# Patient Record
Sex: Female | Born: 1965
Health system: Southern US, Community
[De-identification: ages and names within clinical notes are randomized; demographics above are authoritative.]

## PROBLEM LIST (undated history)

## (undated) DIAGNOSIS — F419 Anxiety disorder, unspecified: Secondary | ICD-10-CM

## (undated) DIAGNOSIS — R519 Headache, unspecified: Secondary | ICD-10-CM

## (undated) DIAGNOSIS — R51 Headache: Secondary | ICD-10-CM

## (undated) DIAGNOSIS — I639 Cerebral infarction, unspecified: Secondary | ICD-10-CM

## (undated) DIAGNOSIS — I10 Essential (primary) hypertension: Secondary | ICD-10-CM

## (undated) DIAGNOSIS — F32A Depression, unspecified: Secondary | ICD-10-CM

## (undated) DIAGNOSIS — Z8619 Personal history of other infectious and parasitic diseases: Secondary | ICD-10-CM

## (undated) DIAGNOSIS — F329 Major depressive disorder, single episode, unspecified: Secondary | ICD-10-CM

## (undated) HISTORY — DX: Headache, unspecified: R51.9

## (undated) HISTORY — PX: FOOT SURGERY: SHX648

## (undated) HISTORY — DX: Anxiety disorder, unspecified: F41.9

## (undated) HISTORY — DX: Major depressive disorder, single episode, unspecified: F32.9

## (undated) HISTORY — DX: Headache: R51

## (undated) HISTORY — DX: Essential (primary) hypertension: I10

## (undated) HISTORY — DX: Personal history of other infectious and parasitic diseases: Z86.19

## (undated) HISTORY — DX: Depression, unspecified: F32.A

---

## 2009-05-02 ENCOUNTER — Ambulatory Visit: Payer: Self-pay | Admitting: Unknown Physician Specialty

## 2009-05-06 ENCOUNTER — Ambulatory Visit: Payer: Self-pay | Admitting: Unknown Physician Specialty

## 2017-03-18 ENCOUNTER — Ambulatory Visit: Payer: Self-pay

## 2017-03-18 NOTE — Telephone Encounter (Signed)
  Reason for Disposition . [1] Symptoms of anxiety or panic AND [2] has not been evaluated for this by physician  Answer Assessment - Initial Assessment Questions 1. CONCERN: "What happened that made you call today?"     PANIC today 2. ANXIETY SYMPTOM SCREENING: "Can you describe how you have been feeling?"  (e.g., tense, restless, panicky, anxious, keyed up, trouble sleeping, trouble concentrating)     Anxiety, shaking 3. ONSET: "How long have you been feeling this way?"    Saturday 4. RECURRENT: "Have you felt this way before?"  If yes: "What happened that time?" "What helped these feelings go away in the past?"      Yes - January 2018 5. RISK OF HARM - SUICIDAL IDEATION:  "Do you ever have thoughts of hurting or killing yourself?"  (e.g., yes, no, no but preoccupation with thoughts about death)   - INTENT:  "Do you have thoughts of hurting or killing yourself right NOW?" (e.g., yes, no, N/A)   - PLAN: "Do you have a specific plan for how you would do this?" (e.g., gun, knife, overdose, no plan, N/A)     No 6. RISK OF HARM - HOMICIDAL IDEATION:  "Do you ever have thoughts of hurting or killing someone else?"  (e.g., yes, no, no but preoccupation with thoughts about death)   - INTENT:  "Do you have thoughts of hurting or killing someone right NOW?" (e.g., yes, no, N/A)   - PLAN: "Do you have a specific plan for how you would do this?" (e.g., gun, knife, no plan, N/A)      No 7. FUNCTIONAL IMPAIRMENT: "How have things been going for you overall in your life? Have you had any more difficulties than usual doing your normal daily activities?"  (e.g., better, same, worse; self-care, school, work, interactions)     Yes 8. SUPPORT: "Who is with you now?" "Who do you live with?" "Do you have family or friends nearby who you can talk to?"      Lives with children 9. THERAPIST: "Do you have a counselor or therapist? Name?"     No one now 10. STRESSORS: "Has there been any new stress or recent  changes in your life?"       Work 11. CAFFEINE ABUSE: "Do you drink caffeinated beverages, and how much each day?" (e.g., coffee, tea, colas)       No 12. SUBSTANCE ABUSE: "Do you use any illegal drugs or alcohol?"       Alcohol occassionally 13. OTHER SYMPTOMS: "Do you have any other physical symptoms right now?" (e.g., chest pain, palpitations, difficulty breathing, fever)       Tremors, crying, not sleeping well 14. PREGNANCY: "Is there any chance you are pregnant?" "When was your last menstrual period?"       No  Protocols used: ANXIETY AND PANIC ATTACK-A-AH

## 2017-03-20 ENCOUNTER — Encounter (INDEPENDENT_AMBULATORY_CARE_PROVIDER_SITE_OTHER): Payer: Self-pay

## 2017-03-20 ENCOUNTER — Telehealth: Payer: Self-pay

## 2017-03-20 ENCOUNTER — Other Ambulatory Visit: Payer: Self-pay | Admitting: Family Medicine

## 2017-03-20 ENCOUNTER — Encounter: Payer: Self-pay | Admitting: Family Medicine

## 2017-03-20 ENCOUNTER — Ambulatory Visit (INDEPENDENT_AMBULATORY_CARE_PROVIDER_SITE_OTHER): Payer: 59 | Admitting: Family Medicine

## 2017-03-20 VITALS — BP 146/92 | HR 110 | Temp 98.1°F | Wt 97.0 lb

## 2017-03-20 DIAGNOSIS — Z7689 Persons encountering health services in other specified circumstances: Secondary | ICD-10-CM | POA: Diagnosis not present

## 2017-03-20 DIAGNOSIS — D582 Other hemoglobinopathies: Secondary | ICD-10-CM

## 2017-03-20 DIAGNOSIS — F329 Major depressive disorder, single episode, unspecified: Secondary | ICD-10-CM | POA: Diagnosis not present

## 2017-03-20 DIAGNOSIS — E876 Hypokalemia: Secondary | ICD-10-CM

## 2017-03-20 DIAGNOSIS — R7989 Other specified abnormal findings of blood chemistry: Secondary | ICD-10-CM

## 2017-03-20 DIAGNOSIS — R232 Flushing: Secondary | ICD-10-CM | POA: Diagnosis not present

## 2017-03-20 DIAGNOSIS — F419 Anxiety disorder, unspecified: Secondary | ICD-10-CM

## 2017-03-20 DIAGNOSIS — R002 Palpitations: Secondary | ICD-10-CM

## 2017-03-20 DIAGNOSIS — R945 Abnormal results of liver function studies: Secondary | ICD-10-CM

## 2017-03-20 DIAGNOSIS — E559 Vitamin D deficiency, unspecified: Secondary | ICD-10-CM

## 2017-03-20 LAB — CBC
HEMATOCRIT: 48.2 % — AB (ref 36.0–46.0)
HEMOGLOBIN: 16.7 g/dL — AB (ref 12.0–15.0)
MCHC: 34.8 g/dL (ref 30.0–36.0)
MCV: 109.6 fl — ABNORMAL HIGH (ref 78.0–100.0)
PLATELETS: 181 10*3/uL (ref 150.0–400.0)
RBC: 4.4 Mil/uL (ref 3.87–5.11)
RDW: 12.6 % (ref 11.5–15.5)
WBC: 8 10*3/uL (ref 4.0–10.5)

## 2017-03-20 LAB — COMPREHENSIVE METABOLIC PANEL
ALT: 88 U/L — ABNORMAL HIGH (ref 0–35)
AST: 222 U/L — ABNORMAL HIGH (ref 0–37)
Albumin: 4.4 g/dL (ref 3.5–5.2)
Alkaline Phosphatase: 150 U/L — ABNORMAL HIGH (ref 39–117)
BILIRUBIN TOTAL: 0.9 mg/dL (ref 0.2–1.2)
BUN: 17 mg/dL (ref 6–23)
CO2: 32 mEq/L (ref 19–32)
Calcium: 9.9 mg/dL (ref 8.4–10.5)
Chloride: 91 mEq/L — ABNORMAL LOW (ref 96–112)
Creatinine, Ser: 0.68 mg/dL (ref 0.40–1.20)
GFR: 96.95 mL/min (ref >60.00)
Glucose, Bld: 128 mg/dL — ABNORMAL HIGH (ref 70–99)
POTASSIUM: 2.7 meq/L — AB (ref 3.5–5.1)
Sodium: 137 mEq/L (ref 135–145)
TOTAL PROTEIN: 7.5 g/dL (ref 6.0–8.3)

## 2017-03-20 LAB — VITAMIN D 25 HYDROXY (VIT D DEFICIENCY, FRACTURES): VITD: 14.32 ng/mL — ABNORMAL LOW (ref 30.00–100.00)

## 2017-03-20 LAB — FOLLICLE STIMULATING HORMONE: FSH: 104.2 m[IU]/mL

## 2017-03-20 LAB — TSH: TSH: 1.99 u[IU]/mL (ref 0.35–4.50)

## 2017-03-20 LAB — VITAMIN B12: VITAMIN B 12: 225 pg/mL (ref 211–911)

## 2017-03-20 MED ORDER — POTASSIUM CHLORIDE CRYS ER 20 MEQ PO TBCR: 20.0000 meq | EXTENDED_RELEASE_TABLET | Freq: Two times a day (BID) | ORAL | 0 refills | Status: DC

## 2017-03-20 MED ORDER — ESCITALOPRAM OXALATE 10 MG PO TABS: 10.0000 mg | ORAL_TABLET | Freq: Every day | ORAL | 2 refills | Status: AC

## 2017-03-20 MED ORDER — CLONAZEPAM 0.5 MG PO TABS: 0.5000 mg | ORAL_TABLET | Freq: Two times a day (BID) | ORAL | 1 refills | Status: AC | PRN

## 2017-03-20 MED ORDER — CHOLECALCIFEROL 125 MCG (5000 UT) PO CAPS: 5000.0000 [IU] | ORAL_CAPSULE | Freq: Every day | ORAL | 5 refills | Status: AC

## 2017-03-20 NOTE — Patient Instructions (Addendum)
It was a pleasure to meet you today! I look forward to partnering with you for your health care needs  Please follow up for complete physical exam in 4-8 weeks, sooner if symptoms worsen   [facility] Rocky LinkKen Frazier-336- [phone] Karmen BongoAaron Stewart- [phone] Heather Kitchen- [phone] Mike CrazeKarla Townsend- [phone] Shanon RosserBarbara Farran- [phone] Berniece AndreasJulie Whitt- [phone] Vernie AmmonsClaire Hubrich [phone] Marjie SkiffLauren Atkinson [phone] Salomon Fickerri Bauert [place]) [phone] [place]- brochure  .  Generalized Anxiety Disorder, Adult Generalized anxiety disorder (GAD) is a mental health disorder. People with this condition constantly worry about everyday events. Unlike normal anxiety, worry related to GAD is not triggered by a specific event. These worries also do not fade or get better with time. GAD interferes with life functions, including relationships, work, and school. GAD can vary from mild to severe. People with severe GAD can have intense waves of anxiety with physical symptoms (panic attacks). What are the causes? The exact cause of GAD is not known. What increases the risk? This condition is more likely to develop in:  Women.  People who have a family history of anxiety disorders.  People who are very shy.  People who experience very stressful life events, such as the death of a loved one.  People who have a very stressful family environment.  What are the signs or symptoms? People with GAD often worry excessively about many things in their lives, such as their health and family. They may also be overly concerned about:  Doing well at work.  Being on time.  Natural disasters.  Friendships.  Physical symptoms of GAD include:  Fatigue.  Muscle tension or having muscle twitches.  Trembling or feeling shaky.  Being easily startled.  Feeling like your heart is pounding or racing.  Feeling out of breath or like you cannot take a deep breath.  Having  trouble falling asleep or staying asleep.  Sweating.  Nausea, diarrhea, or irritable bowel syndrome (IBS).  Headaches.  Trouble concentrating or remembering facts.  Restlessness.  Irritability.  How is this diagnosed? Your health care provider can diagnose GAD based on your symptoms and medical history. You will also have a physical exam. The health care provider will ask specific questions about your symptoms, including how severe they are, when they started, and if they come and go. Your health care provider may ask you about your use of alcohol or drugs, including prescription medicines. Your health care provider may refer you to a mental health specialist for further evaluation. Your health care provider will do a thorough examination and may perform additional tests to rule out other possible causes of your symptoms. To be diagnosed with GAD, a person must have anxiety that:  Is out of his or her control.  Affects several different aspects of his or her life, such as work and relationships.  Causes distress that makes him or her unable to take part in normal activities.  Includes at least three physical symptoms of GAD, such as restlessness, fatigue, trouble concentrating, irritability, muscle tension, or sleep problems.  Before your health care provider can confirm a diagnosis of GAD, these symptoms must be present more days than they are not, and they must last for six months or longer. How is this treated? The following therapies are usually used to treat GAD:  Medicine. Antidepressant medicine is usually prescribed for long-term daily control. Antianxiety medicines may be added in severe cases, especially when panic attacks occur.  Talk therapy (psychotherapy). Certain types of talk therapy can be helpful in  treating GAD by providing support, education, and guidance. Options include: ? Cognitive behavioral therapy (CBT). People learn coping skills and techniques to ease  their anxiety. They learn to identify unrealistic or negative thoughts and behaviors and to replace them with positive ones. ? Acceptance and commitment therapy (ACT). This treatment teaches people how to be mindful as a way to cope with unwanted thoughts and feelings. ? Biofeedback. This process trains you to manage your body's response (physiological response) through breathing techniques and relaxation methods. You will work with a therapist while machines are used to monitor your physical symptoms.  Stress management techniques. These include yoga, meditation, and exercise.  A mental health specialist can help determine which treatment is best for you. Some people see improvement with one type of therapy. However, other people require a combination of therapies. Follow these instructions at home:  Take over-the-counter and prescription medicines only as told by your health care provider.  Try to maintain a normal routine.  Try to anticipate stressful situations and allow extra time to manage them.  Practice any stress management or self-calming techniques as taught by your health care provider.  Do not punish yourself for setbacks or for not making progress.  Try to recognize your accomplishments, even if they are small.  Keep all follow-up visits as told by your health care provider. This is important. Contact a health care provider if:  Your symptoms do not get better.  Your symptoms get worse.  You have signs of depression, such as: ? A persistently sad, cranky, or irritable mood. ? Loss of enjoyment in activities that used to bring you joy. ? Change in weight or eating. ? Changes in sleeping habits. ? Avoiding friends or family members. ? Loss of energy for normal tasks. ? Feelings of guilt or worthlessness. Get help right away if:  You have serious thoughts about hurting yourself or others. If you ever feel like you may hurt yourself or others, or have thoughts about  taking your own life, get help right away. You can go to your nearest emergency department or call:  Your local emergency services (911 in the [place]  A suicide crisis helpline, such as the National Suicide Prevention Lifeline at (469)724-94911-(424)849-0612. This is open 24 hours a day.  Summary  Generalized anxiety disorder (GAD) is a mental health disorder that involves worry that is not triggered by a specific event.  People with GAD often worry excessively about many things in their lives, such as their health and family.  GAD may cause physical symptoms such as restlessness, trouble concentrating, sleep problems, frequent sweating, nausea, diarrhea, headaches, and trembling or muscle twitching.  A mental health specialist can help determine which treatment is best for you. Some people see improvement with one type of therapy. However, other people require a combination of therapies. This information is not intended to replace advice given to you by your health care provider. Make sure you discuss any questions you have with your health care provider. Document Released: 08/11/2012 Document Revised: 03/06/2016 Document Reviewed: 03/06/2016 Elsevier Interactive Patient Education  [employer]2018 Elsevier Inc.
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
## 2017-03-20 NOTE — Telephone Encounter (Signed)
[facility] called with Critical Low Potassium of 2.7

## 2017-03-22 ENCOUNTER — Encounter: Payer: Self-pay | Admitting: Family Medicine

## 2017-03-25 ENCOUNTER — Telehealth: Payer: Self-pay | Admitting: Family Medicine

## 2017-03-25 NOTE — Telephone Encounter (Signed)
Tried calling pt   Paperwork is ready for pick up paperwork has been faxed

## 2017-03-26 ENCOUNTER — Ambulatory Visit: Payer: Self-pay | Admitting: Unknown Physician Specialty

## 2017-03-27 NOTE — Telephone Encounter (Signed)
Copy for pt °Copy for file °Copy for scan °

## 2017-04-01 ENCOUNTER — Telehealth: Payer: Self-pay | Admitting: *Deleted

## 2017-04-01 NOTE — Telephone Encounter (Signed)
Please call patient and tell her that it is imperative that she come in for repeat blood work due to critical values the other week. The orders are already in, please make her a lab only visit for today or tomorrow morning. She will need an appointment to further discuss FMLA extension.

## 2017-04-01 NOTE — Telephone Encounter (Signed)
Copied from CRM [id number]. Topic: Quick Communication - See Telephone Encounter >> Apr 01, 2017 11:24 AM Louie BunPalacios Medina, Rosey Batheresa D wrote: CRM for notification. See Telephone encounter for: 04/01/17. Patient called and would like for provider to extend her FMLA. She would like to talk to someone about this. Please call patient back, thanks.

## 2017-04-01 NOTE — Telephone Encounter (Signed)
Spoken and notified patient of Debbie's comments. Patient verbalized understanding.  Lab appt on 04/02/2017. Office visit on 04/05/2017

## 2017-04-02 ENCOUNTER — Other Ambulatory Visit (INDEPENDENT_AMBULATORY_CARE_PROVIDER_SITE_OTHER): Payer: 59

## 2017-04-02 ENCOUNTER — Other Ambulatory Visit: Payer: 59

## 2017-04-02 DIAGNOSIS — E876 Hypokalemia: Secondary | ICD-10-CM

## 2017-04-02 DIAGNOSIS — R7989 Other specified abnormal findings of blood chemistry: Secondary | ICD-10-CM

## 2017-04-02 DIAGNOSIS — D582 Other hemoglobinopathies: Secondary | ICD-10-CM | POA: Diagnosis not present

## 2017-04-02 DIAGNOSIS — R945 Abnormal results of liver function studies: Secondary | ICD-10-CM

## 2017-04-02 LAB — BASIC METABOLIC PANEL
BUN: 6 mg/dL (ref 6–23)
CALCIUM: 9.5 mg/dL (ref 8.4–10.5)
CO2: 25 mEq/L (ref 19–32)
Chloride: 102 mEq/L (ref 96–112)
Creatinine, Ser: 0.6 mg/dL (ref 0.40–1.20)
GFR: 112 mL/min (ref >60.00)
GLUCOSE: 121 mg/dL — AB (ref 70–99)
Potassium: 4.8 mEq/L (ref 3.5–5.1)
SODIUM: 135 meq/L (ref 135–145)

## 2017-04-02 LAB — IBC PANEL
Iron: 125 ug/dL (ref 42–145)
SATURATION RATIOS: 43.8 % (ref 20.0–50.0)
TRANSFERRIN: 204 mg/dL — AB (ref 212.0–360.0)

## 2017-04-03 NOTE — Addendum Note (Signed)
Addended by: Alvina ChouWALSH, TERRI J on: 04/03/2017 10:20 AM   Modules accepted: Orders

## 2017-04-05 ENCOUNTER — Telehealth: Payer: Self-pay | Admitting: Family Medicine

## 2017-04-05 ENCOUNTER — Ambulatory Visit (INDEPENDENT_AMBULATORY_CARE_PROVIDER_SITE_OTHER): Payer: 59 | Admitting: Family Medicine

## 2017-04-05 ENCOUNTER — Encounter: Payer: Self-pay | Admitting: Family Medicine

## 2017-04-05 VITALS — BP 190/110 | HR 94 | Temp 98.4°F | Wt 100.2 lb

## 2017-04-05 DIAGNOSIS — F419 Anxiety disorder, unspecified: Secondary | ICD-10-CM | POA: Diagnosis not present

## 2017-04-05 DIAGNOSIS — F329 Major depressive disorder, single episode, unspecified: Secondary | ICD-10-CM | POA: Diagnosis not present

## 2017-04-05 DIAGNOSIS — F32A Depression, unspecified: Secondary | ICD-10-CM

## 2017-04-05 DIAGNOSIS — R945 Abnormal results of liver function studies: Secondary | ICD-10-CM

## 2017-04-05 DIAGNOSIS — I10 Essential (primary) hypertension: Secondary | ICD-10-CM

## 2017-04-05 DIAGNOSIS — R7989 Other specified abnormal findings of blood chemistry: Secondary | ICD-10-CM

## 2017-04-05 LAB — TEST AUTHORIZATION

## 2017-04-05 LAB — HEPATIC FUNCTION PANEL
AG RATIO: 1.4 (calc) (ref 1.0–2.5)
ALKALINE PHOSPHATASE (APISO): 125 U/L (ref 33–130)
ALT: 63 U/L — AB (ref 6–29)
AST: 58 U/L — AB (ref 10–35)
Albumin: 3.9 g/dL (ref 3.6–5.1)
Bilirubin, Direct: 0 mg/dL (ref 0.0–0.2)
Globulin: 2.8 g/dL (calc) (ref 1.9–3.7)
Indirect Bilirubin: 0.2 mg/dL (calc) (ref 0.2–1.2)
TOTAL PROTEIN: 6.7 g/dL (ref 6.1–8.1)
Total Bilirubin: 0.2 mg/dL (ref 0.2–1.2)

## 2017-04-05 LAB — REFLEX TIQ

## 2017-04-05 LAB — ACUTE HEP PANEL AND HEP B SURFACE AB
HEP A IGM: NONREACTIVE
HEPATITIS C ANTIBODY REFILL: NONREACTIVE
Hep B C IgM: NONREACTIVE
Hepatitis B Surface Ag: NONREACTIVE
SIGNAL TO CUT-OFF: 0.01 (ref <1.00)

## 2017-04-05 MED ORDER — LISINOPRIL 10 MG PO TABS: 10.0000 mg | ORAL_TABLET | Freq: Every day | ORAL | 3 refills | Status: DC

## 2017-04-05 NOTE — Telephone Encounter (Signed)
[facility] and returned to [facility]ri Bender.

## 2017-04-05 NOTE — Telephone Encounter (Signed)
Noted.    Copied from CRM [id number]. Topic: General - Other >> Apr 05, 2017  2:22 PM Arlyss Gandyichardson, Taren N, NT wrote: Reason for CRM: Patient states she tried to call [facility] and they close at 1pm on Friday so she will call next week to set up an appt.

## 2017-04-05 NOTE — Telephone Encounter (Signed)
Paperwork printed in Terri Bender's in basket   Terri Bender was in today and we are going to extend her FMLA, will you please print out from [employer] and start a telephone encounter?  Thanks,  Ameren CorporationDebbie

## 2017-04-05 NOTE — Patient Instructions (Addendum)
Please call [facility] and tell them you have a referral and need an appointment

## 2017-04-05 NOTE — Progress Notes (Signed)
Subjective:    Patient ID: Terri Bender, female    DOB: [birth year], 51 y.o.   MRN: [id number]  HPI This is a 51 yo female who presents today for follow up of anxiety. She was seen 03/20/17 as a new patient. She had not had any medical care in several years.  She did not get prescriptions for lexapro and vit d3- not sure if prescriptions went to pharmacy? They were sent when she was seen 03/20/17.  She did not schedule appointment with therapist as was advised at last visit.  Today she requests that her FMLA be extended.  She states that she is unable to concentrate and function to be able to do her job.  She reports that she is feeling a little less anxious and is sleeping a little better but feels that she needs additional time to pull herself together.  For a while she was unable to share care of her teenage children with her ex-husband, but has just this week been able to spend time with them.  She has been using clonazepam a couple of times a week with good relief of symptoms.  LFTs found to be very elevated last month. Had been drinking multiple glasses of wine daily about a month and a half ago, she reports she is not currently drinking.   HTN-she has history of hypertension and was previously on medication.  She has been off of it for several years.  She denies headaches, chest pain, pedal edema.  Does feel her heart race some with anxiety.  Past Medical History:  Diagnosis Date  . Anxiety   . Depression   . Frequent headaches   . History of chicken pox   . Hypertension    Past Surgical History:  Procedure Laterality Date  . CESAREAN SECTION    . FOOT SURGERY Left    bone spur   Family History  Problem Relation Age of Onset  . Heart disease Father   . Diabetes Paternal Grandmother   . Heart disease Paternal Grandfather       Review of Systems Per HPI    Objective:   Physical Exam  Constitutional: She is oriented to person, place, and time. She appears well-developed  and well-nourished.  HENT:  Head: Normocephalic and atraumatic.  Eyes: Conjunctivae are normal.  Cardiovascular: Normal rate.  Pulmonary/Chest: Effort normal.  Neurological: She is alert and oriented to person, place, and time.  Skin:  Color improved today.  Psychiatric: Her behavior is normal. Judgment and thought content normal. Her mood appears anxious.  Appears slightly less anxious today, is less tearful.  Vitals reviewed.     BP (!) 198/118 (BP Location: Left Arm, Patient Position: Sitting, Cuff Size: Normal)   Pulse 94   Temp 98.4 F (36.9 C) (Oral)   Wt 100 lb 4 oz (45.5 kg)   SpO2 99%  Wt Readings from Last 3 Encounters:  04/05/17 100 lb 4 oz (45.5 kg)  03/20/17 97 lb (44 kg)   BP Readings from Last 3 Encounters:  04/05/17 (!) 198/118  03/20/17 (!) 146/92  Recheck BP- 190/110    Assessment & Plan:  1. Anxiety and depression -We will call pharmacy to make sure escitalopram went through and encouraged her to pick that up and to start immediately -Told her that I would not continue to write her out for FMLA without her seeing psychiatry, I think she needs additional treatment/counseling. - Ambulatory referral to Psychiatry  2. Essential hypertension - lisinopril (  PRINIVIL,ZESTRIL) 10 MG tablet; Take 1 tablet (10 mg total) by mouth daily.  Dispense: 90 tablet; Refill: 3 -Follow-up in 2 weeks for complete physical exam as scheduled, will recheck blood pressure and BMET at that time  3. Elevated LFTs -Improved on recheck, strongly encouraged her to avoid all alcohol  Olean Reeeborah Kian Ottaviano, FNP-BC  [facility]  04/09/2017 9:46 AM

## 2017-04-09 ENCOUNTER — Encounter: Payer: Self-pay | Admitting: Family Medicine

## 2017-04-10 NOTE — Telephone Encounter (Signed)
Paperwork faxed °

## 2017-04-10 NOTE — Telephone Encounter (Signed)
Tried call pt no voice mail set up

## 2017-04-17 ENCOUNTER — Encounter: Payer: Self-pay | Admitting: Family Medicine

## 2017-04-17 ENCOUNTER — Other Ambulatory Visit (HOSPITAL_COMMUNITY)
Admission: RE | Admit: 2017-04-17 | Discharge: 2017-04-17 | Disposition: A | Discharge status: Home / Self Care | Payer: 59 | Source: Ambulatory Visit | Attending: Family Medicine | Admitting: Family Medicine

## 2017-04-17 ENCOUNTER — Ambulatory Visit (INDEPENDENT_AMBULATORY_CARE_PROVIDER_SITE_OTHER): Payer: 59 | Admitting: Family Medicine

## 2017-04-17 VITALS — BP 118/90 | HR 127 | Temp 97.8°F | Ht 62.0 in | Wt 95.2 lb

## 2017-04-17 DIAGNOSIS — F419 Anxiety disorder, unspecified: Secondary | ICD-10-CM

## 2017-04-17 DIAGNOSIS — Z124 Encounter for screening for malignant neoplasm of cervix: Secondary | ICD-10-CM

## 2017-04-17 DIAGNOSIS — R87610 Atypical squamous cells of undetermined significance on cytologic smear of cervix (ASC-US): Secondary | ICD-10-CM | POA: Insufficient documentation

## 2017-04-17 DIAGNOSIS — I10 Essential (primary) hypertension: Secondary | ICD-10-CM | POA: Diagnosis not present

## 2017-04-17 DIAGNOSIS — F329 Major depressive disorder, single episode, unspecified: Secondary | ICD-10-CM

## 2017-04-17 DIAGNOSIS — Z0000 Encounter for general adult medical examination without abnormal findings: Secondary | ICD-10-CM

## 2017-04-17 NOTE — Progress Notes (Signed)
Subjective:    Patient ID: Terri Bender, female    DOB: 10/22/1965, 51 y.o.   MRN: [id number]  HPI This is a 51 yo female who presents for CPE.    Anxiety and depression- has appointment with therapist 05/01/16- has been delayed due to snow. Has been on leave and this goes until 04/19/17. She requests leave be extended until 05/06/16. Panic attacks have improved, she did have one this morning and her daughter slipped on the stairs.  Her daughter was fine but the patient felt her heart racing and she felt panicky. She was started on escitalopram 03/20/17 but there was some confusion at pharmacy and she didn't pick it up until 04/05/17. She states she is tolerating well. Rarely using clonazepam.   HTN- was started on lisinopril last visit.  She denies any side effects.    Last CPE- a couple of years ago Mammo- never Pap- 2014 Colonoscopy- never Tdap- unknown Flu- declines Eye- 1.5 years ago Dental- not regular Exercise- not regular   Past Medical History:  Diagnosis Date  . Anxiety   . Depression   . Frequent headaches   . History of chicken pox   . Hypertension    Past Surgical History:  Procedure Laterality Date  . CESAREAN SECTION    . FOOT SURGERY Left    bone spur   Family History  Problem Relation Age of Onset  . Heart disease Father   . Diabetes Paternal Grandmother   . Heart disease Paternal Grandfather    Social History   Tobacco Use  . Smoking status: Current Every Day Smoker    Packs/day: 0.50  . Smokeless tobacco: Never Used  Substance Use Topics  . Alcohol use: Yes    Comment: occasional  . Drug use: No     Review of Systems  Constitutional: Negative for fever and unexpected weight change.  HENT: Negative.   Eyes: Negative.   Respiratory: Negative for cough, shortness of breath and wheezing.   Cardiovascular: Positive for palpitations (with panic attacks). Negative for chest pain and leg swelling.  Genitourinary: Negative.   Musculoskeletal:  Positive for back pain (Intermittent).  Allergic/Immunologic: Negative.   Neurological: Negative for dizziness, light-headedness and headaches.  Psychiatric/Behavioral: Positive for dysphoric mood and sleep disturbance. The patient is nervous/anxious.        Objective:   Physical Exam Physical Exam  Constitutional: She is oriented to person, place, and time. She appears well-developed and well-nourished. No distress.  HENT:  Head: Normocephalic and atraumatic.  Right Ear: External ear normal.  Left Ear: External ear normal.  Nose: Nose normal.  Mouth/Throat: Oropharynx is clear and moist. No oropharyngeal exudate.  Eyes: Conjunctivae are normal. Pupils are equal, round, and reactive to light.  Neck: Normal range of motion. Neck supple. No JVD present. No thyromegaly present.  Cardiovascular: Normal rate, regular rhythm, normal heart sounds and intact distal pulses.   Pulmonary/Chest: Effort normal and breath sounds normal. Right breast exhibits no inverted nipple, no mass, no nipple discharge, no skin change and no tenderness. Left breast exhibits no inverted nipple, no mass, no nipple discharge, no skin change and no tenderness. Breasts are symmetrical.  Abdominal: Soft. Bowel sounds are normal. She exhibits no distension and no mass. There is no tenderness. There is no rebound and no guarding.  Genitourinary: Vagina normal. Pelvic exam was performed with patient supine. There is no rash, tenderness, lesion or injury on the right labia. There is no rash, tenderness, lesion or injury  on the left labia. Cervix exhibits no motion tenderness and no discharge. No vaginal discharge found.  Musculoskeletal: Normal range of motion. She exhibits no edema or tenderness.  Lymphadenopathy:    She has no cervical adenopathy.  Neurological: She is alert and oriented to person, place, and time. She has normal reflexes.  Skin: Skin is warm and dry. She is not diaphoretic.  Psychiatric: She has a normal  mood and affect. Her behavior is normal. Judgment and thought content normal. Appears mildly anxious.  Vitals reviewed.     BP 118/90 (BP Location: Right Arm, Patient Position: Sitting, Cuff Size: Normal)   Pulse (!) 127   Temp 97.8 F (36.6 C) (Oral)   Ht 5\' 2"  (1.575 m)   Wt 95 lb 4 oz (43.2 kg)   SpO2 98%   BMI 17.42 kg/m      Wt Readings from Last 3 Encounters:  04/17/17 95 lb 4 oz (43.2 kg)  04/05/17 100 lb 4 oz (45.5 kg)  03/20/17 97 lb (44 kg)   BP Readings from Last 3 Encounters:  04/17/17 118/90  04/05/17 (!) 190/110  03/20/17 (!) 146/92     Assessment & Plan:  1. Annual physical exam - Discussed and encouraged healthy lifestyle choices- adequate sleep, regular exercise, stress management and healthy food choices.   2. Screening for cervical cancer - Cytology - PAP  3. Essential hypertension -Blood pressure significantly improved today, continue lisinopril  4. Anxiety and depression -She is noted some improvement but has appointment with therapist in a couple of weeks, I feel that it is reasonable to extend her FMLA until she can be seen for therapy  -Follow-up in 1 month for repeat BMP and LFTs   Olean Reeeborah Karianna Gusman, FNP-BC  [facility], [facility]  04/17/2017 1:59 PM

## 2017-04-17 NOTE — Patient Instructions (Signed)
Please call and schedule an appointment for screening mammogram. A referral is not needed-  [phone]- [phone]  Please follow up in 1 month to recheck labs and see how you are doing  Keeping You Healthy  Get These Tests  Blood Pressure- Have your blood pressure checked by your healthcare provider at least once a year.  Normal blood pressure is 120/80.  Weight- Have your body mass index (BMI) calculated to screen for obesity.  BMI is a measure of body fat based on height and weight.  You can calculate your own BMI at https://www.west-esparza.com/www.[email or web address]/bmi/  Cholesterol- Have your cholesterol checked every year.  Diabetes- Have your blood sugar checked every year if you have high blood pressure, high cholesterol, a family history of diabetes or if you are overweight.  Pap Test - Have a pap test every 1 to 5 years if you have been sexually active.  If you are older than 65 and recent pap tests have been normal you may not need additional pap tests.  In addition, if you have had a hysterectomy  for benign disease additional pap tests are not necessary.  Mammogram-Yearly mammograms are essential for early detection of breast cancer  Screening for Colon Cancer- Colonoscopy starting at age 51. Screening may begin sooner depending on your family history and other health conditions.  Follow up colonoscopy as directed by your Gastroenterologist.  Screening for Osteoporosis- Screening begins at age 51 with bone density scanning, sooner if you are at higher risk for developing Osteoporosis.  Get these medicines  Calcium with Vitamin D- Your body requires 1200-1500 mg of Calcium a day and [phone] IU of Vitamin D a day.  You can only absorb 500 mg of Calcium at a time therefore Calcium must be taken in 2 or 3 separate doses throughout the day.  Hormones- Hormone therapy has been associated with increased risk for certain cancers and heart disease.  Talk to your healthcare provider about if you need relief from  menopausal symptoms.  Aspirin- Ask your healthcare provider about taking Aspirin to prevent Heart Disease and Stroke.  Get these Immuniztions  Flu shot- Every fall  Pneumonia shot- Once after the age of 51; if you are younger ask your healthcare provider if you need a pneumonia shot.  Tetanus- Every ten years.  Zostavax- Once after the age of 51 to prevent shingles.  Take these steps  Don't smoke- Your healthcare provider can help you quit. For tips on how to quit, ask your healthcare provider or go to www.smokefree.gov or call 1-800 QUIT-NOW.  Be physically active- Exercise 5 days a week for a minimum of 30 minutes.  If you are not already physically active, start slow and gradually work up to 30 minutes of moderate physical activity.  Try walking, dancing, bike riding, swimming, etc.  Eat a healthy diet- Eat a variety of healthy foods such as fruits, vegetables, whole grains, low fat milk, low fat cheeses, yogurt, lean meats, chicken, fish, eggs, dried beans, tofu, etc.  For more information go to www.thenutritionsource.org  Dental visit- Brush and floss teeth twice daily; visit your dentist twice a year.  Eye exam- Visit your Optometrist or Ophthalmologist yearly.  Drink alcohol in moderation- Limit alcohol intake to one drink or less a day.  Never drink and drive.  Depression- Your emotional health is as important as your physical health.  If you're feeling down or losing interest in things you normally enjoy, please talk to your healthcare provider.  Seat Belts- can  save your life; always wear one  Smoke/Carbon Monoxide detectors- These detectors need to be installed on the appropriate level of your home.  Replace batteries at least once a year.  Violence- If anyone is threatening or hurting you, please tell your healthcare provider.  Living Will/ Health care power of attorney- Discuss with your healthcare provider and family.

## 2017-04-22 LAB — CYTOLOGY - PAP
DIAGNOSIS: UNDETERMINED — AB
HPV 16/18/45 GENOTYPING: POSITIVE — AB
HPV: DETECTED — AB

## 2017-04-24 ENCOUNTER — Other Ambulatory Visit: Payer: Self-pay | Admitting: Family Medicine

## 2017-04-24 DIAGNOSIS — R87610 Atypical squamous cells of undetermined significance on cytologic smear of cervix (ASC-US): Secondary | ICD-10-CM

## 2017-04-24 DIAGNOSIS — R87810 Cervical high risk human papillomavirus (HPV) DNA test positive: Principal | ICD-10-CM

## 2017-05-01 ENCOUNTER — Ambulatory Visit: Payer: Self-pay | Admitting: Psychiatry

## 2017-05-08 ENCOUNTER — Encounter: Payer: Self-pay | Admitting: Obstetrics and Gynecology

## 2017-05-08 ENCOUNTER — Ambulatory Visit (INDEPENDENT_AMBULATORY_CARE_PROVIDER_SITE_OTHER): Payer: 59 | Admitting: Obstetrics and Gynecology

## 2017-05-08 VITALS — BP 100/60 | HR 107 | Ht 62.0 in | Wt 92.0 lb

## 2017-05-08 DIAGNOSIS — R87810 Cervical high risk human papillomavirus (HPV) DNA test positive: Secondary | ICD-10-CM

## 2017-05-08 DIAGNOSIS — F17200 Nicotine dependence, unspecified, uncomplicated: Secondary | ICD-10-CM

## 2017-05-08 DIAGNOSIS — R87610 Atypical squamous cells of undetermined significance on cytologic smear of cervix (ASC-US): Secondary | ICD-10-CM

## 2017-05-08 NOTE — Progress Notes (Signed)
   GYNECOLOGY CLINIC COLPOSCOPY PROCEDURE NOTE  52 y.o. [id number] here for colposcopy for ASCUS with POSITIVE high risk HPV pap smear on 04/17/17. Discussed underlying role for HPV infection in the development of cervical dysplasia, its natural history and progression/regression, need for surveillance.  Is the patient  pregnant: No LMP: No LMP recorded. Patient is postmenopausal. Smoking status: Current everyday smoker, reports she has cut back.  Contraception: None Number current sexual partners:  1 Number of partners in lifetime:  Less than 10 Future fertility desired:  No  Patient given informed consent, signed copy in the chart, time out was performed.  The patient was position in dorsal lithotomy position. Speculum was placed the cervix was visualized.   After application of acetic acid colposcopic inspection of the cervix was undertaken.   Colposcopy adequate, full visualization of transformation zone: Yes punctation noted at 12 and 6 o'clock; corresponding biopsies obtained.   ECC specimen obtained:  Yes  All specimens were labeled and sent to pathology.   Patient was given post procedure instructions.  Will follow up pathology and manage accordingly.  Routine preventative health maintenance measures emphasized. Encouraged patient to stop smoking.  Physical Exam  Constitutional: She appears well-developed.  Genitourinary:    Cardiovascular: Normal rate.  Abdominal: Soft.  Skin: Skin is warm and dry.    Adelene Idlerhristanna Cacie Gaskins MD [facility], [facility]

## 2017-05-08 NOTE — Patient Instructions (Signed)
Colposcopy, Care After  This sheet gives you information about how to care for yourself after your procedure. Your doctor may also give you more specific instructions. If you have problems or questions, contact your doctor.  What can I expect after the procedure?  If you did not have a tissue sample removed (did not have a biopsy), you may only have some spotting for a few days. You can go back to your normal activities.  If you had a tissue sample removed, it is common to have:  · Soreness and pain. This may last for a few days.  · Light-headedness.  · Mild bleeding from your vagina or dark-colored, grainy discharge from your vagina. This may last for a few days. You may need to wear a sanitary pad.  · Spotting for at least 48 hours after the procedure.    Follow these instructions at home:  · Take over-the-counter and prescription medicines only as told by your doctor. Ask your doctor what medicines you can start taking again. This is very important if you take blood-thinning medicine.  · Do not drive or use heavy machinery while taking prescription pain medicine.  · For 3 days, or as long as your doctor tells you, avoid:  ? Douching.  ? Using tampons.  ? Having sex.  · If you use birth control (contraception), keep using it.  · Limit activity for the first day after the procedure. Ask your doctor what activities are safe for you.  · It is up to you to get the results of your procedure. Ask your doctor when your results will be ready.  · Keep all follow-up visits as told by your doctor. This is important.  Contact a doctor if:  · You get a skin rash.  Get help right away if:  · You are bleeding a lot from your vagina. It is a lot of bleeding if you are using more than one pad an hour for 2 hours in a row.  · You have clumps of blood (blood clots) coming from your vagina.  · You have a fever.  · You have chills  · You have pain in your lower belly (pelvic area).  · You have signs of infection, such as vaginal  discharge that is:  ? Different than usual.  ? Yellow.  ? Bad-smelling.  · You have very pain or cramps in your lower belly that do not get better with medicine.  · You feel light-headed.  · You feel dizzy.  · You pass out (faint).  Summary  · If you did not have a tissue sample removed (did not have a biopsy), you may only have some spotting for a few days. You can go back to your normal activities.  · If you had a tissue sample removed, it is common to have mild pain and spotting for 48 hours.  · For 3 days, or as long as your doctor tells you, avoid douching, using tampons and having sex.  · Get help right away if you have bleeding, very bad pain, or signs of infection.  This information is not intended to replace advice given to you by your health care provider. Make sure you discuss any questions you have with your health care provider.  Document Released: 10/03/2007 Document Revised: 01/04/2016 Document Reviewed: 01/04/2016  Elsevier Interactive Patient Education © 2018 Elsevier Inc.

## 2017-05-10 LAB — PATHOLOGY

## 2017-05-11 ENCOUNTER — Emergency Department: Payer: 59

## 2017-05-11 ENCOUNTER — Other Ambulatory Visit: Payer: Self-pay

## 2017-05-11 ENCOUNTER — Inpatient Hospital Stay
Admission: EM | Admit: 2017-05-11 | Discharge: 2017-05-16 | DRG: 064 | Disposition: A | Discharge status: Skilled Nursing Facility | Payer: 59 | Attending: Specialist | Admitting: Specialist

## 2017-05-11 ENCOUNTER — Encounter: Payer: Self-pay | Admitting: Emergency Medicine

## 2017-05-11 DIAGNOSIS — E876 Hypokalemia: Secondary | ICD-10-CM | POA: Diagnosis present

## 2017-05-11 DIAGNOSIS — F17210 Nicotine dependence, cigarettes, uncomplicated: Secondary | ICD-10-CM | POA: Diagnosis present

## 2017-05-11 DIAGNOSIS — E739 Lactose intolerance, unspecified: Secondary | ICD-10-CM | POA: Diagnosis present

## 2017-05-11 DIAGNOSIS — Z79899 Other long term (current) drug therapy: Secondary | ICD-10-CM | POA: Diagnosis not present

## 2017-05-11 DIAGNOSIS — I63411 Cerebral infarction due to embolism of right middle cerebral artery: Secondary | ICD-10-CM | POA: Diagnosis not present

## 2017-05-11 DIAGNOSIS — F1010 Alcohol abuse, uncomplicated: Secondary | ICD-10-CM | POA: Diagnosis present

## 2017-05-11 DIAGNOSIS — D72829 Elevated white blood cell count, unspecified: Secondary | ICD-10-CM

## 2017-05-11 DIAGNOSIS — F419 Anxiety disorder, unspecified: Secondary | ICD-10-CM | POA: Diagnosis present

## 2017-05-11 DIAGNOSIS — Z881 Allergy status to other antibiotic agents status: Secondary | ICD-10-CM

## 2017-05-11 DIAGNOSIS — R748 Abnormal levels of other serum enzymes: Secondary | ICD-10-CM | POA: Diagnosis present

## 2017-05-11 DIAGNOSIS — G936 Cerebral edema: Secondary | ICD-10-CM | POA: Diagnosis present

## 2017-05-11 DIAGNOSIS — I610 Nontraumatic intracerebral hemorrhage in hemisphere, subcortical: Secondary | ICD-10-CM | POA: Diagnosis present

## 2017-05-11 DIAGNOSIS — F17200 Nicotine dependence, unspecified, uncomplicated: Secondary | ICD-10-CM | POA: Diagnosis present

## 2017-05-11 DIAGNOSIS — N390 Urinary tract infection, site not specified: Secondary | ICD-10-CM | POA: Diagnosis present

## 2017-05-11 DIAGNOSIS — I63511 Cerebral infarction due to unspecified occlusion or stenosis of right middle cerebral artery: Secondary | ICD-10-CM | POA: Diagnosis not present

## 2017-05-11 DIAGNOSIS — R7989 Other specified abnormal findings of blood chemistry: Secondary | ICD-10-CM

## 2017-05-11 DIAGNOSIS — R29714 NIHSS score 14: Secondary | ICD-10-CM | POA: Diagnosis present

## 2017-05-11 DIAGNOSIS — F329 Major depressive disorder, single episode, unspecified: Secondary | ICD-10-CM | POA: Diagnosis present

## 2017-05-11 DIAGNOSIS — G8194 Hemiplegia, unspecified affecting left nondominant side: Secondary | ICD-10-CM | POA: Diagnosis present

## 2017-05-11 DIAGNOSIS — I639 Cerebral infarction, unspecified: Secondary | ICD-10-CM | POA: Diagnosis present

## 2017-05-11 DIAGNOSIS — I10 Essential (primary) hypertension: Secondary | ICD-10-CM | POA: Diagnosis present

## 2017-05-11 DIAGNOSIS — X58XXXA Exposure to other specified factors, initial encounter: Secondary | ICD-10-CM | POA: Diagnosis present

## 2017-05-11 DIAGNOSIS — R29810 Facial weakness: Secondary | ICD-10-CM | POA: Diagnosis present

## 2017-05-11 DIAGNOSIS — Z882 Allergy status to sulfonamides status: Secondary | ICD-10-CM

## 2017-05-11 DIAGNOSIS — W19XXXA Unspecified fall, initial encounter: Secondary | ICD-10-CM | POA: Diagnosis present

## 2017-05-11 DIAGNOSIS — R414 Neurologic neglect syndrome: Secondary | ICD-10-CM | POA: Diagnosis present

## 2017-05-11 DIAGNOSIS — R471 Dysarthria and anarthria: Secondary | ICD-10-CM | POA: Diagnosis present

## 2017-05-11 DIAGNOSIS — R778 Other specified abnormalities of plasma proteins: Secondary | ICD-10-CM

## 2017-05-11 LAB — DIFFERENTIAL
Basophils Absolute: 0 10*3/uL (ref 0–0.1)
Basophils Relative: 0 %
EOS PCT: 0 %
Eosinophils Absolute: 0 10*3/uL (ref 0–0.7)
LYMPHS ABS: 1.5 10*3/uL (ref 1.0–3.6)
LYMPHS PCT: 11 %
MONO ABS: 1.5 10*3/uL — AB (ref 0.2–0.9)
Monocytes Relative: 11 %
NEUTROS ABS: 10.8 10*3/uL — AB (ref 1.4–6.5)
Neutrophils Relative %: 78 %

## 2017-05-11 LAB — CBC
HEMATOCRIT: 47.6 % — AB (ref 35.0–47.0)
HEMOGLOBIN: 16.5 g/dL — AB (ref 12.0–16.0)
MCH: 40 pg — ABNORMAL HIGH (ref 26.0–34.0)
MCHC: 34.7 g/dL (ref 32.0–36.0)
MCV: 115.5 fL — ABNORMAL HIGH (ref 80.0–100.0)
PLATELETS: 322 10*3/uL (ref 150–440)
RBC: 4.12 MIL/uL (ref 3.80–5.20)
RDW: 15.9 % — ABNORMAL HIGH (ref 11.5–14.5)
WBC: 13.8 10*3/uL — AB (ref 3.6–11.0)

## 2017-05-11 LAB — COMPREHENSIVE METABOLIC PANEL
ALBUMIN: 4.4 g/dL (ref 3.5–5.0)
ALK PHOS: 74 U/L (ref 38–126)
ALT: 30 U/L (ref 14–54)
ANION GAP: 14 (ref 5–15)
AST: 80 U/L — ABNORMAL HIGH (ref 15–41)
BILIRUBIN TOTAL: 1.2 mg/dL (ref 0.3–1.2)
BUN: 17 mg/dL (ref 6–20)
CALCIUM: 10.3 mg/dL (ref 8.9–10.3)
CO2: 25 mmol/L (ref 22–32)
CREATININE: 0.63 mg/dL (ref 0.44–1.00)
Chloride: 97 mmol/L — ABNORMAL LOW (ref 101–111)
GFR calc non Af Amer: >60 mL/min (ref >60)
GLUCOSE: 117 mg/dL — AB (ref 65–99)
Potassium: 4.2 mmol/L (ref 3.5–5.1)
SODIUM: 136 mmol/L (ref 135–145)
TOTAL PROTEIN: 7.7 g/dL (ref 6.5–8.1)

## 2017-05-11 LAB — PROTIME-INR
INR: 0.92
Prothrombin Time: 12.3 seconds (ref 11.4–15.2)

## 2017-05-11 LAB — ETHANOL: Alcohol, Ethyl (B): <10 mg/dL (ref <10)

## 2017-05-11 LAB — APTT: aPTT: 28 seconds (ref 24–36)

## 2017-05-11 LAB — TROPONIN I: Troponin I: 0.12 ng/mL (ref <0.03)

## 2017-05-11 LAB — CK: Total CK: 290 U/L — ABNORMAL HIGH (ref 38–234)

## 2017-05-11 MED ORDER — METOPROLOL TARTRATE 5 MG/5ML IV SOLN
5.0000 mg | INTRAVENOUS | Status: DC | PRN
  Administered 2017-05-11: 5 mg via INTRAVENOUS
  Filled 2017-05-11: qty 5

## 2017-05-11 MED ORDER — LORAZEPAM 1 MG PO TABS: 1.0000 mg | ORAL_TABLET | Freq: Four times a day (QID) | ORAL | Status: AC | PRN

## 2017-05-11 MED ORDER — SODIUM CHLORIDE 0.9 % IV SOLN: Freq: Once | INTRAVENOUS | Status: DC

## 2017-05-11 MED ORDER — IPRATROPIUM-ALBUTEROL 0.5-2.5 (3) MG/3ML IN SOLN: 3.0000 mL | RESPIRATORY_TRACT | Status: DC | PRN

## 2017-05-11 MED ORDER — THIAMINE HCL 100 MG/ML IJ SOLN
100.0000 mg | Freq: Every day | INTRAMUSCULAR | Status: DC
  Filled 2017-05-11 (×2): qty 2

## 2017-05-11 MED ORDER — METOPROLOL TARTRATE 5 MG/5ML IV SOLN: 5.0000 mg | INTRAVENOUS | Status: DC | PRN

## 2017-05-11 MED ORDER — VITAMIN B-1 100 MG PO TABS
100.0000 mg | ORAL_TABLET | Freq: Every day | ORAL | Status: DC
  Administered 2017-05-14 – 2017-05-16 (×3): 100 mg via ORAL
  Filled 2017-05-11 (×3): qty 1

## 2017-05-11 MED ORDER — LORAZEPAM 2 MG/ML IJ SOLN: 1.0000 mg | Freq: Four times a day (QID) | INTRAMUSCULAR | Status: AC | PRN

## 2017-05-11 MED ORDER — SODIUM CHLORIDE 0.9 % IV BOLUS (SEPSIS)
1000.0000 mL | Freq: Once | INTRAVENOUS | Status: AC
  Administered 2017-05-11: 1000 mL via INTRAVENOUS

## 2017-05-11 MED ORDER — ASPIRIN 81 MG PO CHEW
CHEWABLE_TABLET | ORAL | Status: AC
  Filled 2017-05-11: qty 4

## 2017-05-11 MED ORDER — SODIUM CHLORIDE 0.9 % IV SOLN
25.0000 mg | Freq: Once | INTRAVENOUS | Status: AC
  Administered 2017-05-12: 25 mg via INTRAVENOUS
  Filled 2017-05-11: qty 0.5

## 2017-05-11 MED ORDER — ACETAMINOPHEN 325 MG PO TABS
650.0000 mg | ORAL_TABLET | ORAL | Status: DC | PRN
  Administered 2017-05-13 – 2017-05-16 (×7): 650 mg via ORAL
  Filled 2017-05-11 (×7): qty 2

## 2017-05-11 MED ORDER — ACETAMINOPHEN 160 MG/5ML PO SOLN
650.0000 mg | ORAL | Status: DC | PRN
  Filled 2017-05-11: qty 20.3

## 2017-05-11 MED ORDER — ASPIRIN EC 325 MG PO TBEC
325.0000 mg | DELAYED_RELEASE_TABLET | Freq: Every day | ORAL | Status: DC
  Administered 2017-05-14 – 2017-05-16 (×3): 325 mg via ORAL
  Filled 2017-05-11 (×3): qty 1

## 2017-05-11 MED ORDER — ACETAMINOPHEN 650 MG RE SUPP
650.0000 mg | RECTAL | Status: DC | PRN
  Administered 2017-05-12: 650 mg via RECTAL
  Filled 2017-05-11: qty 1

## 2017-05-11 MED ORDER — ENOXAPARIN SODIUM 30 MG/0.3ML ~~LOC~~ SOLN
30.0000 mg | SUBCUTANEOUS | Status: DC
  Administered 2017-05-11 – 2017-05-15 (×5): 30 mg via SUBCUTANEOUS
  Filled 2017-05-11 (×5): qty 0.3

## 2017-05-11 MED ORDER — ADULT MULTIVITAMIN W/MINERALS CH
1.0000 | ORAL_TABLET | Freq: Every day | ORAL | Status: DC
  Administered 2017-05-14 – 2017-05-16 (×3): 1 via ORAL
  Filled 2017-05-11 (×3): qty 1

## 2017-05-11 MED ORDER — FOLIC ACID 1 MG PO TABS
1.0000 mg | ORAL_TABLET | Freq: Every day | ORAL | Status: DC
  Administered 2017-05-14 – 2017-05-16 (×3): 1 mg via ORAL
  Filled 2017-05-11 (×3): qty 1

## 2017-05-11 MED ORDER — STROKE: EARLY STAGES OF RECOVERY BOOK
Freq: Once | Status: AC
  Administered 2017-05-11: 23:00:00

## 2017-05-11 MED ORDER — ASPIRIN 300 MG RE SUPP
300.0000 mg | Freq: Every day | RECTAL | Status: DC
  Administered 2017-05-12 – 2017-05-13 (×2): 300 mg via RECTAL
  Filled 2017-05-11 (×3): qty 1

## 2017-05-11 MED ORDER — SODIUM CHLORIDE 0.9 % IV SOLN
INTRAVENOUS | Status: DC
  Administered 2017-05-11 – 2017-05-15 (×4): via INTRAVENOUS

## 2017-05-11 NOTE — ED Notes (Signed)
Attempted report x1, nurse is not available

## 2017-05-11 NOTE — ED Notes (Signed)
Patient transported to CT 

## 2017-05-11 NOTE — ED Provider Notes (Signed)
[facility] Emergency Department Provider Note  ____________________________________________  Time seen: Approximately 7:12 PM  I have reviewed the triage vital signs and the nursing notes.   HISTORY  Chief Complaint Fall  Level 5 Caveat: Portions of the History and Physical are unable to be obtained due to patient being a poor historian   HPI DERENDA GIDDINGS is a 52 y.o. female reports falling onto the floor a few days ago. She can't remember exactly when. Denies any pain. States she fell because both legs felt weak. She is not sure of the onset or if it was gradual or sudden.  Review of electronic medical record shows that she was in colposcopy clinic 3 days ago without symptoms at that time.     Past Medical History:  Diagnosis Date  . Anxiety   . Depression   . Frequent headaches   . History of chicken pox   . Hypertension      Patient Active Problem List   Diagnosis Date Noted  . Smoker 05/08/2017     Past Surgical History:  Procedure Laterality Date  . CESAREAN SECTION    . FOOT SURGERY Left    bone spur     Prior to Admission medications   Medication Sig Start Date End Date Taking? Authorizing Provider  Cholecalciferol 5000 units capsule Take 1 capsule (5,000 Units total) by mouth daily. 03/20/17  Yes Emi Belfast, FNP  clonazePAM (KLONOPIN) 0.5 MG tablet Take 1 tablet (0.5 mg total) by mouth 2 (two) times daily as needed for anxiety. 03/20/17  Yes Emi Belfast, FNP  escitalopram (LEXAPRO) 10 MG tablet Take 1 tablet (10 mg total) by mouth daily. 03/20/17  Yes Emi Belfast, FNP  lisinopril (PRINIVIL,ZESTRIL) 10 MG tablet Take 1 tablet (10 mg total) by mouth daily. 04/05/17  Yes Emi Belfast, FNP  ibuprofen (ADVIL,MOTRIN) 200 MG tablet Take 800 mg by mouth 2 (two) times daily as needed.    [provider]  potassium chloride SA (K-DUR,KLOR-CON) 20 MEQ tablet Take 1 tablet (20 mEq total) by mouth 2 (two)  times daily. Patient not taking: Reported on 05/08/2017 03/20/17   Emi Belfast, FNP     Allergies Erythromycin and Septra [sulfamethoxazole-trimethoprim]   Family History  Problem Relation Age of Onset  . Heart disease Father   . Diabetes Paternal Grandmother   . Heart disease Paternal Grandfather     Social History Social History   Tobacco Use  . Smoking status: Current Every Day Smoker    Packs/day: 0.50  . Smokeless tobacco: Never Used  Substance Use Topics  . Alcohol use: Yes    Comment: occasional  . Drug use: No    Review of Systems  Constitutional:   No fever or chills.  ENT:   No sore throat. No rhinorrhea. Cardiovascular:   No chest pain or syncope. Respiratory:   No dyspnea or cough. Gastrointestinal:   Negative for abdominal pain, vomiting and diarrhea.  Musculoskeletal:   Negative for focal pain or swelling All other systems reviewed and are negative except as documented above in ROS and HPI.  ____________________________________________   PHYSICAL EXAM:  VITAL SIGNS: ED Triage Vitals  Enc Vitals Group     BP --      Pulse Rate 05/11/17 1817 84     Resp 05/11/17 1817 18     Temp 05/11/17 1817 97.8 F (36.6 C)     Temp Source 05/11/17 1817 Oral  SpO2 05/11/17 1810 100 %     Weight --      Height --      Head Circumference --      Peak Flow --      Pain Score 05/11/17 1817 5     Pain Loc --      Pain Edu? --      Excl. in GC? --     Vital signs reviewed, nursing assessments reviewed.   Constitutional:   Alert and oriented. Not in distress Eyes:   No scleral icterus.  EOMI. No nystagmus. No conjunctival pallor. PERRL. ENT   Head:   Normocephalic and atraumatic.   Nose:   No congestion/rhinnorhea.    Mouth/Throat:   MMM, no pharyngeal erythema. No peritonsillar mass.    Neck:   No meningismus. Full ROM. Hematological/Lymphatic/Immunilogical:   No cervical lymphadenopathy. Cardiovascular:   RRR. Symmetric bilateral  radial and DP pulses.  No murmurs.  Respiratory:   Normal respiratory effort without tachypnea/retractions. Breath sounds are clear and equal bilaterally. No wheezes/rales/rhonchi. Gastrointestinal:   Soft and nontender. Non distended. There is no CVA tenderness.  No rebound, rigidity, or guarding. Genitourinary:   deferred Musculoskeletal:   Normal range of motion in all extremities. No joint effusions.  No lower extremity tenderness.  No edema. Neurologic:   Normal speech and language Left facial droop, cranial nerves II through XII otherwise intact.  Paralysis of left arm and left leg with some increased tone. Diminished sensation of left arm and left leg NIH STROKE SCALE = 11  Skin:    Skin is warm, dry and intact. No rash noted.  No petechiae, purpura, or bullae.  ____________________________________________    LABS (pertinent positives/negatives) (all labs ordered are listed, but only abnormal results are displayed) Labs Reviewed  CBC - Abnormal; Notable for the following components:      Result Value   WBC 13.8 (*)    Hemoglobin 16.5 (*)    HCT 47.6 (*)    MCV 115.5 (*)    MCH 40.0 (*)    RDW 15.9 (*)    All other components within normal limits  DIFFERENTIAL - Abnormal; Notable for the following components:   Neutro Abs 10.8 (*)    Monocytes Absolute 1.5 (*)    All other components within normal limits  COMPREHENSIVE METABOLIC PANEL - Abnormal; Notable for the following components:   Chloride 97 (*)    Glucose, Bld 117 (*)    AST 80 (*)    All other components within normal limits  TROPONIN I - Abnormal; Notable for the following components:   Troponin I 0.12 (*)    All other components within normal limits  CK - Abnormal; Notable for the following components:   Total CK 290 (*)    All other components within normal limits  ETHANOL  PROTIME-INR  APTT  URINE DRUG SCREEN, QUALITATIVE ([facility] ONLY)  URINALYSIS, COMPLETE (UACMP) WITH MICROSCOPIC    ____________________________________________   EKG Interpreted by me Sinus rhythm rate of 84, normal axis and intervals. Normal QRS ST segments and T waves.   ____________________________________________    RADIOLOGY  Ct Head Wo Contrast  Result Date: 05/11/2017 CLINICAL DATA:  Left body flaccidity. Found on the floor at home after not being seen for 2 days. EXAM: CT HEAD WITHOUT CONTRAST TECHNIQUE: Contiguous axial images were obtained from the base of the skull through the vertex without intravenous contrast. COMPARISON:  None. FINDINGS: Brain: Large area of white matter and gray  matter low density in a right middle cerebral artery distribution, including the basal ganglia, with associated mass effect on the right lateral ventricle without midline shift. No intracranial hemorrhage. There is also diffuse pontine low density. Vascular: Hyperdense right middle cerebral artery. Skull: Normal. Negative for fracture or focal lesion. Sinuses/Orbits: Unremarkable. Other: None. IMPRESSION: 1. Occluded right middle cerebral artery with a large subacute infarct in the right middle cerebral artery distribution. 2. Associated mass effect on the right lateral ventricle without midline shift. 3. Diffuse low density in the pons. This is most likely normal. Diffuse ischemic change is less likely. These results were called by telephone at the time of interpretation on 05/11/2017 at 7:11 pm to Dr. Sharman Cheek , who verbally acknowledged these results. Electronically Signed   By: Beckie Salts M.D.   On: 05/11/2017 19:13    ____________________________________________   PROCEDURES Procedures  ____________________________________________  DIFFERENTIAL DIAGNOSIS Intracranial hemorrhage, ischemic stroke  CLINICAL IMPRESSION / ASSESSMENT AND PLAN / ED COURSE  Pertinent labs & imaging results that were available during my care of the patient were reviewed by me and considered in my medical decision  making (see chart for details).  Patient presents with left-sided paralysis, likely right MCA stroke. Unclear onset but seems to have been several days and patient is not a candidate for TPA or endovascular intervention. CT does show a subacute MCA territory infarct. Patient has a slightly elevated troponin and CK which I think are due to being immobile on the floor for the past few days with this. Given IV fluids for hydration, aspirin, plan to admit for further stroke management.       ____________________________________________   FINAL CLINICAL IMPRESSION(S) / ED DIAGNOSES    Final diagnoses:  Acute ischemic right MCA stroke (HCC)  Elevated troponin       Portions of this note were generated with dragon dictation software. Dictation errors may occur despite best attempts at proofreading.    Sharman Cheek, MD 05/11/17 [phone]

## 2017-05-11 NOTE — Progress Notes (Signed)
PHARMACIST - PHYSICIAN COMMUNICATION  CONCERNING:  Enoxaparin (Lovenox) for DVT Prophylaxis    RECOMMENDATION: Patient was prescribed enoxaprin 40mg  q24 hours for VTE prophylaxis.   Estimated Creatinine Clearance: 54.8 mL/min (by C-G formula based on SCr of 0.63 mg/dL).   Weight: 41.7 kg   Patient is candidate for enoxaparin 30mg  every 24 hours based on  Weight less then 45kg for female   DESCRIPTION: Pharmacy has adjusted enoxaparin dose.  Patient is now receiving enoxaparin 30mg  every 24 hours.  Gardner CandleSheema M Klarisa Barman, PharmD, BCPS Clinical Pharmacist 05/11/2017 9:40 PM

## 2017-05-11 NOTE — ED Notes (Signed)
RN notified patient was on the way up and patient was unable to void.  Judeth CornfieldStephanie and [place] Island[place] EDT taking patient upstairs.

## 2017-05-11 NOTE — H&P (Addendum)
PCP:   Emi Belfast, FNP   Chief Complaint:    HPI: this is a 51y/o female on Monday had decreased mobility b/l LE weakness.   No one has been able to contact patient since Wednesday.   Today daughter went to the apt and found patient on the floor in her bedroom upstairs. She was awake and responsive but was unable to get up off floor.  Her memory is sketchy, she states she does not remember anything since Monday but per family members she had a cervical biopsy done Wednesday. Which she deescribed in detail to family member earlier. She does not recall that currently  Patient states she drink somewhat every other day. No withdrawal. Family members provides note stating patient drinks daily.  History provided by patient and family members present at bedside.  Review of Systems:  The patient denies anorexia, fever, weight loss,, vision loss, decreased hearing, hoarseness, chest pain, syncope, dyspnea on exertion, peripheral edema, balance deficits, hemoptysis, abdominal pain, melena, hematochezia, severe indigestion/heartburn, hematuria, incontinence, genital sores, muscle weakness, suspicious skin lesions, transient blindness, difficulty walking, depression, unusual weight change, abnormal bleeding, enlarged lymph nodes, angioedema, and breast masses.   Past Medical History: Past Medical History:  Diagnosis Date  . Anxiety   . Depression   . Frequent headaches   . History of chicken pox   . Hypertension    Past Surgical History:  Procedure Laterality Date  . CESAREAN SECTION    . FOOT SURGERY Left    bone spur  Tubal ligation, Csexn  Medications: Prior to Admission medications   Medication Sig Start Date End Date Taking? Authorizing Provider  Cholecalciferol 5000 units capsule Take 1 capsule (5,000 Units total) by mouth daily. 03/20/17  Yes Emi Belfast, FNP  clonazePAM (KLONOPIN) 0.5 MG tablet Take 1 tablet (0.5 mg total) by mouth 2 (two) times daily as needed  for anxiety. 03/20/17  Yes Emi Belfast, FNP  escitalopram (LEXAPRO) 10 MG tablet Take 1 tablet (10 mg total) by mouth daily. 03/20/17  Yes Emi Belfast, FNP  lisinopril (PRINIVIL,ZESTRIL) 10 MG tablet Take 1 tablet (10 mg total) by mouth daily. 04/05/17  Yes Emi Belfast, FNP  ibuprofen (ADVIL,MOTRIN) 200 MG tablet Take 800 mg by mouth 2 (two) times daily as needed.    [provider]  potassium chloride SA (K-DUR,KLOR-CON) 20 MEQ tablet Take 1 tablet (20 mEq total) by mouth 2 (two) times daily. Patient not taking: Reported on 05/08/2017 03/20/17   Emi Belfast, FNP    Allergies:   Allergies  Allergen Reactions  . Erythromycin Nausea And Vomiting  . Septra [Sulfamethoxazole-Trimethoprim] Hives    Social History:  reports that she has been smoking.  She has been smoking about 0.50 packs per day. she has never used smokeless tobacco. She reports that she drinks alcohol. She reports that she does not use drugs.  Family History: Family History  Problem Relation Age of Onset  . Heart disease Father   . Diabetes Paternal Grandmother   . Heart disease Paternal Grandfather     Physical Exam: Vitals:   05/11/17 1810 05/11/17 1817 05/11/17 1829  Pulse:  84   Resp:  18   Temp:  97.8 F (36.6 C) 97.8 F (36.6 C)  TempSrc:  Oral   SpO2: 100% 100%     General:  Alert and oriented times three, well developed and nourished, no acute distress Eyes: PERRLA, pink conjunctiva, no scleral icterus ENT: dry oral mucosa, neck  supple, no thyromegaly Lungs: clear to ascultation, no wheeze, no crackles, no use of accessory muscles Cardiovascular: regular rate and rhythm, no regurgitation, no gallops, no murmurs. No carotid bruits, no JVD Abdomen: soft, positive BS, non-tender, non-distended, no organomegaly, not an acute abdomen GU: not examined Neuro: tongue deviates to right. Smiles only on the left. Strength LUE 0/5 with contraction of hands, LLE  1/5 Musculoskeletal: strength 5/5 all extremities, no clubbing, cyanosis or edema Skin: no rash, no subcutaneous crepitation, no decubitus Psych: appropriate patient   Labs on Admission:  Recent Labs    05/11/17 1816  NA 136  K 4.2  CL 97*  CO2 25  GLUCOSE 117*  BUN 17  CREATININE 0.63  CALCIUM 10.3   Recent Labs    05/11/17 1816  AST 80*  ALT 30  ALKPHOS 74  BILITOT 1.2  PROT 7.7  ALBUMIN 4.4   No results for input(s): LIPASE, AMYLASE in the last 72 hours. Recent Labs    05/11/17 1816  WBC 13.8*  NEUTROABS 10.8*  HGB 16.5*  HCT 47.6*  MCV 115.5*  PLT 322   Recent Labs    05/11/17 1816  CKTOTAL 290*  TROPONINI 0.12*   Invalid input(s): POCBNP No results for input(s): DDIMER in the last 72 hours. No results for input(s): HGBA1C in the last 72 hours. No results for input(s): CHOL, HDL, LDLCALC, TRIG, CHOLHDL, LDLDIRECT in the last 72 hours. No results for input(s): TSH, T4TOTAL, T3FREE, THYROIDAB in the last 72 hours.  Invalid input(s): FREET3 No results for input(s): VITAMINB12, FOLATE, FERRITIN, TIBC, IRON, RETICCTPCT in the last 72 hours.  Micro Results: No results found for this or any previous visit (from the past 240 hour(s)).   EKG: NSR  Radiological Exams on Admission: Ct Head Wo Contrast  Result Date: 05/11/2017 CLINICAL DATA:  Left body flaccidity. Found on the floor at home after not being seen for 2 days. EXAM: CT HEAD WITHOUT CONTRAST TECHNIQUE: Contiguous axial images were obtained from the base of the skull through the vertex without intravenous contrast. COMPARISON:  None. FINDINGS: Brain: Large area of white matter and gray matter low density in a right middle cerebral artery distribution, including the basal ganglia, with associated mass effect on the right lateral ventricle without midline shift. No intracranial hemorrhage. There is also diffuse pontine low density. Vascular: Hyperdense right middle cerebral artery. Skull: Normal.  Negative for fracture or focal lesion. Sinuses/Orbits: Unremarkable. Other: None. IMPRESSION: 1. Occluded right middle cerebral artery with a large subacute infarct in the right middle cerebral artery distribution. 2. Associated mass effect on the right lateral ventricle without midline shift. 3. Diffuse low density in the pons. This is most likely normal. Diffuse ischemic change is less likely. These results were called by telephone at the time of interpretation on 05/11/2017 at 7:11 pm to Dr. Sharman Cheek , who verbally acknowledged these results. Electronically Signed   By: Beckie Salts M.D.   On: 05/11/2017 19:13    Assessment/Plan Present on Admission: . CVA (cerebral vascular accident) (HCC) -Admit to [facility] -MRI/MRA head and neck ordered, lipid panel -Patient failed swallow study, n.p.o., IV fluid hydration -PT/OT consult, needs discharge to rehab but staff -2D echo, neurochecks -PRN Blood pressure medications systolic blood pressure greater than 190 -Patient out of the window for TPA  -EKG: NSR  Elevated troponin -Normal sinus rhythm EKG, cycle cardiac enzymes.  Doubt cardiac etiology.  Check total CK as patient may been on floor for extended period of time  Elevated WBC -Concern for aspiration pneumonia, CXR ordered and negative along with UA and blood cultures.  IV fluid hydration, will reassess white blood count in a.m. if worsening recommending IV clindamycin  HTN -Currently stable.  As needed Lopressor ordered  Depression -Pre-existing prior to current episode.  He went to need psych consult to treat depression along with her current CVA and left-sided paralysis  Tobacco abuse -15 patch, DuoNebs PRN  Lactose intolerant -  . possible alcohol abuse -Seizure protocol.  No scheduled benzos currently given the fact the patient has a recent CVA and getting neurochecks  Jawad Wiacek 05/11/2017, 7:32 PM

## 2017-05-11 NOTE — ED Triage Notes (Signed)
Patient lives at home by herself and fell sometime this week, she remembers waking up today and EMS was called because family could get in touch with her.  Pt has left facial droop, LA paralysis, LL paralysis.  She is alert and answering questions appropriately. Pt has no trauma noted but does have slight left hip bruise, and on her back is purple where she stated she used a eating pad and fell asleep in the last week. Pt is complaining of slight headache.

## 2017-05-11 NOTE — ED Notes (Signed)
Hospitalist at bedside 

## 2017-05-11 NOTE — ED Notes (Signed)
Date and time results received: 05/11/17 1854  Test: Troponin Critical Value: 0.12  Name of Provider Notified: Scotty CourtStafford  Orders Received? Or Actions Taken?: None at this time.

## 2017-05-12 ENCOUNTER — Inpatient Hospital Stay
Admit: 2017-05-12 | Discharge: 2017-05-12 | Disposition: A | Discharge status: Home / Self Care | Payer: 59 | Attending: Family Medicine | Admitting: Family Medicine

## 2017-05-12 DIAGNOSIS — I63511 Cerebral infarction due to unspecified occlusion or stenosis of right middle cerebral artery: Secondary | ICD-10-CM

## 2017-05-12 LAB — URINALYSIS, COMPLETE (UACMP) WITH MICROSCOPIC
BILIRUBIN URINE: NEGATIVE
Glucose, UA: NEGATIVE mg/dL
KETONES UR: 20 mg/dL — AB
Nitrite: NEGATIVE
PH: 6 (ref 5.0–8.0)
Protein, ur: 100 mg/dL — AB
Specific Gravity, Urine: 1.019 (ref 1.005–1.030)

## 2017-05-12 LAB — URINE DRUG SCREEN, QUALITATIVE (ARMC ONLY)
AMPHETAMINES, UR SCREEN: NOT DETECTED
Barbiturates, Ur Screen: NOT DETECTED
Benzodiazepine, Ur Scrn: NOT DETECTED
COCAINE METABOLITE, UR ~~LOC~~: NOT DETECTED
Cannabinoid 50 Ng, Ur ~~LOC~~: NOT DETECTED
MDMA (ECSTASY) UR SCREEN: NOT DETECTED
METHADONE SCREEN, URINE: NOT DETECTED
OPIATE, UR SCREEN: NOT DETECTED
Phencyclidine (PCP) Ur S: NOT DETECTED
Tricyclic, Ur Screen: NOT DETECTED

## 2017-05-12 LAB — ECHOCARDIOGRAM COMPLETE
HEIGHTINCHES: 62 in
Weight: 1449.6 oz

## 2017-05-12 LAB — HEMOGLOBIN A1C
Hgb A1c MFr Bld: 4.7 % — ABNORMAL LOW (ref 4.8–5.6)
MEAN PLASMA GLUCOSE: 88.19 mg/dL

## 2017-05-12 LAB — TROPONIN I
Troponin I: 0.12 ng/mL (ref <0.03)
Troponin I: 0.12 ng/mL (ref <0.03)

## 2017-05-12 LAB — LIPID PANEL
CHOL/HDL RATIO: 2.2 ratio
CHOLESTEROL: 123 mg/dL (ref 0–200)
HDL: 56 mg/dL (ref >40)
LDL Cholesterol: 53 mg/dL (ref 0–99)
TRIGLYCERIDES: 72 mg/dL (ref <150)
VLDL: 14 mg/dL (ref 0–40)

## 2017-05-12 MED ORDER — SODIUM CHLORIDE 0.9 % IV SOLN
25.0000 mg | Freq: Every evening | INTRAVENOUS | Status: DC | PRN
  Administered 2017-05-12 – 2017-05-15 (×4): 25 mg via INTRAVENOUS
  Filled 2017-05-12 (×5): qty 0.5

## 2017-05-12 MED ORDER — DEXTROSE 5 % IV SOLN
1.0000 g | INTRAVENOUS | Status: DC
  Administered 2017-05-12 – 2017-05-15 (×4): 1 g via INTRAVENOUS
  Filled 2017-05-12 (×4): qty 10

## 2017-05-12 NOTE — Evaluation (Signed)
Physical Therapy Evaluation Patient Details Name: Terri FoundLisa S Bender MRN: [id number] DOB: 06-20-1965 Today's Date: 05/12/2017   History of Present Illness  Pt admitted following CVA.  PMH includes Htn, frequent HA, depression and anxiety.  Clinical Impression  Pt is a 52 year old female who lives in a town home with her two children.  Pt was Bender on the floor of her home two days after experiencing a MCA stroke.  Pt presents with flaccidity and numbness of L UE/LE as well as L neglect.  Pt does not demonstrate difficulty with cognition and is able to respond to PT questions.  Pt presented with Detar NorthWFL strength of R side of body, sensation intact.  Pt was able to perform unilateral bridge with R LE to assist with bed mobility.  Pt unable to support upright posture while sitting on EOB with R UE at this time.  Required total assistance to sit at EOB.  Pt will continue to benefit from skilled PT with focus on adaptation to functional tasks with hemiplegia, strength and coordination and tolerance to activity.    Follow Up Recommendations SNF    Equipment Recommendations  (Appropriate AD will be determined as treatment progresses.)    Recommendations for Other Services OT consult;Speech consult     Precautions / Restrictions Precautions Precautions: Fall Restrictions Weight Bearing Restrictions: No      Mobility  Bed Mobility Overal bed mobility: Needs Assistance             General bed mobility comments: Pt max assist for all bed mobility.  Pt is able to bridge with R LE to assist with some scooting up in bed.  Transfers Overall transfer level: (Unable to perform due to hemiplegia.)                  Ambulation/Gait Ambulation/Gait assistance: (Unable to perform due to hemiplegia.)              Stairs            Wheelchair Mobility    Modified Rankin (Stroke Patients Only)       Balance Overall balance assessment: Needs assistance     Sitting balance -  Comments: Pt requires max A for sitting at EOB.  Unable to maintain head control. Postural control: Right lateral lean Standing balance support: Single extremity supported                                 Pertinent Vitals/Pain Pain Assessment: No/denies pain    Home Living Family/patient expects to be discharged to:: Private residence Living Arrangements: Children Available Help at Discharge: Family(Nobody available full time.) Type of Home: Apartment(town home) Home Access: Stairs to enter Entrance Stairs-Rails: Can reach both Entrance Stairs-Number of Steps: 20 stair in home total Home Layout: Two level Home Equipment: None      Prior Function Level of Independence: Independent         Comments: 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  Sensation not intact.  R WFL. LUE Sensation: decreased light touch;decreased proprioception LUE Coordination: decreased fine motor;decreased gross motor    Lower Extremity Assessment Lower Extremity Assessment: LLE deficits/detail LLE Deficits / Details: LLE completely flaccid.  Sensation not  intact.  R strength WFL. LLE Sensation: decreased light touch;decreased proprioception LLE Coordination: decreased gross motor    Cervical / Trunk Assessment Cervical / Trunk Assessment: (Unable to assess. Pt not able to control trunk or head to sit at EOB.)  Communication   Communication: No difficulties  Cognition Arousal/Alertness: Lethargic Behavior During Therapy: WFL for tasks assessed/performed Overall Cognitive Status: Within Functional Limits for tasks assessed                                        General Comments      Exercises     Assessment/Plan    PT Assessment Patient needs continued PT  services  PT Problem List Decreased strength;Decreased activity tolerance;Decreased balance;Decreased mobility;Decreased coordination;Impaired sensation       PT Treatment Interventions DME instruction;Gait training;Stair training;Functional mobility training;Therapeutic activities;Therapeutic exercise;Balance training;Neuromuscular re-education;Cognitive remediation;Patient/family education;Wheelchair mobility training;Manual techniques    PT Goals (Current goals can be Bender in the Care Plan section)  Acute Rehab PT Goals Patient Stated Goal: to continue with rehab. following DC PT Goal Formulation: With patient Time For Goal Achievement: 05/26/17 Potential to Achieve Goals: Good    Frequency 7X/week   Barriers to discharge        Co-evaluation               AM-PAC PT "6 Clicks" Daily Activity  Outcome Measure Difficulty turning over in bed (including adjusting bedclothes, sheets and blankets)?: Unable Difficulty moving from lying on back to sitting on the side of the bed? : Unable Difficulty sitting down on and standing up from a chair with arms (e.g., wheelchair, bedside commode, etc,.)?: Unable Help needed moving to and from a bed to chair (including a wheelchair)?: Total Help needed walking in hospital room?: Total Help needed climbing 3-5 steps with a railing? : Total 6 Click Score: 6    End of Session   Activity Tolerance: Patient tolerated treatment well Patient left: in bed;with call bell/phone within reach;with bed alarm set;with family/visitor present   PT Visit Diagnosis: Hemiplegia and hemiparesis Hemiplegia - Right/Left: Left Hemiplegia - caused by: Cerebral infarction    Time: 1191-4782 PT Time Calculation (min) (ACUTE ONLY): 20 min   Charges:   PT Evaluation $PT Eval Moderate Complexity: 1 Mod     PT G Codes:   PT G-Codes **NOT FOR INPATIENT CLASS** Functional Assessment Tool Used: AM-PAC 6 Clicks Basic Mobility Functional Limitation:  Changing and maintaining body position Changing and Maintaining Body Position Current Status (N5621): At least 80 percent but less than 100 percent impaired, limited or restricted Changing and Maintaining Body Position Goal Status (H0865): At least 20 percent but less than 40 percent impaired, limited or restricted    Glenetta Hew, PT, DPT   Glenetta Hew 05/12/2017, 5:32 PM

## 2017-05-12 NOTE — Progress Notes (Signed)
Swallow evaluation repeated per MD request/ pt failed/ MD aware/ will keep NPO until seen by speech therapy

## 2017-05-12 NOTE — Progress Notes (Signed)
*  PRELIMINARY RESULTS* Echocardiogram 2D Echocardiogram has been performed.  Terri Bender 05/12/2017, 9:34 AM

## 2017-05-12 NOTE — Progress Notes (Signed)
Pt can have [place] ICE ONLY with supervision by staff per MD until seen by speech / RN at bedside to monitor/ tolerated well/ no coughing noted.

## 2017-05-12 NOTE — Consult Note (Signed)
Reason for Consult:L side weakness  Referring Physician: Dr. Cherlynn Kaiser  CC: L side weakness   HPI: Terri Bender is an 52 y.o. female brought in by family after being found on floor as per family since Wednesday but pt states she was on ground for close to 1 week now. Found to have L MCA stroke with edema on CTH along with UTI   Past Medical History:  Diagnosis Date  . Anxiety   . Depression   . Frequent headaches   . History of chicken pox   . Hypertension     Past Surgical History:  Procedure Laterality Date  . CESAREAN SECTION    . FOOT SURGERY Left    bone spur    Family History  Problem Relation Age of Onset  . Heart disease Father   . Diabetes Paternal Grandmother   . Heart disease Paternal Grandfather     Social History:  reports that she has been smoking.  She has been smoking about 0.50 packs per day. she has never used smokeless tobacco. She reports that she drinks alcohol. She reports that she does not use drugs.  Allergies  Allergen Reactions  . Erythromycin Nausea And Vomiting  . Septra [Sulfamethoxazole-Trimethoprim] Hives    Medications: I have reviewed the patient's current medications.  ROS: History obtained from the patient  General ROS: negative for - chills, fatigue, fever, night sweats, weight gain or weight loss Psychological ROS: negative for - behavioral disorder, hallucinations, memory difficulties, mood swings or suicidal ideation Ophthalmic ROS: negative for - blurry vision, double vision, eye pain or loss of vision ENT ROS: negative for - epistaxis, nasal discharge, oral lesions, sore throat, tinnitus or vertigo Allergy and Immunology ROS: negative for - hives or itchy/watery eyes Hematological and Lymphatic ROS: negative for - bleeding problems, bruising or swollen lymph nodes Endocrine ROS: negative for - galactorrhea, hair pattern changes, polydipsia/polyuria or temperature intolerance Respiratory ROS: negative for - cough, hemoptysis,  shortness of breath or wheezing Cardiovascular ROS: negative for - chest pain, dyspnea on exertion, edema or irregular heartbeat Gastrointestinal ROS: negative for - abdominal pain, diarrhea, hematemesis, nausea/vomiting or stool incontinence Genito-Urinary ROS: negative for - dysuria, hematuria, incontinence or urinary frequency/urgency Musculoskeletal ROS: negative for - joint swelling or muscular weakness Neurological ROS: as noted in HPI Dermatological ROS: negative for rash and skin lesion changes  Physical Examination: Blood pressure 102/60, pulse 65, temperature 97.9 F (36.6 C), temperature source Oral, resp. rate 18, height 5\' 2"  (1.575 m), weight 90 lb 9.6 oz (41.1 kg), SpO2 100 %.   Neurological Examination   Mental Status: Alert, oriented. Dysarthric speech  Cranial Nerves: II: Discs flat bilaterally; Visual fields grossly normal, pupils equal, round, reactive to light and accommodation III,IV, VI: ptosis not present, extra-ocular motions intact bilaterally V,VII: L facial droop  VIII: hearing normal bilaterally IX,X: gag reflex present XI: bilateral shoulder shrug XII: midline tongue extension Motor: Right : Upper extremity   5/5    Left:     Upper extremity   0/5  Lower extremity   5/5     Lower extremity   0/5 Tone and bulk:normal tone throughout; no atrophy noted Sensory: decreased L side Deep Tendon Reflexes: 2+ and symmetric throughout Plantars: Right: downgoing   Left: downgoing Cerebellar: Not tested  Gait: not tested    Laboratory Studies:   Basic Metabolic Panel: Recent Labs  Lab 05/11/17 1816  NA 136  K 4.2  CL 97*  CO2 25  GLUCOSE 117*  BUN 17  CREATININE 0.63  CALCIUM 10.3    Liver Function Tests: Recent Labs  Lab 05/11/17 1816  AST 80*  ALT 30  ALKPHOS 74  BILITOT 1.2  PROT 7.7  ALBUMIN 4.4   No results for input(s): LIPASE, AMYLASE in the last 168 hours. No results for input(s): AMMONIA in the last 168 hours.  CBC: Recent  Labs  Lab 05/11/17 1816  WBC 13.8*  NEUTROABS 10.8*  HGB 16.5*  HCT 47.6*  MCV 115.5*  PLT 322    Cardiac Enzymes: Recent Labs  Lab 05/11/17 1816  CKTOTAL 290*  TROPONINI 0.12*    BNP: Invalid input(s): POCBNP  CBG: No results for input(s): GLUCAP in the last 168 hours.  Microbiology: Results for orders placed or performed during the hospital encounter of 05/11/17  Culture, blood (Routine X 2) w Reflex to ID Panel     Status: None (Preliminary result)   Collection Time: 05/11/17  8:14 PM  Result Value Ref Range Status   Specimen Description BLOOD RIGHT ANTECUBITAL  Final   Special Requests   Final    BOTTLES DRAWN AEROBIC AND ANAEROBIC Blood Culture adequate volume   Culture   Final    NO GROWTH < 12 HOURS Performed at [facility], [address] [place], [place] [address]    Report Status PENDING  Incomplete    Coagulation Studies: Recent Labs    05/11/17 1816  LABPROT 12.3  INR 0.92    Urinalysis:  Recent Labs  Lab 05/12/17 0439  COLORURINE YELLOW*  LABSPEC 1.019  PHURINE 6.0  GLUCOSEU NEGATIVE  HGBUR SMALL*  BILIRUBINUR NEGATIVE  KETONESUR 20*  PROTEINUR 100*  NITRITE NEGATIVE  LEUKOCYTESUR MODERATE*    Lipid Panel:     Component Value Date/Time   CHOL 123 05/12/2017 0411   TRIG 72 05/12/2017 0411   HDL 56 05/12/2017 0411   CHOLHDL 2.2 05/12/2017 0411   VLDL 14 05/12/2017 0411   LDLCALC 53 05/12/2017 0411    HgbA1C:  Lab Results  Component Value Date   HGBA1C 4.7 (L) 05/12/2017    Urine Drug Screen:      Component Value Date/Time   LABOPIA NONE DETECTED 05/12/2017 0439   COCAINSCRNUR NONE DETECTED 05/12/2017 0439   LABBENZ NONE DETECTED 05/12/2017 0439   AMPHETMU NONE DETECTED 05/12/2017 0439   THCU NONE DETECTED 05/12/2017 0439   LABBARB NONE DETECTED 05/12/2017 0439    Alcohol Level:  Recent Labs  Lab 05/11/17 1816  ETH <10    Other results: EKG: normal EKG, normal sinus rhythm, unchanged from previous  tracings.  Imaging: Ct Head Wo Contrast  Result Date: 05/11/2017 CLINICAL DATA:  Left body flaccidity. Found on the floor at home after not being seen for 2 days. EXAM: CT HEAD WITHOUT CONTRAST TECHNIQUE: Contiguous axial images were obtained from the base of the skull through the vertex without intravenous contrast. COMPARISON:  None. FINDINGS: Brain: Large area of white matter and gray matter low density in a right middle cerebral artery distribution, including the basal ganglia, with associated mass effect on the right lateral ventricle without midline shift. No intracranial hemorrhage. There is also diffuse pontine low density. Vascular: Hyperdense right middle cerebral artery. Skull: Normal. Negative for fracture or focal lesion. Sinuses/Orbits: Unremarkable. Other: None. IMPRESSION: 1. Occluded right middle cerebral artery with a large subacute infarct in the right middle cerebral artery distribution. 2. Associated mass effect on the right lateral ventricle without midline shift. 3. Diffuse low density in the pons.  This is most likely normal. Diffuse ischemic change is less likely. These results were called by telephone at the time of interpretation on 05/11/2017 at 7:11 pm to Dr. Sharman CheekPHILLIP STAFFORD , who verbally acknowledged these results. Electronically Signed   By: Beckie SaltsSteven  Reid M.D.   On: 05/11/2017 19:13   Dg Chest Port 1 View  Result Date: 05/11/2017 CLINICAL DATA:  Altered mental status EXAM: PORTABLE CHEST 1 VIEW COMPARISON:  05/02/2009 FINDINGS: The heart size and mediastinal contours are within normal limits. Both lungs are clear. The visualized skeletal structures are unremarkable. IMPRESSION: No active disease. Electronically Signed   By: Jasmine PangKim  Fujinaga M.D.   On: 05/11/2017 19:59     Assessment/Plan:  52 y.o. female brought in by family after being found on floor as per family since Wednesday but pt states she was on ground for close to 1 week now. Found to have L MCA stroke with edema  on CTH along with UTI   Plegic L side with some neglect Stroke at least 3 days or so old with edema.   Do not think will swell any more and as following commands don't think needs osmotic therapy at this time Pt/ot ASA rectal until passes bedside swallow evaluation  MRI and MRA that can be done tomorrow as it will not change management right now Hypercoagulable panel ordered UTI treatment as per primary team.    05/12/2017, 10:12 AM

## 2017-05-12 NOTE — Progress Notes (Signed)
[facility] - [place] at [facility]   PATIENT NAME: Terri DownerLisa Bender    MR#:  [id number]  DATE OF BIRTH:  Jan 01, 1966  SUBJECTIVE:   Patient here she was found down by her family. CT scan showing a large MCA stroke. Patient has left-sided hemiparesis with neglect. Denies any headache, chest pain, shortness of breath or any other symptoms presently. Patient did not pass her bedside swallow eval  REVIEW OF SYSTEMS:    Review of Systems  Constitutional: Negative for chills and fever.  HENT: Negative for congestion and tinnitus.   Eyes: Negative for blurred vision and double vision.  Respiratory: Negative for cough, shortness of breath and wheezing.   Cardiovascular: Negative for chest pain, orthopnea and PND.  Gastrointestinal: Negative for abdominal pain, diarrhea, nausea and vomiting.  Genitourinary: Negative for dysuria and hematuria.  Neurological: Positive for focal weakness (Dense Left sided Hemi-paresis). Negative for dizziness and sensory change.  All other systems reviewed and are negative.   Nutrition: NPO and await speech eval.  Tolerating Diet: No Tolerating PT: Await Eval.    DRUG ALLERGIES:   Allergies  Allergen Reactions  . Erythromycin Nausea And Vomiting  . Septra [Sulfamethoxazole-Trimethoprim] Hives    VITALS:  Blood pressure 102/60, pulse 65, temperature 97.9 F (36.6 C), temperature source Oral, resp. rate 18, height 5\' 2"  (1.575 m), weight 41.1 kg (90 lb 9.6 oz), SpO2 100 %.  PHYSICAL EXAMINATION:   Physical Exam  GENERAL:  52 y.o.-year-old patient lying in bed in no acute distress.  EYES: Pupils equal, round, reactive to light and accommodation. No scleral icterus. Extraocular muscles intact.  HEENT: Head atraumatic, normocephalic. Oropharynx and nasopharynx clear.  NECK:  Supple, no jugular venous distention. No thyroid enlargement, no tenderness.  LUNGS: Normal breath sounds bilaterally, no wheezing, rales, rhonchi. No use of  accessory muscles of respiration.  CARDIOVASCULAR: S1, S2 normal. No murmurs, rubs, or gallops.  ABDOMEN: Soft, nontender, nondistended. Bowel sounds present. No organomegaly or mass.  EXTREMITIES: No cyanosis, clubbing or edema b/l.    NEUROLOGIC: Cranial nerves II through XII are intact. Dense Left sided hemi-paresis with neglect.  PSYCHIATRIC: The patient is alert and oriented x 3.  SKIN: No obvious rash, lesion, or ulcer.    LABORATORY PANEL:   CBC Recent Labs  Lab 05/11/17 1816  WBC 13.8*  HGB 16.5*  HCT 47.6*  PLT 322   ------------------------------------------------------------------------------------------------------------------  Chemistries  Recent Labs  Lab 05/11/17 1816  NA 136  K 4.2  CL 97*  CO2 25  GLUCOSE 117*  BUN 17  CREATININE 0.63  CALCIUM 10.3  AST 80*  ALT 30  ALKPHOS 74  BILITOT 1.2   ------------------------------------------------------------------------------------------------------------------  Cardiac Enzymes Recent Labs  Lab 05/11/17 1816  TROPONINI 0.12*   ------------------------------------------------------------------------------------------------------------------  RADIOLOGY:  Ct Head Wo Contrast  Result Date: 05/11/2017 CLINICAL DATA:  Left body flaccidity. Found on the floor at home after not being seen for 2 days. EXAM: CT HEAD WITHOUT CONTRAST TECHNIQUE: Contiguous axial images were obtained from the base of the skull through the vertex without intravenous contrast. COMPARISON:  None. FINDINGS: Brain: Large area of white matter and gray matter low density in a right middle cerebral artery distribution, including the basal ganglia, with associated mass effect on the right lateral ventricle without midline shift. No intracranial hemorrhage. There is also diffuse pontine low density. Vascular: Hyperdense right middle cerebral artery. Skull: Normal. Negative for fracture or focal lesion. Sinuses/Orbits: Unremarkable. Other: None.  IMPRESSION: 1. Occluded right middle  cerebral artery with a large subacute infarct in the right middle cerebral artery distribution. 2. Associated mass effect on the right lateral ventricle without midline shift. 3. Diffuse low density in the pons. This is most likely normal. Diffuse ischemic change is less likely. These results were called by telephone at the time of interpretation on 05/11/2017 at 7:11 pm to Dr. Sharman Cheek , who verbally acknowledged these results. Electronically Signed   By: Beckie Salts M.D.   On: 05/11/2017 19:13   Dg Chest Port 1 View  Result Date: 05/11/2017 CLINICAL DATA:  Altered mental status EXAM: PORTABLE CHEST 1 VIEW COMPARISON:  05/02/2009 FINDINGS: The heart size and mediastinal contours are within normal limits. Both lungs are clear. The visualized skeletal structures are unremarkable. IMPRESSION: No active disease. Electronically Signed   By: Jasmine Pang M.D.   On: 05/11/2017 19:59     ASSESSMENT AND PLAN:   52 year old female with past medical history of hypertension, anxiety/depression, tobacco abuse who presented to the hospital she was found down by her family and noted to have an acute CVA.  1. Acute CVA-patient has a large right-sided MCA distribution stroke. She has significant dense left-sided hemiparesis with neglect. -Continue aspirin per rectum, appreciate neurology consult. Await MRI, MRA of the brain and neck. -Await PT, OT, speech evaluation. -Once patient can take by mouth we'll start the patient on a high intensity statin.  2. Urinary tract infection-based off the urinalysis on admission. -Continue IV ceftriaxone, follow urine cultures.  3. Essential hypertension-continue IV metoprolol as needed for now. Tolerate some element of hypertension given patient's acute CVA.  4. History of alcohol abuse-continue CIWA protocol, continue thiamine, folate.  5. Tobacco abuse-continue nicotine patch.    All the records are reviewed and case  discussed with Care Management/Social Worker. Management plans discussed with the patient, family and they are in agreement.  CODE STATUS: Full code  DVT Prophylaxis: Lovenox  TOTAL TIME TAKING CARE OF THIS PATIENT: 30 minutes.   POSSIBLE D/C IN 1-2 DAYS, DEPENDING ON CLINICAL CONDITION.   Houston Siren M.D on 05/12/2017 at 1:08 PM  Between 7am to 6pm - Pager - [phone]  After 6pm go to [email or web address] - Social research officer, government  [facility] [place] [facility]  Office  [phone]  CC: Primary care physician; Emi Belfast, FNP

## 2017-05-13 ENCOUNTER — Inpatient Hospital Stay: Payer: 59

## 2017-05-13 LAB — CBC
HEMATOCRIT: 32.1 % — AB (ref 35.0–47.0)
Hemoglobin: 11.1 g/dL — ABNORMAL LOW (ref 12.0–16.0)
MCH: 40.1 pg — ABNORMAL HIGH (ref 26.0–34.0)
MCHC: 34.5 g/dL (ref 32.0–36.0)
MCV: 116.2 fL — AB (ref 80.0–100.0)
PLATELETS: 200 10*3/uL (ref 150–440)
RBC: 2.76 MIL/uL — AB (ref 3.80–5.20)
RDW: 16.1 % — ABNORMAL HIGH (ref 11.5–14.5)
WBC: 6.5 10*3/uL (ref 3.6–11.0)

## 2017-05-13 LAB — BASIC METABOLIC PANEL
ANION GAP: 9 (ref 5–15)
BUN: 11 mg/dL (ref 6–20)
CO2: 20 mmol/L — ABNORMAL LOW (ref 22–32)
Calcium: 8.3 mg/dL — ABNORMAL LOW (ref 8.9–10.3)
Chloride: 109 mmol/L (ref 101–111)
Creatinine, Ser: 0.59 mg/dL (ref 0.44–1.00)
GLUCOSE: 129 mg/dL — AB (ref 65–99)
POTASSIUM: 2.5 mmol/L — AB (ref 3.5–5.1)
Sodium: 138 mmol/L (ref 135–145)

## 2017-05-13 LAB — HIV ANTIBODY (ROUTINE TESTING W REFLEX): HIV SCREEN 4TH GENERATION: NONREACTIVE

## 2017-05-13 LAB — MAGNESIUM: MAGNESIUM: 1.5 mg/dL — AB (ref 1.7–2.4)

## 2017-05-13 MED ORDER — ENSURE ENLIVE PO LIQD
237.0000 mL | Freq: Two times a day (BID) | ORAL | Status: DC
  Administered 2017-05-13 – 2017-05-16 (×6): 237 mL via ORAL

## 2017-05-13 MED ORDER — ESCITALOPRAM OXALATE 10 MG PO TABS
10.0000 mg | ORAL_TABLET | Freq: Every day | ORAL | Status: DC
  Administered 2017-05-14 – 2017-05-16 (×3): 10 mg via ORAL
  Filled 2017-05-13 (×4): qty 1

## 2017-05-13 MED ORDER — POTASSIUM CHLORIDE 20 MEQ PO PACK
20.0000 meq | PACK | Freq: Two times a day (BID) | ORAL | Status: DC
  Administered 2017-05-14 – 2017-05-16 (×4): 20 meq via ORAL
  Filled 2017-05-13 (×5): qty 1

## 2017-05-13 MED ORDER — MAGNESIUM SULFATE 4 GM/100ML IV SOLN
4.0000 g | Freq: Once | INTRAVENOUS | Status: AC
Start: 2017-05-13 — End: 2017-05-13
  Administered 2017-05-13: 4 g via INTRAVENOUS
  Filled 2017-05-13: qty 100

## 2017-05-13 MED ORDER — POTASSIUM CHLORIDE 20 MEQ PO PACK
40.0000 meq | PACK | Freq: Once | ORAL | Status: AC
  Administered 2017-05-13: 40 meq via ORAL
  Filled 2017-05-13: qty 2

## 2017-05-13 MED ORDER — POTASSIUM CHLORIDE 10 MEQ/100ML IV SOLN
10.0000 meq | INTRAVENOUS | Status: AC
  Administered 2017-05-13 (×3): 10 meq via INTRAVENOUS
  Filled 2017-05-13 (×4): qty 100

## 2017-05-13 MED ORDER — ATORVASTATIN CALCIUM 20 MG PO TABS
40.0000 mg | ORAL_TABLET | Freq: Every day | ORAL | Status: DC
  Administered 2017-05-13 – 2017-05-14 (×2): 40 mg via ORAL
  Filled 2017-05-13: qty 2
  Filled 2017-05-13: qty 4

## 2017-05-13 MED ORDER — GADOBENATE DIMEGLUMINE 529 MG/ML IV SOLN
10.0000 mL | Freq: Once | INTRAVENOUS | Status: AC | PRN
  Administered 2017-05-13: 8 mL via INTRAVENOUS

## 2017-05-13 NOTE — Progress Notes (Signed)
Initial Nutrition Assessment  DOCUMENTATION CODES:   Non-severe (moderate) malnutrition in context of chronic illness  INTERVENTION:   Recommend monitor K, Mg, and P as pt at high refeeding risk  Ensure Enlive po BID, each supplement provides 350 kcal and 20 grams of protein  Magic cup TID with meals, each supplement provides 290 kcal and 9 grams of protein  Dysphagia 2 diet  MVI daily  Thiamine 559RC po daily  Folic acid 20m po daily   Bowel regimen as needed per MD discretion   NUTRITION DIAGNOSIS:   Moderate Malnutrition related to social / environmental circumstances(etoh abuse ) as evidenced by 10 percent weight loss in 1 month, moderate fat depletion, moderate muscle depletion.  GOAL:   Patient will meet greater than or equal to 90% of their needs  MONITOR:   PO intake, Supplement acceptance, Weight trends, Labs, I & O's  REASON FOR ASSESSMENT:   Malnutrition Screening Tool    ASSESSMENT:   52year old female with past medical history of hypertension, anxiety/depression, tobacco abuse who presented to the hospital she was found down by her family and noted to have an acute CVA.   Met with pt in room today. Pt reports poor appetite and oral intake for the past month. Pt reports a 7lb unintentional weight loss over the past month. Per chart, pt has lost 10lbs(10%) over the past month; this is severe weight loss. Pt s/p SLP evaluation approved for dysphagia 2 diet. Pt eating 100% of meals. Pt reports that she has Ensure at home but has not yet started drinking it; RD recommended pt start drinking Ensure at home. Pt on CIWA. RD will add supplements. Pt at high refeeding risk; recommend monitor K, Mg, and P labs. Pt with low Mg and P today; this is being supplemented.    Medications reviewed and include: aspirin, lovenox, folic acid, MVI, KCl, thiamine, ceftriaxone, Mg sulfate  Labs reviewed: K 2.5(L), Ca 8.3(L), Mg 1.5(L)  Nutrition-Focused physical exam  completed. Findings are moderate fat depletions in arms and chest, moderate muscle depletions in clavicles, hands, and BLE, and no edema. Pt noted to have dry, dull hair.    Diet Order:  DIET DYS 2 Room service appropriate? Yes; Fluid consistency: Thin  EDUCATION NEEDS:   Education needs have been addressed  Skin: Reviewed RN Assessment  Last BM:  1/14- type 3  Height:   Ht Readings from Last 1 Encounters:  05/12/17 _0  (1.575 m)    Weight:   Wt Readings from Last 1 Encounters:  05/12/17 90 lb 9.6 oz (41.1 kg)    Ideal Body Weight:  50 kg  BMI:  Body mass index is 16.57 kg/m.  Estimated Nutritional Needs:   Kcal:  1200-1400kcal/day   Protein:  61-70g/day   Fluid:  >1.2L/day   CKoleen DistanceMS, RD, LDN Pager #-[phone] Hours Pager: [phone]

## 2017-05-13 NOTE — Progress Notes (Signed)
[facility] - [place] at [facility]   PATIENT NAME: Terri Bender    MR#:  [id number]  DATE OF BIRTH:  [birth year]  SUBJECTIVE:   Patient here she was found down by her family. CT scan showing a large MCA stroke. Patient has left-sided hemiparesis with neglect. Denies any headache, chest pain, shortness of breath or any other symptoms presently. Patient passed a swallow eval was started on a dysphagia 2 diet. Family is at bedside. Physical therapy evaluation noted. Awaiting MRI and MRA of the brain.  REVIEW OF SYSTEMS:    Review of Systems  Constitutional: Negative for chills and fever.  HENT: Negative for congestion and tinnitus.   Eyes: Negative for blurred vision and double vision.  Respiratory: Negative for cough, shortness of breath and wheezing.   Cardiovascular: Negative for chest pain, orthopnea and PND.  Gastrointestinal: Negative for abdominal pain, diarrhea, nausea and vomiting.  Genitourinary: Negative for dysuria and hematuria.  Neurological: Positive for focal weakness (Dense Left sided Hemi-paresis). Negative for dizziness and sensory change.  All other systems reviewed and are negative.   Nutrition: NPO and await speech eval.  Tolerating Diet: No Tolerating PT: Await Eval.    DRUG ALLERGIES:   Allergies  Allergen Reactions  . Erythromycin Nausea And Vomiting  . Septra [Sulfamethoxazole-Trimethoprim] Hives    VITALS:  Blood pressure 114/64, pulse 86, temperature 98.3 F (36.8 C), resp. rate 16, height 5\' 2"  (1.575 m), weight 41.1 kg (90 lb 9.6 oz), SpO2 100 %.  PHYSICAL EXAMINATION:   Physical Exam  GENERAL:  52 y.o.-year-old patient lying in bed in no acute distress.  EYES: Pupils equal, round, reactive to light and accommodation. No scleral icterus. Extraocular muscles intact.  HEENT: Head atraumatic, normocephalic. Oropharynx and nasopharynx clear.  NECK:  Supple, no jugular venous distention. No thyroid enlargement, no tenderness.   LUNGS: Normal breath sounds bilaterally, no wheezing, rales, rhonchi. No use of accessory muscles of respiration.  CARDIOVASCULAR: S1, S2 normal. No murmurs, rubs, or gallops.  ABDOMEN: Soft, nontender, nondistended. Bowel sounds present. No organomegaly or mass.  EXTREMITIES: No cyanosis, clubbing or edema b/l.    NEUROLOGIC: Cranial nerves II through XII are intact. Dense Left sided hemi-paresis with neglect.  PSYCHIATRIC: The patient is alert and oriented x 3.  SKIN: No obvious rash, lesion, or ulcer.    LABORATORY PANEL:   CBC Recent Labs  Lab 05/13/17 0410  WBC 6.5  HGB 11.1*  HCT 32.1*  PLT 200   ------------------------------------------------------------------------------------------------------------------  Chemistries  Recent Labs  Lab 05/11/17 1816 05/13/17 0410 05/13/17 0543  NA 136 138  --   K 4.2 2.5*  --   CL 97* 109  --   CO2 25 20*  --   GLUCOSE 117* 129*  --   BUN 17 11  --   CREATININE 0.63 0.59  --   CALCIUM 10.3 8.3*  --   MG  --   --  1.5*  AST 80*  --   --   ALT 30  --   --   ALKPHOS 74  --   --   BILITOT 1.2  --   --    ------------------------------------------------------------------------------------------------------------------  Cardiac Enzymes Recent Labs  Lab 05/12/17 1710  TROPONINI 0.12*   ------------------------------------------------------------------------------------------------------------------  RADIOLOGY:  Ct Head Wo Contrast  Result Date: 05/11/2017 CLINICAL DATA:  Left body flaccidity. Found on the floor at home after not being seen for 2 days. EXAM: CT HEAD WITHOUT CONTRAST TECHNIQUE: Contiguous axial images were  obtained from the base of the skull through the vertex without intravenous contrast. COMPARISON:  None. FINDINGS: Brain: Large area of white matter and gray matter low density in a right middle cerebral artery distribution, including the basal ganglia, with associated mass effect on the right lateral  ventricle without midline shift. No intracranial hemorrhage. There is also diffuse pontine low density. Vascular: Hyperdense right middle cerebral artery. Skull: Normal. Negative for fracture or focal lesion. Sinuses/Orbits: Unremarkable. Other: None. IMPRESSION: 1. Occluded right middle cerebral artery with a large subacute infarct in the right middle cerebral artery distribution. 2. Associated mass effect on the right lateral ventricle without midline shift. 3. Diffuse low density in the pons. This is most likely normal. Diffuse ischemic change is less likely. These results were called by telephone at the time of interpretation on 05/11/2017 at 7:11 pm to Dr. Sharman CheekPHILLIP STAFFORD , who verbally acknowledged these results. Electronically Signed   By: Beckie SaltsSteven  Reid M.D.   On: 05/11/2017 19:13   Dg Chest Port 1 View  Result Date: 05/11/2017 CLINICAL DATA:  Altered mental status EXAM: PORTABLE CHEST 1 VIEW COMPARISON:  05/02/2009 FINDINGS: The heart size and mediastinal contours are within normal limits. Both lungs are clear. The visualized skeletal structures are unremarkable. IMPRESSION: No active disease. Electronically Signed   By: Jasmine PangKim  Fujinaga M.D.   On: 05/11/2017 19:59     ASSESSMENT AND PLAN:   52 year old female with past medical history of hypertension, anxiety/depression, tobacco abuse who presented to the hospital as she was found down by her family and noted to have an acute CVA.  1. Acute CVA-patient has a large right-sided MCA distribution stroke. She has significant dense left-sided hemiparesis with neglect. -patient's pastor swallow eval and currently on a dysphagia 2 diet. Switched aspirin 2 orally. We'll start high-dose intensity statin. -Appreciate neurology consult. Await MRI/MRA of the brain and neck. Physical therapy evaluation noted patient likely discharge to short-term rehabilitation.  2. Urinary tract infection-based off the urinalysis on admission. -Continue IV  ceftriaxone.  3. Essential hypertension-continue IV metoprolol as needed for now. Tolerate some element of hypertension given patient's acute CVA.  4. Hypokalemia/Hypomagnesemia - will supplement orally and intravenously. Check Level in a.m.  - mg. Level was low and will give IV Mg.   5. History of alcohol abuse-continue CIWA protocol, continue thiamine, folate.  6. Tobacco abuse-continue nicotine patch.   All the records are reviewed and case discussed with Care Management/Social Worker. Management plans discussed with the patient, family and they are in agreement.  CODE STATUS: Full code  DVT Prophylaxis: Lovenox  TOTAL TIME TAKING CARE OF THIS PATIENT: 30 minutes.   POSSIBLE D/C IN 1-2 DAYS, DEPENDING ON CLINICAL CONDITION.   Houston SirenSAINANI,Stepen Prins J M.D on 05/13/2017 at 1:56 PM  Between 7am to 6pm - Pager - [phone]  After 6pm go to [email or web address] - Social research officer, governmentpassword EPAS [email or web address]  [facility] [place] [facility]  Office  [phone]  CC: Primary care physician; Emi BelfastGessner, Deborah B, FNP

## 2017-05-13 NOTE — Plan of Care (Signed)
  Activity: Risk for activity intolerance will decrease 05/13/2017 2246 - Progressing by Kalman JewelsBallentine, Sion Thane, RN   Clinical Measurements: Cardiovascular complication will be avoided 05/13/2017 2246 - Progressing by Kalman JewelsBallentine, Atoya Andrew, RN   Clinical Measurements: Diagnostic test results will improve 05/13/2017 2246 - Progressing by Kalman JewelsBallentine, Patricio Popwell, RN

## 2017-05-13 NOTE — Plan of Care (Signed)
  Progressing SLP Dysphagia Goals Patient will utilize recommended strategies Description Patient will utilize recommended strategies during swallow to increase swallowing safety with min cues  05/13/2017 1009 - Progressing by Wanda PlumpBueche, Almeta Geisel B, CCC-SLP 05/13/2017 1009 by Wanda PlumpBueche, Kele Withem B, CCC-SLP Flowsheets Taken 05/13/2017 1009  Patient will utilize recommended strategies during swallow to increase swallowing safety with  min assist Misc Dysphagia Goal Description Pt will tolerate least restrictive diet without overt s/s aspiration or decline in respiratory status.  05/13/2017 1009 - Progressing by Wanda PlumpBueche, Kodey Xue B, CCC-SLP Flowsheets Taken 05/13/2017 1009  Misc Dysphagia Goal  Pt will tolerate least restrictive diet without overt s/s aspiration or decline in respiratory status.

## 2017-05-13 NOTE — Progress Notes (Signed)
PT Cancellation Note  Patient Details Name: Terri Bender MRN: [id number] DOB: May 31, 1965   Cancelled Treatment:    Reason Eval/Treat Not Completed: Medical issues which prohibited therapy. Per chart review pt with low K (2.5) as well as elevated troponin value on 05/12/17 of 0.12 with no new current value this date. Re attempt per lab values tomorrow. Of note, pt also currently in testing.    Scot DockHeidi E Markus Casten, PTA 05/13/2017, 1:21 PM

## 2017-05-13 NOTE — Progress Notes (Signed)
Neurology:  MRI and MRA reviewed.    Pt has occlusion of the R MCA and decreased flow in the L vertebral. Basillar appears opoen.    There is a small L to R shift and petachial hemorrhage R BG  Because she is probably 5 days or so out from stroke and max edema will not treat with osmotic therapy.   CTH repeat in AM to look at the BG Rehab placement Con't ASA 325

## 2017-05-13 NOTE — Progress Notes (Signed)
Dr. Loretha BrasilZeylikman paged to make aware of MRI results

## 2017-05-13 NOTE — Consult Note (Signed)
No changes this AM Pt does not appear to be bothered by her weakness.    Past Medical History:  Diagnosis Date  . Anxiety   . Depression   . Frequent headaches   . History of chicken pox   . Hypertension     Past Surgical History:  Procedure Laterality Date  . CESAREAN SECTION    . FOOT SURGERY Left    bone spur    Family History  Problem Relation Age of Onset  . Heart disease Father   . Diabetes Paternal Grandmother   . Heart disease Paternal Grandfather     Social History:  reports that she has been smoking.  She has been smoking about 0.50 packs per day. she has never used smokeless tobacco. She reports that she drinks alcohol. She reports that she does not use drugs.  Allergies  Allergen Reactions  . Erythromycin Nausea And Vomiting  . Septra [Sulfamethoxazole-Trimethoprim] Hives    Medications: I have reviewed the patient's current medications.  in lesion changes  Physical Examination: Blood pressure 114/64, pulse 86, temperature 98.3 F (36.8 C), resp. rate 16, height 5\' 2"  (1.575 m), weight 90 lb 9.6 oz (41.1 kg), SpO2 100 %.   Neurological Examination   Mental Status: Alert, oriented. Dysarthric speech  Cranial Nerves: II: Discs flat bilaterally; Visual fields grossly normal, pupils equal, round, reactive to light and accommodation III,IV, VI: ptosis not present, extra-ocular motions intact bilaterally V,VII: L facial droop  VIII: hearing normal bilaterally IX,X: gag reflex present XI: bilateral shoulder shrug XII: midline tongue extension Motor: Right : Upper extremity   5/5    Left:     Upper extremity   0/5  Lower extremity   5/5     Lower extremity   0/5 Tone and bulk:normal tone throughout; no atrophy noted Sensory: decreased L side Deep Tendon Reflexes: 2+ and symmetric throughout Plantars: Right: downgoing   Left: downgoing Cerebellar: Not tested  Gait: not tested    Laboratory Studies:   Basic Metabolic Panel: Recent Labs  Lab  05/11/17 1816 05/13/17 0410 05/13/17 0543  NA 136 138  --   K 4.2 2.5*  --   CL 97* 109  --   CO2 25 20*  --   GLUCOSE 117* 129*  --   BUN 17 11  --   CREATININE 0.63 0.59  --   CALCIUM 10.3 8.3*  --   MG  --   --  1.5*    Liver Function Tests: Recent Labs  Lab 05/11/17 1816  AST 80*  ALT 30  ALKPHOS 74  BILITOT 1.2  PROT 7.7  ALBUMIN 4.4   No results for input(s): LIPASE, AMYLASE in the last 168 hours. No results for input(s): AMMONIA in the last 168 hours.  CBC: Recent Labs  Lab 05/11/17 1816 05/13/17 0410  WBC 13.8* 6.5  NEUTROABS 10.8*  --   HGB 16.5* 11.1*  HCT 47.6* 32.1*  MCV 115.5* 116.2*  PLT 322 200    Cardiac Enzymes: Recent Labs  Lab 05/11/17 1816 05/12/17 1234 05/12/17 1710  CKTOTAL 290*  --   --   TROPONINI 0.12* 0.12* 0.12*    BNP: Invalid input(s): POCBNP  CBG: No results for input(s): GLUCAP in the last 168 hours.  Microbiology: Results for orders placed or performed during the hospital encounter of 05/11/17  Culture, blood (Routine X 2) w Reflex to ID Panel     Status: None (Preliminary result)   Collection Time: 05/11/17  8:14  PM  Result Value Ref Range Status   Specimen Description BLOOD RIGHT ANTECUBITAL  Final   Special Requests   Final    BOTTLES DRAWN AEROBIC AND ANAEROBIC Blood Culture adequate volume   Culture   Final    NO GROWTH 2 DAYS Performed at [facility], [address] [place], [place] [address]    Report Status PENDING  Incomplete    Coagulation Studies: Recent Labs    05/11/17 1816  LABPROT 12.3  INR 0.92    Urinalysis:  Recent Labs  Lab 05/12/17 0439  COLORURINE YELLOW*  LABSPEC 1.019  PHURINE 6.0  GLUCOSEU NEGATIVE  HGBUR SMALL*  BILIRUBINUR NEGATIVE  KETONESUR 20*  PROTEINUR 100*  NITRITE NEGATIVE  LEUKOCYTESUR MODERATE*    Lipid Panel:     Component Value Date/Time   CHOL 123 05/12/2017 0411   TRIG 72 05/12/2017 0411   HDL 56 05/12/2017 0411   CHOLHDL 2.2  05/12/2017 0411   VLDL 14 05/12/2017 0411   LDLCALC 53 05/12/2017 0411    HgbA1C:  Lab Results  Component Value Date   HGBA1C 4.7 (L) 05/12/2017    Urine Drug Screen:      Component Value Date/Time   LABOPIA NONE DETECTED 05/12/2017 0439   COCAINSCRNUR NONE DETECTED 05/12/2017 0439   LABBENZ NONE DETECTED 05/12/2017 0439   AMPHETMU NONE DETECTED 05/12/2017 0439   THCU NONE DETECTED 05/12/2017 0439   LABBARB NONE DETECTED 05/12/2017 0439    Alcohol Level:  Recent Labs  Lab 05/11/17 1816  ETH <10    Other results: EKG: normal EKG, normal sinus rhythm, unchanged from previous tracings.  Imaging: Ct Head Wo Contrast  Result Date: 05/11/2017 CLINICAL DATA:  Left body flaccidity. Found on the floor at home after not being seen for 2 days. EXAM: CT HEAD WITHOUT CONTRAST TECHNIQUE: Contiguous axial images were obtained from the base of the skull through the vertex without intravenous contrast. COMPARISON:  None. FINDINGS: Brain: Large area of white matter and gray matter low density in a right middle cerebral artery distribution, including the basal ganglia, with associated mass effect on the right lateral ventricle without midline shift. No intracranial hemorrhage. There is also diffuse pontine low density. Vascular: Hyperdense right middle cerebral artery. Skull: Normal. Negative for fracture or focal lesion. Sinuses/Orbits: Unremarkable. Other: None. IMPRESSION: 1. Occluded right middle cerebral artery with a large subacute infarct in the right middle cerebral artery distribution. 2. Associated mass effect on the right lateral ventricle without midline shift. 3. Diffuse low density in the pons. This is most likely normal. Diffuse ischemic change is less likely. These results were called by telephone at the time of interpretation on 05/11/2017 at 7:11 pm to Dr. Sharman Cheek , who verbally acknowledged these results. Electronically Signed   By: Beckie Salts M.D.   On: 05/11/2017 19:13    Dg Chest Port 1 View  Result Date: 05/11/2017 CLINICAL DATA:  Altered mental status EXAM: PORTABLE CHEST 1 VIEW COMPARISON:  05/02/2009 FINDINGS: The heart size and mediastinal contours are within normal limits. Both lungs are clear. The visualized skeletal structures are unremarkable. IMPRESSION: No active disease. Electronically Signed   By: Jasmine Pang M.D.   On: 05/11/2017 19:59     Assessment/Plan:  52 y.o. female brought in by family after being found on floor as per family since Wednesday but pt states she was on ground for close to 1 week now. Found to have L MCA stroke with edema on [facility] along with UTI  Plegic L side with some neglect Stroke at least 3 days or so old with edema but no significant shift She does not appear to be bothered by the stroke symptoms so suspect there is psychiatric component present Hypercoagulable panel sent ASA started Awaiting MRI and MRA to decide proper antiplatelet therapy 05/13/2017, 12:12 PM

## 2017-05-13 NOTE — Progress Notes (Signed)
Called and spoke with patient. Plan for follow up pap smear in 1 year. Unfortunately patient was in the hospital for a stroke and said she had lost control of her left side.

## 2017-05-13 NOTE — Care Management Note (Signed)
Case Management Note  Patient Details  Name: Terri Bender MRN: [id number] Date of Birth: [birth year]  Subjective/Objective:                 Admitted with large cva.  PT is consulting.  Failed bedside swallowing and currently NPO. OT pending  Action/Plan:  Asked [facility] representative to follow patient just in case it is determined that patient would be an inpatient rehab candidate  Expected Discharge Date:                  Expected Discharge Plan:     In-House Referral:     Discharge planning Services     Post Acute Care Choice:    Choice offered to:     DME Arranged:    DME Agency:     HH Arranged:    HH Agency:     Status of Service:     If discussed at [employer] Meetings, dates discussed:    Additional Comments:  Eber HongGreene, Retina Bernardy R, RN 05/13/2017, 9:35 AM

## 2017-05-13 NOTE — Progress Notes (Signed)
OT Cancellation Note  Patient Details Name: Terri Bender MRN: [id number] DOB: 09-07-1965   Cancelled Treatment:    Reason Eval/Treat Not Completed: Medical issues which prohibited therapy. Order received, chart reviewed. Pt noted to have critical value potassium (2.5). Per therapy protocol, pt contraindicated for therapy at this time due to low potassium. Will continue to follow acutely and re-attempt OT evaluation at later date/time as medically appropriate.  Richrd PrimeJamie Stiller, MPH, MS, OTR/L ascom [phone] 05/13/17, 10:37 AM

## 2017-05-13 NOTE — Evaluation (Signed)
Clinical/Bedside Swallow Evaluation Patient Details  Name: Terri Bender MRN: [id number] Date of Birth: 11/13/1965  Today's Date: 05/13/2017 Time: SLP Start Time (ACUTE ONLY): 0930 SLP Stop Time (ACUTE ONLY): 0950 SLP Time Calculation (min) (ACUTE ONLY): 20 min  Past Medical History:  Past Medical History:  Diagnosis Date  . Anxiety   . Depression   . Frequent headaches   . History of chicken pox   . Hypertension    Past Surgical History:  Past Surgical History:  Procedure Laterality Date  . CESAREAN SECTION    . FOOT SURGERY Left    bone spur   HPI:  52 year old female admitted 05/11/17 after being found down at home, right side weakness. PMH significant for anxiety, depression, HTN, headaches. Head CT revealed occluded RMCA large subacute infarct with mass effect. CXR = negative for active disease   Assessment / Plan / Recommendation Clinical Impression  Pt presents with moderate left orofacial weakness, but minimal dysarthria. Trials of thin liquid, puree, and solid consistencies were given. Delayed cough noted after thin liquid via straw, however, small individual cup sips were tolerated without overt s/s aspiration. Trial of puree was tolerated without difficulty. Pt was noted to be impulsive with solid (graham cracker), with inattention of bolus on the left noted. No overt s/s aspiration noted on puree or solid items.   At this time, will begin dys 2 (finely chopped) diet with thin liquids - NO STRAWS, meds in puree, supervision during meals with minimized distractions. ST will continue to follow to assess diet tolerance and provide education, as well as to determine appropriateness for liberalized diet. Safe swallow precautions posted at [facility] and reviewed with pt/mother.    SLP Visit Diagnosis: Dysphagia, unspecified (R13.10)    Aspiration Risk  Mild aspiration risk    Diet Recommendation Dysphagia 2 (Fine chop);Thin liquid   Liquid Administration via: Cup;No  straw Medication Administration: Whole meds with puree Supervision: Patient able to self feed;Full supervision/cueing for compensatory strategies Compensations: Minimize environmental distractions;Slow rate;Small sips/bites;Lingual sweep for clearance of pocketing Postural Changes: Seated upright at 90 degrees    Other  Recommendations Oral Care Recommendations: Oral care QID Other Recommendations: Other (Comment)(NO STRAWS)   Follow up Recommendations (TBD)      Frequency and Duration min 1 x/week  1 week;2 weeks       Prognosis Prognosis for Safe Diet Advancement: Good      Swallow Study   General Date of Onset: 05/11/17 HPI: 52 year old female admitted 05/11/17 after being found down at home, right side weakness. PMH significant for anxiety, depression Type of Study: Bedside Swallow Evaluation Previous Swallow Assessment: none Diet Prior to this Study: NPO Temperature Spikes Noted: No Respiratory Status: Room air History of Recent Intubation: No Behavior/Cognition: Alert;Pleasant mood;Cooperative Oral Cavity Assessment: Within Functional Limits Oral Care Completed by SLP: Recent completion by staff Oral Cavity - Dentition: Adequate natural dentition Vision: Functional for self-feeding Self-Feeding Abilities: Able to feed self Patient Positioning: Upright in bed Baseline Vocal Quality: Normal Volitional Cough: Strong Volitional Swallow: Able to elicit    Oral/Motor/Sensory Function Overall Oral Motor/Sensory Function: Moderate impairment Facial ROM: Reduced left Facial Symmetry: Abnormal symmetry left Facial Strength: Reduced left Facial Sensation: Within Functional Limits Lingual ROM: Reduced left Lingual Symmetry: Abnormal symmetry left Lingual Strength: Reduced Lingual Sensation: Within Functional Limits Velum: Within Functional Limits Mandible: Within Functional Limits   Ice Chips Ice chips: Not tested   Thin Liquid Thin Liquid: Impaired Presentation:  Cup;Straw Pharyngeal  Phase Impairments: Cough - Delayed(cough with straw use)    Nectar Thick Nectar Thick Liquid: Not tested   Honey Thick Honey Thick Liquid: Not tested   Puree Puree: Within functional limits Presentation: Self Fed;Spoon   Solid   GO   Solid: Impaired Presentation: Self Fed Oral Phase Impairments: Poor awareness of bolus Oral Phase Functional Implications: Left anterior spillage       Terri Bender, Terri Alabama Medical CenterMSP, CCC-SLP Speech Language Pathologist [phone]  Terri Bender, Terri Bender 05/13/2017,10:04 AM

## 2017-05-14 ENCOUNTER — Inpatient Hospital Stay: Payer: 59

## 2017-05-14 LAB — LUPUS ANTICOAGULANT PANEL
DRVVT: 34.2 s (ref 0.0–47.0)
PTT Lupus Anticoagulant: 35.1 s (ref 0.0–51.9)

## 2017-05-14 LAB — PROTEIN S, TOTAL: PROTEIN S AG TOTAL: 78 % (ref 60–150)

## 2017-05-14 LAB — ANTITHROMBIN III ANTIGEN: AT III AG PPP IMM-ACNC: 84 % (ref 72–124)

## 2017-05-14 LAB — PROTEIN C ACTIVITY: PROTEIN C ACTIVITY: 82 % (ref 73–180)

## 2017-05-14 LAB — C DIFFICILE QUICK SCREEN W PCR REFLEX
C DIFFICLE (CDIFF) ANTIGEN: NEGATIVE
C Diff interpretation: NOT DETECTED
C Diff toxin: NEGATIVE

## 2017-05-14 LAB — CARDIOLIPIN ANTIBODIES, IGG, IGM, IGA
ANTICARDIOLIPIN IGM: 12 [MPL'U]/mL (ref 0–12)
Anticardiolipin IgA: 9 APL U/mL (ref 0–11)

## 2017-05-14 LAB — BETA-2-GLYCOPROTEIN I ABS, IGG/M/A: Beta-2-Glycoprotein I IgA: <9 GPI IgA units (ref 0–25)

## 2017-05-14 LAB — MAGNESIUM: MAGNESIUM: 2 mg/dL (ref 1.7–2.4)

## 2017-05-14 LAB — PHOSPHORUS: Phosphorus: 2.4 mg/dL — ABNORMAL LOW (ref 2.5–4.6)

## 2017-05-14 LAB — PROTEIN S ACTIVITY: Protein S Activity: 73 % (ref 63–140)

## 2017-05-14 LAB — HOMOCYSTEINE: HOMOCYSTEINE-NORM: 28 umol/L — AB (ref 0.0–15.0)

## 2017-05-14 LAB — POTASSIUM: POTASSIUM: 3.4 mmol/L — AB (ref 3.5–5.1)

## 2017-05-14 LAB — PROTEIN C, TOTAL: PROTEIN C, TOTAL: 68 % (ref 60–150)

## 2017-05-14 MED ORDER — ENSURE ENLIVE PO LIQD: 237.0000 mL | Freq: Two times a day (BID) | ORAL | 12 refills | Status: AC

## 2017-05-14 MED ORDER — LOPERAMIDE HCL 2 MG PO CAPS
2.0000 mg | ORAL_CAPSULE | ORAL | Status: DC | PRN
  Administered 2017-05-14: 2 mg via ORAL
  Filled 2017-05-14: qty 1

## 2017-05-14 MED ORDER — CEFUROXIME AXETIL 250 MG PO TABS: 250.0000 mg | ORAL_TABLET | Freq: Two times a day (BID) | ORAL | 0 refills | Status: AC

## 2017-05-14 MED ORDER — ASPIRIN 325 MG PO TBEC: 325.0000 mg | DELAYED_RELEASE_TABLET | Freq: Every day | ORAL | 0 refills | Status: AC

## 2017-05-14 MED ORDER — POTASSIUM CHLORIDE 20 MEQ PO PACK: 20.0000 meq | PACK | Freq: Two times a day (BID) | ORAL | 0 refills | Status: DC

## 2017-05-14 MED ORDER — ATORVASTATIN CALCIUM 40 MG PO TABS: 40.0000 mg | ORAL_TABLET | Freq: Every day | ORAL | Status: DC

## 2017-05-14 NOTE — Clinical Social Work Note (Signed)
Clinical Social Work Assessment  Patient Details  Name: Terri Bender MRN: [id number] Date of Birth: 1966/01/25  Date of referral:  05/14/17               Reason for consult:  Facility Placement, Discharge Planning                Permission sought to share information with:  Facility Medical sales representativeContact Representative, Family Supports Permission granted to share information::  Yes, Verbal Permission Granted  Name::     Satira SarkSchipmon, Dianne Mother   [phone]   Agency::  SNF admissions  Relationship::     Contact Information:     Housing/Transportation Living arrangements for the past 2 months:  Single Family Home Source of Information:  Patient Patient Interpreter Needed:  None Criminal Activity/Legal Involvement Pertinent to Current Situation/Hospitalization:  No - Comment as needed Significant Relationships:  Adult Children, Other Family Members Lives with:  Adult Children Do you feel safe going back to the place where you live?  No Need for family participation in patient care:  Yes (Comment)  Care giving concerns:  Patient feels she needs some short term rehab before she is able to return back home.   Social Worker assessment / plan:  Patient is a 52 year old female who is alert and oriented x4.  Patient lives with her two kids one is [id number], the other one is [id number], patient's mother is quite involved in the care that is being provided.  Patient states she has not been to rehab before, CSW explained to her what to expect and the process for looking for SNF placement.  Patient was explained how insurance will pay for stay at SNF, and process for getting authorization.  Patient request that CSW speak to her mother in regards to bed offer choices.  Patient's insurance does have a $3000 deductible that she needs to meet then she has a 20% coinsurance for SNF placement.  Patient states she does have access to the funds to help pay for her SNF stay.  Patient gave CSW permission to begin bed search in [place].   Employment status:  [employer]ical [employer] Insurance information:  Managed Care PT Recommendations:  Skilled Nursing Facility Information / Referral to community resources:  Skilled Nursing Facility  Patient/Family's Response to care:  Patient in agreement to going to SNF for short term rehab.  Patient/Family's Understanding of and Emotional Response to Diagnosis, Current Treatment, and Prognosis:  Patient is motivated to get better, and looking forward to going to a SNF for short term rehab.  Emotional Assessment Appearance:  Appears older than stated age Attitude/Demeanor/Rapport:    Affect (typically observed):  Appropriate, Calm Orientation:  Oriented to Place, Oriented to  Time, Oriented to Situation, Oriented to Self Alcohol / Substance use:  Alcohol Use Psych involvement (Current and /or in the community):  No (Comment)  Discharge Needs  Concerns to be addressed:  Lack of Support Readmission within the last 30 days:  No Current discharge risk:  Lack of support system Barriers to Discharge:  Continued Medical Work up, [employer] Authorization   Darleene Cleavernterhaus, Manasseh Pittsley R, LCSWA 05/14/2017, 5:03 PM

## 2017-05-14 NOTE — Plan of Care (Signed)
  Progressing Education: Knowledge of disease or condition will improve 05/14/2017 2209 - Progressing by Kalman JewelsBallentine, Adileny Delon, RN Knowledge of patient specific risk factors addressed and post discharge goals established will improve 05/14/2017 2209 - Progressing by Kalman JewelsBallentine, Ruel Dimmick, RN Education: Knowledge of General Education information will improve 05/14/2017 2209 - Progressing by Kalman JewelsBallentine, Cliff Damiani, RN Health Behavior/Discharge Planning: Ability to manage health-related needs will improve 05/14/2017 2209 - Progressing by Kalman JewelsBallentine, Jaxden Blyden, RN Clinical Measurements: Ability to maintain clinical measurements within normal limits will improve 05/14/2017 2209 - Progressing by Kalman JewelsBallentine, Hasaan Radde, RN Will remain free from infection 05/14/2017 2209 - Progressing by Kalman JewelsBallentine, Nelvin Tomb, RN Diagnostic test results will improve 05/14/2017 2209 - Progressing by Kalman JewelsBallentine, Shamieka Gullo, RN Respiratory complications will improve 05/14/2017 2209 - Progressing by Kalman JewelsBallentine, Avina Eberle, RN Cardiovascular complication will be avoided 05/14/2017 2209 - Progressing by Kalman JewelsBallentine, Tanush Drees, RN Activity: Risk for activity intolerance will decrease 05/14/2017 2209 - Progressing by Kalman JewelsBallentine, Endora Teresi, RN Nutrition: Adequate nutrition will be maintained 05/14/2017 2209 - Progressing by Kalman JewelsBallentine, Meribeth Vitug, RN Coping: Level of anxiety will decrease 05/14/2017 2209 - Progressing by Kalman JewelsBallentine, Justus Droke, RN Elimination: Will not experience complications related to bowel motility 05/14/2017 2209 - Progressing by Kalman JewelsBallentine, Hanni Milford, RN Will not experience complications related to urinary retention 05/14/2017 2209 - Progressing by Kalman JewelsBallentine, Kambree Krauss, RN

## 2017-05-14 NOTE — Clinical Social Work Note (Signed)
CSW spoke to patient and informed her about what her insurance benefits would be and the cost that she will have to pay up front once she goes to a SNF.  Patient asked CSW to inform her mother as well, CSW attempted to contact patients mother and provide bed offers, left a message on voice mail awaiting for a call back.  Per patient's insurance company she will have to pay $3000 up front which is her deductible, and depending on which SNF she chooses they can try to work with her on the cost.  Patient is aware of what her deductible is and states she can pay the deductible.  CSW to continue to follow patient's progress throughout discharge planning.  Ervin KnackEric R. Hassan Rowannterhaus, MSW, Theresia MajorsLCSWA [phone]  05/14/2017 4:48 PM

## 2017-05-14 NOTE — Evaluation (Signed)
Speech Language Pathology Evaluation Patient Details Name: Terri Bender MRN: [id number] DOB: [birth year] Today's Date: 05/14/2017 Time: 0802-0902 SLP Time Calculation (min) (ACUTE ONLY): 60 min  Problem List:  Patient Active Problem List   Diagnosis Date Noted  . CVA (cerebral vascular accident) (HCC) 05/11/2017  . HTN (hypertension) 05/11/2017  . Smoker 05/08/2017   Past Medical History:  Past Medical History:  Diagnosis Date  . Anxiety   . Depression   . Frequent headaches   . History of chicken pox   . Hypertension    Past Surgical History:  Past Surgical History:  Procedure Laterality Date  . CESAREAN SECTION    . FOOT SURGERY Left    bone spur   HPI:  Pt is a 52 year old female admitted 05/11/17 after being found down at home, right side weakness. PMH significant for anxiety, depression, HTN, headaches, Tobacco and alcohol use at home. MRI revealed Large R MCA distribution - Neurologist stated she is probably 5 days or so out from stroke; also noted small Petechial hemorrhage in basal ganglia w/ mass effect with right lateral ventrical.    Assessment / Plan / Recommendation Clinical Impression  Pt appears to present w/ Moderate Cognitive deficits moreso w/ tasks of basic-moderate complexity. This Cognitive presentation appears to impact her Linguistic abilities in areas of Recall and Elaboration of information w/out Perseverations, and providing correct information d/t Memory deficits. Pt was able to immediately Repeat information but exhibited deficits in short term Memory tasks of recall - accuracy was improved when given verbal, Semantic cues. Pt was able to maintain Attention during tasks but exhibited deficits during Number tasks of increased complexity. Pt exhibited most difficulty in Visuospatial and Executive tasks and the Clock drawing task - pt was Impulsive and did not follow Sequencing or Ordering of task information though encouraged to slow down and listen to  instructions. Pt does need her glasses for any visual/written tasks. Pt was Oriented x4; she was able to give biopersonal information re: self. Of note, upon attempting to use her cell phone, she appeared to become confused as to finding the correct screen to check her messages. She was able to verbalize choices for lunch meal w/ given options by SLP. Her Expressive and Receptive language skills appeared grossly WFL. Pt does present w/ mild-moderate Left Oral, labial-facial weakness but lingual strength/ROM appeared [facility] for bolus management today. Will attempt trials to upgrade food consistency in diet; NSG reported good toleration of thin liquids and Pills in Puree.  Pt will benefit from further skilled ST services to improve Cognitive-Linguistic skills for communication and independence of ADLs.     SLP Assessment  SLP Recommendation/Assessment: Patient needs continued Speech Lanaguage Pathology Services SLP Visit Diagnosis: Attention and concentration deficit;Frontal lobe and executive function deficit;Cognitive communication deficit (R41.841) Attention and concentration deficit following: Cerebral infarction Frontal lobe and executive function deficit following: Cerebral infarction    Follow Up Recommendations  Skilled Nursing facility(pt is d/t discharge today per MD/CM)    Frequency and Duration min 3x week  2 weeks      SLP Evaluation Cognition  Overall Cognitive Status: Impaired/Different from baseline Arousal/Alertness: Awake/alert Orientation Level: Oriented X4 Attention: Focused;Sustained;Alternating Focused Attention: Appears intact Sustained Attention: Impaired Sustained Attention Impairment: Verbal complex;Functional complex Alternating Attention: Impaired Alternating Attention Impairment: Verbal complex;Functional complex Memory: Impaired Awareness: Impaired Problem Solving: Impaired Problem Solving Impairment: Verbal complex;Functional complex Executive Function:  Reasoning;Sequencing;Organizing;Self Monitoring Reasoning: Impaired Reasoning Impairment: Verbal complex;Functional complex Sequencing: Impaired Sequencing Impairment: Verbal complex;Functional  complex Organizing: Impaired Organizing Impairment: Verbal complex;Functional complex Self Monitoring: Impaired Self Monitoring Impairment: Verbal complex;Functional complex Behaviors: Impulsive;Perseveration Safety/Judgment: (unable to fully determine today - need more assessment) Comments: would strongly recommend 24/7 supervision       Comprehension  Auditory Comprehension Overall Auditory Comprehension: Appears within functional limits for tasks assessed Yes/No Questions: Within Functional Limits Commands: Within Functional Limits Conversation: Simple Visual Recognition/Discrimination Discrimination: Not tested Reading Comprehension Reading Status: Within funtional limits(but impacted by not having her glassess(readers))    Expression Expression Primary Mode of Expression: Verbal Verbal Expression Overall Verbal Expression: Impaired Initiation: No impairment Automatic Speech: Name;Social Response;Counting;Day of week Level of Generative/Spontaneous Verbalization: Sentence Repetition: No impairment Naming: No impairment Pragmatics: No impairment Interfering Components: (perseveration) Non-Verbal Means of Communication: Not applicable Written Expression Dominant Hand: Right Written Expression: Not tested   Oral / Motor  Oral Motor/Sensory Function Overall Oral Motor/Sensory Function: Moderate impairment Facial ROM: Reduced left(mild) Facial Symmetry: Abnormal symmetry left(mild) Facial Strength: Reduced left(mild) Facial Sensation: Within Functional Limits Lingual ROM: Within Functional Limits(grossly) Lingual Symmetry: Abnormal symmetry left(slight) Lingual Strength: Within Functional Limits(grossly) Lingual Sensation: Within Functional Limits Velum: Within Functional  Limits Mandible: Within Functional Limits Motor Speech Overall Motor Speech: Appears within functional limits for tasks assessed Respiration: Within functional limits Phonation: Normal Resonance: Within functional limits Articulation: Within functional limitis Intelligibility: Intelligible Motor Planning: Not tested Motor Speech Errors: Not applicable   GO                    Terri SomKatherine Tiann Saha, MS, CCC-SLP Mirabelle Cyphers 05/14/2017, 3:30 PM

## 2017-05-14 NOTE — Progress Notes (Signed)
[facility] [place] [facility]   PATIENT NAME: Terri Bender    MR#:  [id number]  DATE OF BIRTH:  08-23-1965  SUBJECTIVE:   No acute events overnight. CT scan of the head this morning showing evolving right-sided MCA CVA. The basal ganglia hemorrhage is better appreciated and only mild midline shift. Clinically patient has unchanged since yesterday. She denies any headache nausea vomiting or any other focal weakness. He is to have dense left-sided hemiparesis.  REVIEW OF SYSTEMS:    Review of Systems  Constitutional: Negative for chills and fever.  HENT: Negative for congestion and tinnitus.   Eyes: Negative for blurred vision and double vision.  Respiratory: Negative for cough, shortness of breath and wheezing.   Cardiovascular: Negative for chest pain, orthopnea and PND.  Gastrointestinal: Negative for abdominal pain, diarrhea, nausea and vomiting.  Genitourinary: Negative for dysuria and hematuria.  Neurological: Positive for focal weakness (Dense Left sided Hemi-paresis). Negative for dizziness and sensory change.  All other systems reviewed and are negative.   Nutrition: NPO and await speech eval.  Tolerating Diet: No Tolerating PT: Await Eval.    DRUG ALLERGIES:   Allergies  Allergen Reactions  . Erythromycin Nausea And Vomiting  . Septra [Sulfamethoxazole-Trimethoprim] Hives    VITALS:  Blood pressure 101/64, pulse 78, temperature 98 F (36.7 C), temperature source Oral, resp. rate 18, height 5\' 2"  (1.575 m), weight 41.1 kg (90 lb 9.6 oz), SpO2 100 %.  PHYSICAL EXAMINATION:   Physical Exam  GENERAL:  52 y.o.-year-old patient lying in bed in no acute distress.  EYES: Pupils equal, round, reactive to light and accommodation. No scleral icterus. Extraocular muscles intact.  HEENT: Head atraumatic, normocephalic. Oropharynx and nasopharynx clear.  NECK:  Supple, no jugular venous distention. No thyroid enlargement, no tenderness.  LUNGS: Normal  breath sounds bilaterally, no wheezing, rales, rhonchi. No use of accessory muscles of respiration.  CARDIOVASCULAR: S1, S2 normal. No murmurs, rubs, or gallops.  ABDOMEN: Soft, nontender, nondistended. Bowel sounds present. No organomegaly or mass.  EXTREMITIES: No cyanosis, clubbing or edema b/l.    NEUROLOGIC: Cranial nerves II through XII are intact. Dense Left sided hemi-paresis with neglect.  PSYCHIATRIC: The patient is alert and oriented x 3.  SKIN: No obvious rash, lesion, or ulcer.    LABORATORY PANEL:   CBC Recent Labs  Lab 05/13/17 0410  WBC 6.5  HGB 11.1*  HCT 32.1*  PLT 200   ------------------------------------------------------------------------------------------------------------------  Chemistries  Recent Labs  Lab 05/11/17 1816 05/13/17 0410  05/14/17 0440  NA 136 138  --   --   K 4.2 2.5*  --  3.4*  CL 97* 109  --   --   CO2 25 20*  --   --   GLUCOSE 117* 129*  --   --   BUN 17 11  --   --   CREATININE 0.63 0.59  --   --   CALCIUM 10.3 8.3*  --   --   MG  --   --    < > 2.0  AST 80*  --   --   --   ALT 30  --   --   --   ALKPHOS 74  --   --   --   BILITOT 1.2  --   --   --    < > = values in this interval not displayed.   ------------------------------------------------------------------------------------------------------------------  Cardiac Enzymes Recent Labs  Lab 05/12/17 1710  TROPONINI 0.12*   ------------------------------------------------------------------------------------------------------------------  RADIOLOGY:  Ct Head Wo Contrast  Result Date: 05/14/2017 CLINICAL DATA:  Acute right MCA territory infarct EXAM: CT HEAD WITHOUT CONTRAST TECHNIQUE: Contiguous axial images were obtained from the base of the skull through the vertex without intravenous contrast. COMPARISON:  05/11/2017, 05/13/2017 FINDINGS: Brain: Diffuse hypoattenuation with sulcal effacement throughout the right cerebral hemisphere right MCA distribution involving  the frontal, parietal, and temporal lobes as well as the right basal ganglia. There is minimal leftward midline shift measuring 4.4 mm, image 11. Basilar cisterns remain patent. No cerebellar abnormality. No extra-axial fluid collection. Petechial hemorrhage within the basal ganglia better appreciated by MRI on 05/13/2017. Vascular: Previous hyperdense right middle cerebral artery is not as well visualized on today's exam. Skull: Normal. Negative for fracture or focal lesion. Sinuses/Orbits: No acute finding. Other: None. IMPRESSION: Evolving large subacute right MCA territory infarct with minimal leftward midline shift measuring 4 mm. Basal ganglia petechial hemorrhage better appreciated on yesterday's MRI. No large intracranial hemorrhage or extra-axial fluid collection by noncontrast CT. Electronically Signed   By: Judie Petit.  Shick M.D.   On: 05/14/2017 10:27   Mr Terri Bender Neck W Wo Contrast  Result Date: 05/13/2017 CLINICAL DATA:  52 y/o  F; stroke with weakness for follow-up. EXAM: MR HEAD WITHOUT CONTRAST MRA HEAD WITHOUT CONTRAST MRA OF THE NECK WITHOUT AND WITH CONTRAST TECHNIQUE: Multiplanar, multiecho pulse sequences of the brain and surrounding structures were obtained without intravenous contrast. Angiographic images of the neck were obtained using MRA technique without and with intravenous contrast. Angiographic images of the head were obtained using MRA technique without intravenous contrast. CONTRAST:  8mL MULTIHANCE GADOBENATE DIMEGLUMINE 529 MG/ML IV SOLN COMPARISON:  05/11/2017 CT head FINDINGS: MR HEAD FINDINGS Brain: Large region of reduced diffusion in the right MCA distribution compatible with subacute infarction as seen on CT involving posterolateral frontal lobe, lateral parietal lobe, superior posterior temporal lobe, insula, lentiform nucleus, caudate nucleus, and posterior limb of internal capsule. The infarct demonstrates T2 FLAIR hypertense signal abnormality with edema edema and mass effect  effacing the right lateral ventricle and resulting in 3 mm right-to-left midline shift. Susceptibility hypointensity throughout the right basal ganglia is compatible with petechial hemorrhage. No extra-axial hemorrhage or effacement of basilar cisterns. Vascular: As below. Skull and upper cervical spine: Normal marrow signal. Sinuses/Orbits: Mild left maxillary sinus mucosal thickening. Bilateral mastoid opacification. Orbits are unremarkable. Other: None. MRA HEAD FINDINGS Internal carotid arteries:  Patent. Anterior cerebral arteries:  Patent. Middle cerebral arteries: Patent left. Right distal M1 occlusion and absent flow related signal in M2 sylvian branches. Faint flow is seen within a right pre frontal cortical branch. Anterior communicating artery: Patent. Posterior communicating arteries: Patent. Increased flow related signal in right PCA distribution in comparison with the left without appreciable stenosis, probably due to compensatory collateral flow due to right MCA infarction. Posterior cerebral arteries:  Patent. Basilar artery:  Patent. Vertebral arteries:  Patent. No additional evidence of high-grade stenosis, large vessel occlusion, or aneurysm unless noted above. MRA NECK FINDINGS Aortic arch: Patent. Right common carotid artery: Patent. Right internal carotid artery: Patent. Right vertebral artery: Patent. Left common carotid artery: Patent. Left Internal carotid artery: Patent. Left Vertebral artery: Patent. There is no evidence of hemodynamically significant stenosis by NASCET criteria, occlusion, or aneurysm unless noted above. IMPRESSION: MRI head: Large right MCA distribution subacute infarction as seen on CT. Petechial hemorrhage in basal ganglia. Mass effect with right lateral ventricle partial effacement and 3 mm right-to-left midline shift. MRA head: 1. Right distal M1  occlusion with absent flow in M2 sylvian branches. Faint flow is seen within a right pre frontal cortical branch. 2.  Increased flow in right PCA relative to left without stenosis, probably compensatory collateral flow due to right MCA infarct. MRA neck: Normal MRA of the neck. These results will be called to the ordering clinician or representative by the Radiologist Assistant, and communication documented in the PACS or zVision Dashboard. Electronically Signed   By: Mitzi HansenLance  Furusawa-Stratton M.D.   On: 05/13/2017 13:52   Mr Brain Wo Contrast  Result Date: 05/13/2017 CLINICAL DATA:  52 y/o  F; stroke with weakness for follow-up. EXAM: MR HEAD WITHOUT CONTRAST MRA HEAD WITHOUT CONTRAST MRA OF THE NECK WITHOUT AND WITH CONTRAST TECHNIQUE: Multiplanar, multiecho pulse sequences of the brain and surrounding structures were obtained without intravenous contrast. Angiographic images of the neck were obtained using MRA technique without and with intravenous contrast. Angiographic images of the head were obtained using MRA technique without intravenous contrast. CONTRAST:  8mL MULTIHANCE GADOBENATE DIMEGLUMINE 529 MG/ML IV SOLN COMPARISON:  05/11/2017 CT head FINDINGS: MR HEAD FINDINGS Brain: Large region of reduced diffusion in the right MCA distribution compatible with subacute infarction as seen on CT involving posterolateral frontal lobe, lateral parietal lobe, superior posterior temporal lobe, insula, lentiform nucleus, caudate nucleus, and posterior limb of internal capsule. The infarct demonstrates T2 FLAIR hypertense signal abnormality with edema edema and mass effect effacing the right lateral ventricle and resulting in 3 mm right-to-left midline shift. Susceptibility hypointensity throughout the right basal ganglia is compatible with petechial hemorrhage. No extra-axial hemorrhage or effacement of basilar cisterns. Vascular: As below. Skull and upper cervical spine: Normal marrow signal. Sinuses/Orbits: Mild left maxillary sinus mucosal thickening. Bilateral mastoid opacification. Orbits are unremarkable. Other: None. MRA  HEAD FINDINGS Internal carotid arteries:  Patent. Anterior cerebral arteries:  Patent. Middle cerebral arteries: Patent left. Right distal M1 occlusion and absent flow related signal in M2 sylvian branches. Faint flow is seen within a right pre frontal cortical branch. Anterior communicating artery: Patent. Posterior communicating arteries: Patent. Increased flow related signal in right PCA distribution in comparison with the left without appreciable stenosis, probably due to compensatory collateral flow due to right MCA infarction. Posterior cerebral arteries:  Patent. Basilar artery:  Patent. Vertebral arteries:  Patent. No additional evidence of high-grade stenosis, large vessel occlusion, or aneurysm unless noted above. MRA NECK FINDINGS Aortic arch: Patent. Right common carotid artery: Patent. Right internal carotid artery: Patent. Right vertebral artery: Patent. Left common carotid artery: Patent. Left Internal carotid artery: Patent. Left Vertebral artery: Patent. There is no evidence of hemodynamically significant stenosis by NASCET criteria, occlusion, or aneurysm unless noted above. IMPRESSION: MRI head: Large right MCA distribution subacute infarction as seen on CT. Petechial hemorrhage in basal ganglia. Mass effect with right lateral ventricle partial effacement and 3 mm right-to-left midline shift. MRA head: 1. Right distal M1 occlusion with absent flow in M2 sylvian branches. Faint flow is seen within a right pre frontal cortical branch. 2. Increased flow in right PCA relative to left without stenosis, probably compensatory collateral flow due to right MCA infarct. MRA neck: Normal MRA of the neck. These results will be called to the ordering clinician or representative by the Radiologist Assistant, and communication documented in the PACS or zVision Dashboard. Electronically Signed   By: Mitzi HansenLance  Furusawa-Stratton M.D.   On: 05/13/2017 13:52   Mr Terri GlennMra Head/brain ZOWo Cm  Result Date:  05/13/2017 CLINICAL DATA:  52 y/o  F; stroke with  weakness for follow-up. EXAM: MR HEAD WITHOUT CONTRAST MRA HEAD WITHOUT CONTRAST MRA OF THE NECK WITHOUT AND WITH CONTRAST TECHNIQUE: Multiplanar, multiecho pulse sequences of the brain and surrounding structures were obtained without intravenous contrast. Angiographic images of the neck were obtained using MRA technique without and with intravenous contrast. Angiographic images of the head were obtained using MRA technique without intravenous contrast. CONTRAST:  8mL MULTIHANCE GADOBENATE DIMEGLUMINE 529 MG/ML IV SOLN COMPARISON:  05/11/2017 CT head FINDINGS: MR HEAD FINDINGS Brain: Large region of reduced diffusion in the right MCA distribution compatible with subacute infarction as seen on CT involving posterolateral frontal lobe, lateral parietal lobe, superior posterior temporal lobe, insula, lentiform nucleus, caudate nucleus, and posterior limb of internal capsule. The infarct demonstrates T2 FLAIR hypertense signal abnormality with edema edema and mass effect effacing the right lateral ventricle and resulting in 3 mm right-to-left midline shift. Susceptibility hypointensity throughout the right basal ganglia is compatible with petechial hemorrhage. No extra-axial hemorrhage or effacement of basilar cisterns. Vascular: As below. Skull and upper cervical spine: Normal marrow signal. Sinuses/Orbits: Mild left maxillary sinus mucosal thickening. Bilateral mastoid opacification. Orbits are unremarkable. Other: None. MRA HEAD FINDINGS Internal carotid arteries:  Patent. Anterior cerebral arteries:  Patent. Middle cerebral arteries: Patent left. Right distal M1 occlusion and absent flow related signal in M2 sylvian branches. Faint flow is seen within a right pre frontal cortical branch. Anterior communicating artery: Patent. Posterior communicating arteries: Patent. Increased flow related signal in right PCA distribution in comparison with the left without  appreciable stenosis, probably due to compensatory collateral flow due to right MCA infarction. Posterior cerebral arteries:  Patent. Basilar artery:  Patent. Vertebral arteries:  Patent. No additional evidence of high-grade stenosis, large vessel occlusion, or aneurysm unless noted above. MRA NECK FINDINGS Aortic arch: Patent. Right common carotid artery: Patent. Right internal carotid artery: Patent. Right vertebral artery: Patent. Left common carotid artery: Patent. Left Internal carotid artery: Patent. Left Vertebral artery: Patent. There is no evidence of hemodynamically significant stenosis by NASCET criteria, occlusion, or aneurysm unless noted above. IMPRESSION: MRI head: Large right MCA distribution subacute infarction as seen on CT. Petechial hemorrhage in basal ganglia. Mass effect with right lateral ventricle partial effacement and 3 mm right-to-left midline shift. MRA head: 1. Right distal M1 occlusion with absent flow in M2 sylvian branches. Faint flow is seen within a right pre frontal cortical branch. 2. Increased flow in right PCA relative to left without stenosis, probably compensatory collateral flow due to right MCA infarct. MRA neck: Normal MRA of the neck. These results will be called to the ordering clinician or representative by the Radiologist Assistant, and communication documented in the PACS or zVision Dashboard. Electronically Signed   By: Mitzi Hansen M.D.   On: 05/13/2017 13:52     ASSESSMENT AND PLAN:   52 year old female with past medical history of hypertension, anxiety/depression, tobacco abuse who presented to the hospital as she was found down by her family and noted to have an acute CVA.  1. Acute CVA-patient has a large right-sided MCA distribution stroke. She has significant dense left-sided hemiparesis with neglect. -patient's passed swallow eval and currently on a dysphagia 3 diet. Cont. ASA, Statin.  -MRI/MRA of the brain showing large right-sided MCA  CVA. Patient has some mild midline shift and petechial hemorrhage in the basal ganglia. CT scan of the head repeated today showing no worsening of the midline shift. -Appreciate neurology consult.  Physical therapy evaluation noted patient likely discharge to short-term rehabilitation.  2. Urinary tract infection-based off the urinalysis on admission. No cultures were sent.  -Continue IV ceftriaxone X 3 days.   3. Essential hypertension-continue IV metoprolol as needed for now. Tolerate some element of hypertension given patient's acute CVA.  4. Hypokalemia/Hypomagnesemia - improved w/ supplementation and will cont. To monitor.   5. History of alcohol abuse-continue CIWA protocol, continue thiamine, folate.  6. Tobacco abuse-continue nicotine patch.   All the records are reviewed and case discussed with Care Management/Social Worker. Management plans discussed with the patient, family and they are in agreement.  CODE STATUS: Full code  DVT Prophylaxis: Lovenox  TOTAL TIME TAKING CARE OF THIS PATIENT: 30 minutes.   POSSIBLE D/C IN 1-2 DAYS, DEPENDING ON CLINICAL CONDITION.   Houston Siren M.D on 05/14/2017 at 2:25 PM  Between 7am to 6pm - Pager - [phone]  After 6pm go to [email or web address] - Social research officer, government  [facility]  Office  [phone]  CC: Primary care physician; Emi Belfast, FNP

## 2017-05-14 NOTE — [place] FL2 (Signed)
[place] MEDICAID FL2 LEVEL OF CARE SCREENING TOOL     IDENTIFICATION  Patient Name: Terri Bender Birthdate: Feb 19, 1966 Sex: female Admission Date (Current Location): 05/11/2017  [place]   Facility and Address:  [facility], [address], [place], [place] [address]      Provider Number: [id number]  Attending Physician Name and Address:  Houston SirenSainani, Vivek J, MD  Relative Name and Phone Number:  Satira SarkSchipmon, Dianne Mother   [phone] or Anastasi,scott Friend   [phone]     Current Level of Care: Hospital Recommended Level of Care: Skilled Nursing Facility Prior Approval Number:    Date Approved/Denied:   PASRR Number: [id number]  Discharge Plan: SNF    Current Diagnoses: Patient Active Problem List   Diagnosis Date Noted  . CVA (cerebral vascular accident) (HCC) 05/11/2017  . HTN (hypertension) 05/11/2017  . Smoker 05/08/2017    Orientation RESPIRATION BLADDER Height & Weight     Self, Time, Situation, Place  Normal Incontinent Weight: 90 lb 9.6 oz (41.1 kg) Height:  5\' 2"  (157.5 cm)  BEHAVIORAL SYMPTOMS/MOOD NEUROLOGICAL BOWEL NUTRITION STATUS      Continent Diet(Dysphagia 2 diet)  AMBULATORY STATUS COMMUNICATION OF NEEDS Skin   Limited Assist Verbally Normal                       Personal Care Assistance Level of Assistance  Bathing, Feeding, Dressing Bathing Assistance: Limited assistance Feeding assistance: Limited assistance Dressing Assistance: Limited assistance     Functional Limitations Info  Sight, Hearing, Speech Sight Info: Adequate Hearing Info: Adequate Speech Info: Adequate    SPECIAL CARE FACTORS FREQUENCY  PT (By licensed PT)  OT (By licensed OT)     PT Frequency: 5x a week  OT Frequency: 5x a week              Contractures Contractures Info: Not present    Additional Factors Info  Code Status, Allergies, Psychotropic Code Status Info: Full Code Allergies  Info: ERYTHROMYCIN, SEPTRA SULFAMETHOXAZOLE-TRIMETHOPRIM  Psychotropic Info: escitalopram (LEXAPRO) tablet 10 mg          Current Medications (05/14/2017):  This is the current hospital active medication list Current Facility-Administered Medications  Medication Dose Route Frequency Provider Last Rate Last Dose  . 0.9 %  sodium chloride infusion   Intravenous Once Sharman CheekStafford, Phillip, MD      . 0.9 %  sodium chloride infusion   Intravenous Continuous Joneen Roachrosley, Debby, MD 100 mL/hr at 05/12/17 1030    . acetaminophen (TYLENOL) tablet 650 mg  650 mg Oral Q4H PRN Gery Prayrosley, Debby, MD   650 mg at 05/13/17 2343   Or  . acetaminophen (TYLENOL) solution 650 mg  650 mg Per Tube Q4H PRN Crosley, Debby, MD       Or  . acetaminophen (TYLENOL) suppository 650 mg  650 mg Rectal Q4H PRN Gery Prayrosley, Debby, MD   650 mg at 05/12/17 2033  . aspirin EC tablet 325 mg  325 mg Oral Daily Gery Prayrosley, Debby, MD   325 mg at 05/14/17 0850  . atorvastatin (LIPITOR) tablet 40 mg  40 mg Oral q1800 Houston SirenSainani, Vivek J, MD   40 mg at 05/13/17 1701  . cefTRIAXone (ROCEPHIN) 1 g in dextrose 5 % 50 mL IVPB  1 g Intravenous Q24H Arnaldo Nataliamond, Michael S, MD   Stopped at 05/14/17 0550  . diphenhydrAMINE (BENADRYL) 25 mg in sodium chloride 0.9 % 50 mL IVPB  25 mg Intravenous  QHS PRN Oralia Manis, MD 100 mL/hr at 05/13/17 2124 25 mg at 05/13/17 2124  . enoxaparin (LOVENOX) injection 30 mg  30 mg Subcutaneous Q24H Crosley, Debby, MD   30 mg at 05/13/17 2123  . escitalopram (LEXAPRO) tablet 10 mg  10 mg Oral Daily Houston Siren, MD   10 mg at 05/14/17 0850  . feeding supplement (ENSURE ENLIVE) (ENSURE ENLIVE) liquid 237 mL  237 mL Oral BID BM Sainani, Rolly Pancake, MD   237 mL at 05/14/17 0900  . folic acid (FOLVITE) tablet 1 mg  1 mg Oral Daily Crosley, Debby, MD   1 mg at 05/14/17 0849  . ipratropium-albuterol (DUONEB) 0.5-2.5 (3) MG/3ML nebulizer solution 3 mL  3 mL Nebulization Q4H PRN Crosley, Debby, MD      . LORazepam (ATIVAN) tablet 1 mg  1 mg  Oral Q6H PRN Gery Pray, MD       Or  . LORazepam (ATIVAN) injection 1 mg  1 mg Intravenous Q6H PRN Crosley, Debby, MD      . metoprolol tartrate (LOPRESSOR) injection 5 mg  5 mg Intravenous Q4H PRN Crosley, Debby, MD      . multivitamin with minerals tablet 1 tablet  1 tablet Oral Daily Gery Pray, MD   1 tablet at 05/14/17 0849  . potassium chloride (KLOR-CON) packet 20 mEq  20 mEq Oral BID Houston Siren, MD   20 mEq at 05/14/17 0851  . thiamine (VITAMIN B-1) tablet 100 mg  100 mg Oral Daily Crosley, Debby, MD   100 mg at 05/14/17 0850   Or  . thiamine (B-1) injection 100 mg  100 mg Intravenous Daily Gery Pray, MD         Discharge Medications: Please see discharge summary for a list of discharge medications.  Relevant Imaging Results:  Relevant Lab Results:   Additional Information SSN [id number]  Darleene Cleaver, [place]

## 2017-05-14 NOTE — Consult Note (Signed)
No changes this AM Pt does not appear to be bothered by her weakness.    Past Medical History:  Diagnosis Date  . Anxiety   . Depression   . Frequent headaches   . History of chicken pox   . Hypertension     Past Surgical History:  Procedure Laterality Date  . CESAREAN SECTION    . FOOT SURGERY Left    bone spur    Family History  Problem Relation Age of Onset  . Heart disease Father   . Diabetes Paternal Grandmother   . Heart disease Paternal Grandfather     Social History:  reports that she has been smoking.  She has been smoking about 0.50 packs per day. she has never used smokeless tobacco. She reports that she drinks alcohol. She reports that she does not use drugs.  Allergies  Allergen Reactions  . Erythromycin Nausea And Vomiting  . Septra [Sulfamethoxazole-Trimethoprim] Hives    Medications: I have reviewed the patient's current medications.  in lesion changes  Physical Examination: Blood pressure 101/64, pulse 78, temperature 98 F (36.7 C), temperature source Oral, resp. rate 18, height 5\' 2"  (1.575 m), weight 90 lb 9.6 oz (41.1 kg), SpO2 100 %.   Neurological Examination   Mental Status: Alert, oriented. Dysarthric speech  Cranial Nerves: II: Discs flat bilaterally; Visual fields grossly normal, pupils equal, round, reactive to light and accommodation III,IV, VI: ptosis not present, extra-ocular motions intact bilaterally V,VII: L facial droop  VIII: hearing normal bilaterally IX,X: gag reflex present XI: bilateral shoulder shrug XII: midline tongue extension Motor: Right : Upper extremity   5/5    Left:     Upper extremity   0/5  Lower extremity   5/5     Lower extremity   0/5 Tone and bulk:normal tone throughout; no atrophy noted Sensory: decreased L side Deep Tendon Reflexes: 2+ and symmetric throughout Plantars: Right: downgoing   Left: downgoing Cerebellar: Not tested  Gait: not tested    Laboratory Studies:   Basic Metabolic  Panel: Recent Labs  Lab 05/11/17 1816 05/13/17 0410 05/13/17 0543 05/14/17 0440  NA 136 138  --   --   K 4.2 2.5*  --  3.4*  CL 97* 109  --   --   CO2 25 20*  --   --   GLUCOSE 117* 129*  --   --   BUN 17 11  --   --   CREATININE 0.63 0.59  --   --   CALCIUM 10.3 8.3*  --   --   MG  --   --  1.5* 2.0  PHOS  --   --   --  2.4*    Liver Function Tests: Recent Labs  Lab 05/11/17 1816  AST 80*  ALT 30  ALKPHOS 74  BILITOT 1.2  PROT 7.7  ALBUMIN 4.4   No results for input(s): LIPASE, AMYLASE in the last 168 hours. No results for input(s): AMMONIA in the last 168 hours.  CBC: Recent Labs  Lab 05/11/17 1816 05/13/17 0410  WBC 13.8* 6.5  NEUTROABS 10.8*  --   HGB 16.5* 11.1*  HCT 47.6* 32.1*  MCV 115.5* 116.2*  PLT 322 200    Cardiac Enzymes: Recent Labs  Lab 05/11/17 1816 05/12/17 1234 05/12/17 1710  CKTOTAL 290*  --   --   TROPONINI 0.12* 0.12* 0.12*    BNP: Invalid input(s): POCBNP  CBG: No results for input(s): GLUCAP in the last 168 hours.  Microbiology: Results for orders placed or performed during the hospital encounter of 05/11/17  Culture, blood (Routine X 2) w Reflex to ID Panel     Status: None (Preliminary result)   Collection Time: 05/11/17  8:14 PM  Result Value Ref Range Status   Specimen Description BLOOD RIGHT ANTECUBITAL  Final   Special Requests   Final    BOTTLES DRAWN AEROBIC AND ANAEROBIC Blood Culture adequate volume   Culture   Final    NO GROWTH 3 DAYS Performed at [facility], [address] [place], [place] [address]    Report Status PENDING  Incomplete    Coagulation Studies: Recent Labs    05/11/17 1816  LABPROT 12.3  INR 0.92    Urinalysis:  Recent Labs  Lab 05/12/17 0439  COLORURINE YELLOW*  LABSPEC 1.019  PHURINE 6.0  GLUCOSEU NEGATIVE  HGBUR SMALL*  BILIRUBINUR NEGATIVE  KETONESUR 20*  PROTEINUR 100*  NITRITE NEGATIVE  LEUKOCYTESUR MODERATE*    Lipid Panel:     Component Value  Date/Time   CHOL 123 05/12/2017 0411   TRIG 72 05/12/2017 0411   HDL 56 05/12/2017 0411   CHOLHDL 2.2 05/12/2017 0411   VLDL 14 05/12/2017 0411   LDLCALC 53 05/12/2017 0411    HgbA1C:  Lab Results  Component Value Date   HGBA1C 4.7 (L) 05/12/2017    Urine Drug Screen:      Component Value Date/Time   LABOPIA NONE DETECTED 05/12/2017 0439   COCAINSCRNUR NONE DETECTED 05/12/2017 0439   LABBENZ NONE DETECTED 05/12/2017 0439   AMPHETMU NONE DETECTED 05/12/2017 0439   THCU NONE DETECTED 05/12/2017 0439   LABBARB NONE DETECTED 05/12/2017 0439    Alcohol Level:  Recent Labs  Lab 05/11/17 1816  ETH <10    Other results: EKG: normal EKG, normal sinus rhythm, unchanged from previous tracings.  Imaging: Ct Head Wo Contrast  Result Date: 05/14/2017 CLINICAL DATA:  Acute right MCA territory infarct EXAM: CT HEAD WITHOUT CONTRAST TECHNIQUE: Contiguous axial images were obtained from the base of the skull through the vertex without intravenous contrast. COMPARISON:  05/11/2017, 05/13/2017 FINDINGS: Brain: Diffuse hypoattenuation with sulcal effacement throughout the right cerebral hemisphere right MCA distribution involving the frontal, parietal, and temporal lobes as well as the right basal ganglia. There is minimal leftward midline shift measuring 4.4 mm, image 11. Basilar cisterns remain patent. No cerebellar abnormality. No extra-axial fluid collection. Petechial hemorrhage within the basal ganglia better appreciated by MRI on 05/13/2017. Vascular: Previous hyperdense right middle cerebral artery is not as well visualized on today's exam. Skull: Normal. Negative for fracture or focal lesion. Sinuses/Orbits: No acute finding. Other: None. IMPRESSION: Evolving large subacute right MCA territory infarct with minimal leftward midline shift measuring 4 mm. Basal ganglia petechial hemorrhage better appreciated on yesterday's MRI. No large intracranial hemorrhage or extra-axial fluid collection  by noncontrast CT. Electronically Signed   By: Judie Petit.  Shick M.D.   On: 05/14/2017 10:27   [name] Neck W Wo Contrast  Result Date: 05/13/2017 CLINICAL DATA:  52 y/o  F; stroke with weakness for follow-up. EXAM: MR HEAD WITHOUT CONTRAST MRA HEAD WITHOUT CONTRAST MRA OF THE NECK WITHOUT AND WITH CONTRAST TECHNIQUE: Multiplanar, multiecho pulse sequences of the brain and surrounding structures were obtained without intravenous contrast. Angiographic images of the neck were obtained using MRA technique without and with intravenous contrast. Angiographic images of the head were obtained using MRA technique without intravenous contrast. CONTRAST:  8mL MULTIHANCE GADOBENATE DIMEGLUMINE 529 MG/ML IV  SOLN COMPARISON:  05/11/2017 CT head FINDINGS: MR HEAD FINDINGS Brain: Large region of reduced diffusion in the right MCA distribution compatible with subacute infarction as seen on CT involving posterolateral frontal lobe, lateral parietal lobe, superior posterior temporal lobe, insula, lentiform nucleus, caudate nucleus, and posterior limb of internal capsule. The infarct demonstrates T2 FLAIR hypertense signal abnormality with edema edema and mass effect effacing the right lateral ventricle and resulting in 3 mm right-to-left midline shift. Susceptibility hypointensity throughout the right basal ganglia is compatible with petechial hemorrhage. No extra-axial hemorrhage or effacement of basilar cisterns. Vascular: As below. Skull and upper cervical spine: Normal marrow signal. Sinuses/Orbits: Mild left maxillary sinus mucosal thickening. Bilateral mastoid opacification. Orbits are unremarkable. Other: None. MRA HEAD FINDINGS Internal carotid arteries:  Patent. Anterior cerebral arteries:  Patent. Middle cerebral arteries: Patent left. Right distal M1 occlusion and absent flow related signal in M2 sylvian branches. Faint flow is seen within a right pre frontal cortical branch. Anterior communicating artery: Patent. Posterior  communicating arteries: Patent. Increased flow related signal in right PCA distribution in comparison with the left without appreciable stenosis, probably due to compensatory collateral flow due to right MCA infarction. Posterior cerebral arteries:  Patent. Basilar artery:  Patent. Vertebral arteries:  Patent. No additional evidence of high-grade stenosis, large vessel occlusion, or aneurysm unless noted above. MRA NECK FINDINGS Aortic arch: Patent. Right common carotid artery: Patent. Right internal carotid artery: Patent. Right vertebral artery: Patent. Left common carotid artery: Patent. Left Internal carotid artery: Patent. Left Vertebral artery: Patent. There is no evidence of hemodynamically significant stenosis by NASCET criteria, occlusion, or aneurysm unless noted above. IMPRESSION: MRI head: Large right MCA distribution subacute infarction as seen on CT. Petechial hemorrhage in basal ganglia. Mass effect with right lateral ventricle partial effacement and 3 mm right-to-left midline shift. MRA head: 1. Right distal M1 occlusion with absent flow in M2 sylvian branches. Faint flow is seen within a right pre frontal cortical branch. 2. Increased flow in right PCA relative to left without stenosis, probably compensatory collateral flow due to right MCA infarct. MRA neck: Normal MRA of the neck. These results will be called to the ordering clinician or representative by the Radiologist Assistant, and communication documented in the PACS or zVision Dashboard. Electronically Signed   By: Mitzi Hansen M.D.   On: 05/13/2017 13:52   Mr Brain Wo Contrast  Result Date: 05/13/2017 CLINICAL DATA:  52 y/o  F; stroke with weakness for follow-up. EXAM: MR HEAD WITHOUT CONTRAST MRA HEAD WITHOUT CONTRAST MRA OF THE NECK WITHOUT AND WITH CONTRAST TECHNIQUE: Multiplanar, multiecho pulse sequences of the brain and surrounding structures were obtained without intravenous contrast. Angiographic images of the neck  were obtained using MRA technique without and with intravenous contrast. Angiographic images of the head were obtained using MRA technique without intravenous contrast. CONTRAST:  8mL MULTIHANCE GADOBENATE DIMEGLUMINE 529 MG/ML IV SOLN COMPARISON:  05/11/2017 CT head FINDINGS: MR HEAD FINDINGS Brain: Large region of reduced diffusion in the right MCA distribution compatible with subacute infarction as seen on CT involving posterolateral frontal lobe, lateral parietal lobe, superior posterior temporal lobe, insula, lentiform nucleus, caudate nucleus, and posterior limb of internal capsule. The infarct demonstrates T2 FLAIR hypertense signal abnormality with edema edema and mass effect effacing the right lateral ventricle and resulting in 3 mm right-to-left midline shift. Susceptibility hypointensity throughout the right basal ganglia is compatible with petechial hemorrhage. No extra-axial hemorrhage or effacement of basilar cisterns. Vascular: As below. Skull and upper cervical  spine: Normal marrow signal. Sinuses/Orbits: Mild left maxillary sinus mucosal thickening. Bilateral mastoid opacification. Orbits are unremarkable. Other: None. MRA HEAD FINDINGS Internal carotid arteries:  Patent. Anterior cerebral arteries:  Patent. Middle cerebral arteries: Patent left. Right distal M1 occlusion and absent flow related signal in M2 sylvian branches. Faint flow is seen within a right pre frontal cortical branch. Anterior communicating artery: Patent. Posterior communicating arteries: Patent. Increased flow related signal in right PCA distribution in comparison with the left without appreciable stenosis, probably due to compensatory collateral flow due to right MCA infarction. Posterior cerebral arteries:  Patent. Basilar artery:  Patent. Vertebral arteries:  Patent. No additional evidence of high-grade stenosis, large vessel occlusion, or aneurysm unless noted above. MRA NECK FINDINGS Aortic arch: Patent. Right common  carotid artery: Patent. Right internal carotid artery: Patent. Right vertebral artery: Patent. Left common carotid artery: Patent. Left Internal carotid artery: Patent. Left Vertebral artery: Patent. There is no evidence of hemodynamically significant stenosis by NASCET criteria, occlusion, or aneurysm unless noted above. IMPRESSION: MRI head: Large right MCA distribution subacute infarction as seen on CT. Petechial hemorrhage in basal ganglia. Mass effect with right lateral ventricle partial effacement and 3 mm right-to-left midline shift. MRA head: 1. Right distal M1 occlusion with absent flow in M2 sylvian branches. Faint flow is seen within a right pre frontal cortical branch. 2. Increased flow in right PCA relative to left without stenosis, probably compensatory collateral flow due to right MCA infarct. MRA neck: Normal MRA of the neck. These results will be called to the ordering clinician or representative by the Radiologist Assistant, and communication documented in the PACS or zVision Dashboard. Electronically Signed   By: Mitzi Hansen M.D.   On: 05/13/2017 13:52   [name] Head/brain NW Cm  Result Date: 05/13/2017 CLINICAL DATA:  52 y/o  F; stroke with weakness for follow-up. EXAM: MR HEAD WITHOUT CONTRAST MRA HEAD WITHOUT CONTRAST MRA OF THE NECK WITHOUT AND WITH CONTRAST TECHNIQUE: Multiplanar, multiecho pulse sequences of the brain and surrounding structures were obtained without intravenous contrast. Angiographic images of the neck were obtained using MRA technique without and with intravenous contrast. Angiographic images of the head were obtained using MRA technique without intravenous contrast. CONTRAST:  8mL MULTIHANCE GADOBENATE DIMEGLUMINE 529 MG/ML IV SOLN COMPARISON:  05/11/2017 CT head FINDINGS: MR HEAD FINDINGS Brain: Large region of reduced diffusion in the right MCA distribution compatible with subacute infarction as seen on CT involving posterolateral frontal lobe, lateral  parietal lobe, superior posterior temporal lobe, insula, lentiform nucleus, caudate nucleus, and posterior limb of internal capsule. The infarct demonstrates T2 FLAIR hypertense signal abnormality with edema edema and mass effect effacing the right lateral ventricle and resulting in 3 mm right-to-left midline shift. Susceptibility hypointensity throughout the right basal ganglia is compatible with petechial hemorrhage. No extra-axial hemorrhage or effacement of basilar cisterns. Vascular: As below. Skull and upper cervical spine: Normal marrow signal. Sinuses/Orbits: Mild left maxillary sinus mucosal thickening. Bilateral mastoid opacification. Orbits are unremarkable. Other: None. MRA HEAD FINDINGS Internal carotid arteries:  Patent. Anterior cerebral arteries:  Patent. Middle cerebral arteries: Patent left. Right distal M1 occlusion and absent flow related signal in M2 sylvian branches. Faint flow is seen within a right pre frontal cortical branch. Anterior communicating artery: Patent. Posterior communicating arteries: Patent. Increased flow related signal in right PCA distribution in comparison with the left without appreciable stenosis, probably due to compensatory collateral flow due to right MCA infarction. Posterior cerebral arteries:  Patent. Basilar artery:  Patent. Vertebral arteries:  Patent. No additional evidence of high-grade stenosis, large vessel occlusion, or aneurysm unless noted above. MRA NECK FINDINGS Aortic arch: Patent. Right common carotid artery: Patent. Right internal carotid artery: Patent. Right vertebral artery: Patent. Left common carotid artery: Patent. Left Internal carotid artery: Patent. Left Vertebral artery: Patent. There is no evidence of hemodynamically significant stenosis by NASCET criteria, occlusion, or aneurysm unless noted above. IMPRESSION: MRI head: Large right MCA distribution subacute infarction as seen on CT. Petechial hemorrhage in basal ganglia. Mass effect with  right lateral ventricle partial effacement and 3 mm right-to-left midline shift. MRA head: 1. Right distal M1 occlusion with absent flow in M2 sylvian branches. Faint flow is seen within a right pre frontal cortical branch. 2. Increased flow in right PCA relative to left without stenosis, probably compensatory collateral flow due to right MCA infarct. MRA neck: Normal MRA of the neck. These results will be called to the ordering clinician or representative by the Radiologist Assistant, and communication documented in the PACS or zVision Dashboard. Electronically Signed   By: Mitzi Hansen M.D.   On: 05/13/2017 13:52     Assessment/Plan:  52 y.o. female brought in by family after being found on floor as per family since Wednesday but pt states she was on ground for close to 1 week now. Found to have R MCA stroke with edema on CTH along with UTI   Big R MCA stroke - slight midline shift but she is awake and following commands - neglects L side due to parietal lesion and plegic on L side - follow up hypercoagulable panel as out pt - d/c planning today on ASA - [facility]   05/14/2017, 12:13 PM

## 2017-05-14 NOTE — Progress Notes (Signed)
  Speech Language Pathology Treatment: Dysphagia  Patient Details Name: Terri Bender MRN: [id number] DOB: 10-05-1965 Today's Date: 05/14/2017 Time: [id number] SLP Time Calculation (min) (ACUTE ONLY): 60 min  Assessment / Plan / Recommendation Clinical Impression  Pt seen today at lunch meal to give trials for assessing potential upgrade in diet consistency - pt would like to eat more solid foods. Pt continues to present w/ mild-mod Left oral, labial-facial weakness w/ min+ restriction in labial excursion and opening to take bites of food from utensil. Speech articulation is appropriate(100%) but noted min labial closure on the Left side.  Pt consumed trials of mech soft foods w/ adequate mastication and bolus management for A-P transfer then clearing for the majority. Noted inconsistent Left oral pocketing/bolus residue which pt was able to clear w/ lingual and/or finger sweeping - these strategies were practiced the remainder of the meal as oral clearing strategies she needed to implement at every meal. Noted pt also exhibited decreased labial sensation for oral-labial residue - pt was instructed to lick lips and use napkin to wipe/clear mouth of bolus residue. No overt s/s of aspiration were noted w/ the increased textured foods; pt did exhibit mild, delayed cough x1 w/ trials of mixed textured foods(veg. Soup). Pt fed self w/ support and verbal/tactile cues to attend to meal items on Left side of tray.  Education was given to pt re: diet consistency, aspiration precautions; strategies to promote oral clearing esp. on Left side. Discussion of oral-labial exs; handouts given on precautions, strategies, and instructions to be continued as she discharges to Rehab today(per NSG/CM report). ST services ordered to f/u w/ pt there for ongoing dysphagia tx and Cognitive-linguistic tx. MD updated and agreed.     HPI HPI: Pt is a 52 year old female admitted 05/11/17 after being found down at home, right side  weakness. PMH significant for anxiety, depression, HTN, headaches, Tobacco and alcohol use at home. MRI revealed Large R MCA distribution - Neurologist stated she is probably 5 days or so out from stroke; also noted small Petechial hemorrhage in basal ganglia w/ mass effect with right lateral ventrical.       SLP Plan  Continue with current plan of care  Patient needs continued Speech Lanaguage Pathology Services    Recommendations  Diet recommendations: Dysphagia 3 (mechanical soft);Thin liquid Liquids provided via: Cup;No straw Medication Administration: Whole meds with puree Supervision: Patient able to self feed;Intermittent supervision to cue for compensatory strategies(cues) Compensations: Minimize environmental distractions;Lingual sweep for clearance of pocketing;Slow rate;Small sips/bites;Multiple dry swallows after each bite/sip;Follow solids with liquid Postural Changes and/or Swallow Maneuvers: Seated upright 90 degrees;Upright 30-60 min after meal  Monitor Mixed Consistency foods and lessen if overt s/s of aspiration noted                General recommendations: Rehab consult Oral Care Recommendations: Oral care BID;Patient independent with oral care;Staff/trained caregiver to provide oral care Follow up Recommendations: Skilled Nursing facility(pt is d/t discharge today per MD/CM) SLP Visit Diagnosis: Dysphagia, oral phase (R13.11) Attention and concentration deficit following: Cerebral infarction Plan: Continue with current plan of care       GO               Terri SomKatherine Roya Gieselman, MS, CCC-SLP Terri Bender 05/14/2017, 3:41 PM

## 2017-05-14 NOTE — Discharge Summary (Addendum)
[facility] [place] at [facility]   PATIENT NAME: Terri Bender    MR#:  [id number]  DATE OF BIRTH:  20-Apr-1966  DATE OF ADMISSION:  05/11/2017 ADMITTING PHYSICIAN: Gery Pray, MD  DATE OF DISCHARGE: 05/16/2017  PRIMARY CARE PHYSICIAN: Emi Belfast, FNP    ADMISSION DIAGNOSIS:  Leukocytosis [D72.829] Elevated troponin [R74.8] Acute ischemic right MCA stroke (HCC) [I63.511]  DISCHARGE DIAGNOSIS:  Principal Problem:   CVA (cerebral vascular accident) (HCC) Active Problems:   Smoker   HTN (hypertension)   SECONDARY DIAGNOSIS:   Past Medical History:  Diagnosis Date  . Anxiety   . Depression   . Frequent headaches   . History of chicken pox   . Hypertension     HOSPITAL COURSE:   52 year old female with past medical history of hypertension, anxiety/depression, tobacco abuse who presented to the hospital as she was found down by her family and noted to have an acute CVA.  1. Acute CVA-patient has a large right-sided MCA distribution stroke.  She presented with dense left-sided hemiparesis with neglect.   -Patient's MRI/MRA of the brain did confirm a large right sided MCA stroke.  Patient had a small petechial hemorrhage in the right basal ganglia.  She had a mild midline shift.  Patient was seen by neurology and they did not recommend any osmotic therapy as patient stroke was at least 5 days out. - Pt. MRA Neck showed no hemodynamically significant stenosis and echo showed normal EF with no intra-cardiac thrombus.   -Clinically patient still has significant dense left-sided hemiparesis.  Initially she was on aspirin per rectum, but passed her swallow eval and currently is taking oral aspirin and statin.  She was seen by physical therapy and occupational therapy and they recommended short-term rehab which is where she is presently being discharged.  2. Urinary tract infection- pt. Has adequately been treated with IV Ceftriaxone in the hospital and  her Urine cultures have remained negative.  Clinically asymptomatic now.   3. Essential hypertension- patient was on IV metoprolol as she could not take a whole oral meds.  She is presently being discharged on her oral lisinopril. - BP has been stable.   4. Hypokalemia/Hypomagnesemia -this has been supplemented and it has improved.  Patient is being discharged on some oral potassium supplements.  5.  Anxiety/depression-patient will resume her Klonopin, Lexapro.  Patient is being discharged to a skilled nursing facility for ongoing care.  DISCHARGE CONDITIONS:   Stable.   CONSULTS OBTAINED:  Treatment Team:  Pauletta Browns, MD  DRUG ALLERGIES:   Allergies  Allergen Reactions  . Erythromycin Nausea And Vomiting  . Septra [Sulfamethoxazole-Trimethoprim] Hives    DISCHARGE MEDICATIONS:   Allergies as of 05/16/2017      Reactions   Erythromycin Nausea And Vomiting   Septra [sulfamethoxazole-trimethoprim] Hives      Medication List    STOP taking these medications   ibuprofen 200 MG tablet Commonly known as:  ADVIL,MOTRIN   potassium chloride SA 20 MEQ tablet Commonly known as:  K-DUR,KLOR-CON     TAKE these medications   aspirin 325 MG EC tablet Take 1 tablet (325 mg total) by mouth daily.   atorvastatin 80 MG tablet Commonly known as:  LIPITOR Take 1 tablet (80 mg total) by mouth daily at 6 PM.   cefUROXime 250 MG tablet Commonly known as:  CEFTIN Take 1 tablet (250 mg total) by mouth 2 (two) times daily with a meal for 2 days.   Cholecalciferol  5000 units capsule Take 1 capsule (5,000 Units total) by mouth daily.   clonazePAM 0.5 MG tablet Commonly known as:  KLONOPIN Take 1 tablet (0.5 mg total) by mouth 2 (two) times daily as needed for anxiety.   escitalopram 10 MG tablet Commonly known as:  LEXAPRO Take 1 tablet (10 mg total) by mouth daily.   feeding supplement (ENSURE ENLIVE) Liqd Take 237 mLs by mouth 2 (two) times daily between meals.    lisinopril 10 MG tablet Commonly known as:  PRINIVIL,ZESTRIL Take 1 tablet (10 mg total) by mouth daily.   potassium chloride 20 MEQ packet Commonly known as:  KLOR-CON Take 20 mEq by mouth 2 (two) times daily for 3 days.         DISCHARGE INSTRUCTIONS:   DIET:  Cardiac diet  Dysphagia III with chopped meats with thin liquids.  Aspiration precautions and speech to cont. To follow pt. At the SNF.   DISCHARGE CONDITION:  Stable  ACTIVITY:  Activity as tolerated  OXYGEN:  Home Oxygen: No.   Oxygen Delivery: room air  DISCHARGE LOCATION:  nursing home   If you experience worsening of your admission symptoms, develop shortness of breath, life threatening emergency, suicidal or homicidal thoughts you must seek medical attention immediately by calling 911 or calling your MD immediately  if symptoms less severe.  You Must read complete instructions/literature along with all the possible adverse reactions/side effects for all the Medicines you take and that have been prescribed to you. Take any new Medicines after you have completely understood and accpet all the possible adverse reactions/side effects.   Please note  You were cared for by a hospitalist during your hospital stay. If you have any questions about your discharge medications or the care you received while you were in the hospital after you are discharged, you can call the unit and asked to speak with the hospitalist on call if the hospitalist that took care of you is not available. Once you are discharged, your primary care physician will handle any further medical issues. Please note that NO REFILLS for any discharge medications will be authorized once you are discharged, as it is imperative that you return to your primary care physician (or establish a relationship with a primary care physician if you do not have one) for your aftercare needs so that they can reassess your need for medications and monitor your lab  values.     Today   No further acute events overnight.  Still has left sided hemiparesis. No headache, N/V or any other associated symptoms. Stable for discharge to SNF.   VITAL SIGNS:  Blood pressure 114/72, pulse 69, temperature 98.3 F (36.8 C), temperature source Oral, resp. rate 18, height 5\' 2"  (1.575 m), weight 41.1 kg (90 lb 9.6 oz), SpO2 100 %.  I/O:    Intake/Output Summary (Last 24 hours) at 05/16/2017 1103 Last data filed at 05/15/2017 1858 Gross per 24 hour  Intake 480 ml  Output -  Net 480 ml    PHYSICAL EXAMINATION:   GENERAL:  52 y.o.-year-old patient lying in bed in no acute distress.  EYES: Pupils equal, round, reactive to light and accommodation. No scleral icterus. Extraocular muscles intact.  HEENT: Head atraumatic, normocephalic. Oropharynx and nasopharynx clear.  NECK:  Supple, no jugular venous distention. No thyroid enlargement, no tenderness.  LUNGS: Normal breath sounds bilaterally, no wheezing, rales, rhonchi. No use of accessory muscles of respiration.  CARDIOVASCULAR: S1, S2 normal. No murmurs, rubs, or gallops.  ABDOMEN: Soft, nontender, nondistended. Bowel sounds present. No organomegaly or mass.  EXTREMITIES: No cyanosis, clubbing or edema b/l.    NEUROLOGIC: Cranial nerves II through XII are intact. Dense Left sided hemi-paresis.   PSYCHIATRIC: The patient is alert and oriented x 3.  SKIN: No obvious rash, lesion, or ulcer.   DATA REVIEW:   CBC Recent Labs  Lab 05/13/17 0410  WBC 6.5  HGB 11.1*  HCT 32.1*  PLT 200    Chemistries  Recent Labs  Lab 05/11/17 1816 05/13/17 0410  05/14/17 0440  NA 136 138  --   --   K 4.2 2.5*  --  3.4*  CL 97* 109  --   --   CO2 25 20*  --   --   GLUCOSE 117* 129*  --   --   BUN 17 11  --   --   CREATININE 0.63 0.59  --   --   CALCIUM 10.3 8.3*  --   --   MG  --   --    < > 2.0  AST 80*  --   --   --   ALT 30  --   --   --   ALKPHOS 74  --   --   --   BILITOT 1.2  --   --   --    < > =  values in this interval not displayed.    Cardiac Enzymes Recent Labs  Lab 05/12/17 1710  TROPONINI 0.12*    Microbiology Results  Results for orders placed or performed during the hospital encounter of 05/11/17  Culture, blood (Routine X 2) w Reflex to ID Panel     Status: None   Collection Time: 05/11/17  8:14 PM  Result Value Ref Range Status   Specimen Description BLOOD RIGHT ANTECUBITAL  Final   Special Requests   Final    BOTTLES DRAWN AEROBIC AND ANAEROBIC Blood Culture adequate volume   Culture   Final    NO GROWTH 5 DAYS Performed at [facility], [address] [place], [place] [address]    Report Status 05/16/2017 FINAL  Final  Urine Culture     Status: Abnormal   Collection Time: 05/12/17  4:39 AM  Result Value Ref Range Status   Specimen Description   Final    URINE, RANDOM Performed at [facility], [address] [place], [place] [address]    Special Requests   Final    NONE Performed at [facility], [address] [place], [place] [address]    Culture MULTIPLE SPECIES PRESENT, SUGGEST RECOLLECTION (A)  Final   Report Status 05/15/2017 FINAL  Final  C difficile quick scan w PCR reflex     Status: None   Collection Time: 05/14/17  5:14 PM  Result Value Ref Range Status   C Diff antigen NEGATIVE NEGATIVE Final   C Diff toxin NEGATIVE NEGATIVE Final   C Diff interpretation No C. difficile detected.  Final    Comment: Performed at [facility], [address] [place], [place] [address]    RADIOLOGY:  No results found.   Management plans discussed with the patient, family and they are in agreement.  CODE STATUS:     Code Status Orders  (From admission, onward)        Start     Ordered   05/11/17 2131  Full code  Continuous     05/11/17 2130    Code  Status History    Date Active Date Inactive Code Status Order ID Comments User Context   This patient has a current code status but no historical code  status.      TOTAL TIME TAKING CARE OF THIS PATIENT: 40 minutes.    Houston Siren M.D on 05/16/2017 at 11:03 AM  Between 7am to 6pm - Pager - [phone]  After 6pm go to [email or web address] - Social research officer, government  [facility]  Office  [phone]  CC: Primary care physician; Emi Belfast, FNP

## 2017-05-14 NOTE — Progress Notes (Signed)
SNF  Benefits:  Number called: [phone] Rep: Sam Reference Number: [id number]  [facility] Choice Plus Plan active as of 10/27/16.  Family in-network deductible is $3,000, of which $0 met. Family in-network out of pocket max is $5900, of which $0 met so far.  In-network SNF: after satisfying the family in-network deductible of $3,000, responsible for a 20% co-insurance until family out of pocket satisfied.   Limited to 120 SNF days per calendar year.   Auth required: [id number].

## 2017-05-14 NOTE — Progress Notes (Signed)
Physical Therapy Treatment Patient Details Name: Terri FoundLisa S Bender MRN: [id number] DOB: 1965-07-10 Today's Date: 05/14/2017    History of Present Illness Pt admitted following CVA.  PMH includes Htn, frequent HA, depression and anxiety.    PT Comments    Pt ready and motivated for session. "I want to walk to the bathroom".  Participated in exercises as described below.   Pt requires assist for all exercised but does have AROM noted for most ranges.  To edge of bed with mod a x 1.  She is able to sit for 5 minutes with emphasis on balance and postural awareness.  Pt does have forward and sideways LOB's which make her unsafe to sit unattended.  Verbal cues to hold head high.  Pt was able to stand at edge of bed x 4 minutes with min/mod a x 2.  She leans heavily to left and requires verbal and tactile cues to shift weight to right.  She corrects well with cues but lean does increase as she fatigues.  Attempted steps but she is unable to bear full weight on LLE due to weakness and L knee buckling with attempts.  She did however attempt to pick LLE up but was uanble to clear the floor or reposition it without assist.  She is making overall progress and voices determination to regain her mobility skills. "I am stubborn".  Repositioned in bed for lunch.   Follow Up Recommendations  SNF     Equipment Recommendations       Recommendations for Other Services       Precautions / Restrictions Precautions Precautions: Fall Restrictions Weight Bearing Restrictions: No    Mobility  Bed Mobility Overal bed mobility: Needs Assistance Bed Mobility: Supine to Sit;Sit to Supine     Supine to sit: Mod assist Sit to supine: Mod assist;+2 for physical assistance      Transfers Overall transfer level: Needs assistance Equipment used: 2 person hand held assist Transfers: Sit to/from Stand Sit to Stand: Mod assist;+2 physical assistance;Min assist         General transfer comment: leans to left  initially and needs mod verbal cues to correct  Ambulation/Gait             General Gait Details: unable due to weakness in LLE   Stairs            Wheelchair Mobility    Modified Rankin (Stroke Patients Only)       Balance Overall balance assessment: Needs assistance Sitting-balance support: Feet supported Sitting balance-Leahy Scale: Poor Sitting balance - Comments: Overall poor sitting balance, forward LOB and to right.  Verbal and tactile cues to maintain balance.  Unsafe to be left unattended in sitting. Postural control: Right lateral lean Standing balance support: Single extremity supported Standing balance-Leahy Scale: Poor Standing balance comment: Requires +2 assist at all times for standing due to balance and left lean                            Cognition Arousal/Alertness: Awake/alert Behavior During Therapy: WFL for tasks assessed/performed Overall Cognitive Status: Within Functional Limits for tasks assessed                                        Exercises Other Exercises Other Exercises: supine strengthening exercises focusing on LLE for ankle pumps, heel slides,  SLR, ab/add ans SAQ AAROM x 10 Other Exercises: sitting balance edge of bed x 5 minutes with empahsis on body awarenss and balance.    General Comments        Pertinent Vitals/Pain Pain Assessment: No/denies pain    Home Living     Available Help at Discharge: Family(boyfriend she stated) Type of Home: Apartment              Prior Function            PT Goals (current goals can now be Bender in the care plan section) Progress towards PT goals: Progressing toward goals    Frequency    7X/week      PT Plan Current plan remains appropriate    Co-evaluation              AM-PAC PT "6 Clicks" Daily Activity  Outcome Measure  Difficulty turning over in bed (including adjusting bedclothes, sheets and blankets)?: Unable Difficulty  moving from lying on back to sitting on the side of the bed? : Unable Difficulty sitting down on and standing up from a chair with arms (e.g., wheelchair, bedside commode, etc,.)?: Unable Help needed moving to and from a bed to chair (including a wheelchair)?: Total Help needed walking in hospital room?: Total Help needed climbing 3-5 steps with a railing? : Total 6 Click Score: 6    End of Session Equipment Utilized During Treatment: Gait belt Activity Tolerance: Patient tolerated treatment well Patient left: in bed;with bed alarm set;with call bell/phone within reach   Hemiplegia - Right/Left: Left Hemiplegia - caused by: Cerebral infarction     Time: 1610-9604 PT Time Calculation (min) (ACUTE ONLY): 23 min  Charges:  $Therapeutic Exercise: 8-22 mins $Therapeutic Activity: 8-22 mins                    G Codes:      Danielle Dess, PTA 05/14/17, 12:25 PM

## 2017-05-14 NOTE — Progress Notes (Signed)
C-diff panel came back negative. Orders to give Imodium. Will continue to monitor.

## 2017-05-14 NOTE — Evaluation (Deleted)
Clinical/Bedside Swallow Evaluation Patient Details  Name: Terri Bender MRN: [id number] Date of Birth: 09-26-1965  Today's Date: 05/14/2017 Time: SLP Start Time (ACUTE ONLY): 0802 SLP Stop Time (ACUTE ONLY): 0902 SLP Time Calculation (min) (ACUTE ONLY): 60 min  Past Medical History:  Past Medical History:  Diagnosis Date  . Anxiety   . Depression   . Frequent headaches   . History of chicken pox   . Hypertension    Past Surgical History:  Past Surgical History:  Procedure Laterality Date  . CESAREAN SECTION    . FOOT SURGERY Left    bone spur   HPI:  Pt is a 52 year old female admitted 05/11/17 after being found down at home, right side weakness. PMH significant for anxiety, depression, HTN, headaches, Tobacco and alcohol use at home. MRI revealed Large R MCA distribution - Neurologist stated she is probably 5 days or so out from stroke; also noted small Petechial hemorrhage in basal ganglia w/ mass effect with right lateral ventrical.    Assessment / Plan / Recommendation Clinical Impression  Pt appears to present w/ Moderate Cognitive deficits moreso w/ tasks of basic-moderate complexity. This Cognitive presentation appears to impact her Linguistic abilities in areas of Recall and Elaboration of information w/out Perseverations, and providing correct information d/t Memory deficits. Pt was able to immediately Repeat information but exhibited deficits in short term Memory tasks of recall - accuracy was improved when given verbal, Semantic cues. Pt was able to maintain Attention during tasks but exhibited deficits during Number tasks of increased complexity. Pt exhibited most difficulty in Visuospatial and Executive tasks and the Clock drawing task - pt was Impulsive and did not follow Sequencing or Ordering of task information though encouraged to slow down and listen to instructions. Pt does need her glasses for any visual/written tasks. Pt was Oriented x4; she was able to give  biopersonal information re: self. Of note, upon attempting to use her cell phone, she appeared to become confused as to finding the correct screen to check her messages. She was able to verbalize choices for lunch meal w/ given options by SLP. Her Expressive and Receptive language skills appeared grossly WFL. Pt does present w/ mild Left Oral labial-facial weakness but lingual strength/ROM appeared [facility] for bolus management today. Will attempt trials to upgrade food consistency in diet; NSG reported good toleration of thin liquids and Pills in Puree.  Pt will benefit from further skilled ST services to improve Cognitive-Linguistic skills for communication and independence of ADLs; pt would benefit from continued education and intervention to address Left Neglect as the neglect will impact ADLs including finding items on meal tray, walking for example.  SLP Visit Diagnosis: Attention and concentration deficit;Frontal lobe and executive function deficit;Cognitive communication deficit (R41.841) Attention and concentration deficit following: Cerebral infarction Frontal lobe and executive function deficit following: Cerebral infarction    Aspiration Risk       Diet Recommendation          Other  Recommendations     Follow up Recommendations Skilled Nursing facility(pt is d/t discharge today per MD/CM)      Frequency and Duration min 3x week          Prognosis        Swallow Study   General HPI: Pt is a 52 year old female admitted 05/11/17 after being found down at home, right side weakness. PMH significant for anxiety, depression, HTN, headaches, Tobacco and alcohol use at home. MRI revealed Large  R MCA distribution - Neurologist stated she is probably 5 days or so out from stroke; also noted small Petechial hemorrhage in basal ganglia w/ mass effect with right lateral ventrical.     Oral/Motor/Sensory Function Overall Oral Motor/Sensory Function: Moderate impairment Facial ROM: Reduced  left(mild) Facial Symmetry: Abnormal symmetry left(mild) Facial Strength: Reduced left(mild) Facial Sensation: Within Functional Limits Lingual ROM: Within Functional Limits(grossly) Lingual Symmetry: Abnormal symmetry left(slight) Lingual Strength: Within Functional Limits(grossly) Lingual Sensation: Within Functional Limits Velum: Within Functional Limits Mandible: Within Functional Limits   Ice Chips     Thin Liquid      Nectar Thick     Honey Thick     Puree     Solid   GO              Jerilynn SomKatherine Nohlan Burdin, MS, CCC-SLP Jehiel Koepp 05/14/2017,10:42 AM

## 2017-05-14 NOTE — Progress Notes (Signed)
OT Cancellation Note  Patient Details Name: Terri Bender Kathol MRN: [id number] DOB: 08/14/1965   Cancelled Treatment:    Reason Eval/Treat Not Completed: Other (comment). Chart reviewed. MRI noting small right to left midline shift and petechial hemorrhage. Head CT ordered. Will hold OT evaluation until CT has been completed to ensure no additional midline shift. Will continue to follow acutely for appropriateness of therapy.   Richrd PrimeJamie Stiller, MPH, MS, OTR/L ascom [phone] 05/14/17, 8:44 AM

## 2017-05-14 NOTE — Progress Notes (Signed)
Pt has had 3 loose bowel movements. Dr. Cherlynn KaiserSainani notified, orders received to collect c-diff panel. Sample sent to lab. Will continue to monitor.

## 2017-05-14 NOTE — Evaluation (Signed)
Occupational Therapy Evaluation Patient Details Name: Terri Bender MRN: [id number] DOB: 20-Jun-1965 Today's Date: 05/14/2017    History of Present Illness Pt admitted following R MCA CVA.  PMH includes Htn, frequent HA, depression and anxiety.   Clinical Impression   Pt seen for OT evaluation this date. Pt. Is a 52 y.o. Female was admitted for a R MCA CVA with Left sided weakness. Pt. presents with significant strength deficits in LUE and LLE, decreased functional LUE use, no active volitional movement elicited (flaccid other than 1/5 for shoulder elevation), impaired cognition (decreased safety awareness, neglect of L side, requiring VC for following simple commands), impaired vision, and decreased functional mobility which hinder her ability to complete ADL and IADL tasks. At baseline, pt was independent with all ADL, IADL, and mobility, working full time. Pt is motivated and eager to return to PLOF. Patient will benefit from skilled OT services to work on LUE neuro muscular re-ed, therapeutic exercises, positioning, balance training, body awareness training, and pt./family education. Pt education was provided about stroke recovery, LUE  Self ROM, and positioning. Pt able to demo understanding. Patient would be a good candidate to continue OT services at [facility].     Follow Up Recommendations  SNF    Equipment Recommendations  Other (comment)(TBD)    Recommendations for Other Services       Precautions / Restrictions Precautions Precautions: Fall Restrictions Weight Bearing Restrictions: No      Mobility Bed Mobility Overal bed mobility: Needs Assistance Bed Mobility: Supine to Sit;Sit to Supine     Supine to sit: Mod assist Sit to supine: Mod assist;+2 for physical assistance   General bed mobility comments: scooting up in bed, with verbal cues for R side positioning to support maximal participation using bed rail, still requiring max assist from therapist to scoot up in  bed  Transfers Overall transfer level: Needs assistance Equipment used: 2 person hand held assist Transfers: Sit to/from Stand Sit to Stand: Mod assist;+2 physical assistance;Min assist         General transfer comment: leans to left initially and needs mod verbal cues to correct    Balance Overall balance assessment: History of Falls;Needs assistance Sitting-balance support: Feet supported Sitting balance-Leahy Scale: Poor Sitting balance - Comments: Overall poor sitting balance, forward LOB and to right.  Verbal and tactile cues to maintain balance.  Unsafe to be left unattended in sitting. Postural control: Right lateral lean Standing balance support: Single extremity supported Standing balance-Leahy Scale: Poor Standing balance comment: Requires +2 assist at all times for standing due to balance and left lean                           ADL either performed or assessed with clinical judgement   ADL Overall ADL's : Needs assistance/impaired Eating/Feeding: Set up;Minimal assistance;Supervision/ safety;Cueing for safety;Bed level   Grooming: Bed level;Set up;Supervision/safety   Upper Body Bathing: Moderate assistance;Bed level   Lower Body Bathing: Maximal assistance;Moderate assistance;Bed level   Upper Body Dressing : Bed level;Maximal assistance;Moderate assistance   Lower Body Dressing: Maximal assistance;Bed level     Toilet Transfer Details (indicate cue type and reason): unsafe to attempt                 Vision Baseline Vision/History: Wears glasses Wears Glasses: Reading only Patient Visual Report: Peripheral vision impairment Vision Assessment?: Yes Ocular Range of Motion: Within Functional Limits Alignment/Gaze Preference: Within Defined Limits Tracking/Visual Pursuits: Requires  cues, head turns, or add eye shifts to track Visual Fields: Impaired-to be further tested in functional context(possible L inferior quadrant visual deficit, to  further assess)     Perception     Praxis      Pertinent Vitals/Pain Pain Assessment: 0-10 Pain Score: 6  Pain Location: headache Pain Descriptors / Indicators: Aching Pain Intervention(s): Limited activity within patient's tolerance;Monitored during session     Hand Dominance Right   Extremity/Trunk Assessment Upper Extremity Assessment Upper Extremity Assessment: LUE deficits/detail(RUE WFL) LUE Deficits / Details: shoulder elevation activation 1/5, otherwise flaccid, impaired sensation, possible mild neglect to L? LUE Sensation: decreased light touch;decreased proprioception LUE Coordination: decreased fine motor;decreased gross motor   Lower Extremity Assessment Lower Extremity Assessment: LLE deficits/detail(RLE WFL) LLE Deficits / Details: LLE flaccid, impaired sensation, possible mild neglect to L? LLE Sensation: decreased light touch;decreased proprioception LLE Coordination: decreased gross motor       Communication Communication Communication: No difficulties   Cognition Arousal/Alertness: Awake/alert Behavior During Therapy: WFL for tasks assessed/performed Overall Cognitive Status: Impaired/Different from baseline Area of Impairment: Following commands;Problem solving;Safety/judgement                       Following Commands: Follows one step commands inconsistently;Follows multi-step commands inconsistently(verbal cues required) Safety/Judgement: Decreased awareness of safety;Decreased awareness of deficits   Problem Solving: Requires verbal cues     General Comments       Exercises Other Exercises Other Exercises: pt educated in LUE positioning and joint protection to maximize safety and minimize edema and swelling to LUE. Pt demo'd understanding. Other Exercises: Pt had question about participation in sexual activity following a stroke. Provided basic education in role of OT with all meaningful occupations, including sex, and provided  examples for modifications and compensatory strategies that could be made based on pt's functional status   Shoulder Instructions      Home Living Family/patient expects to be discharged to:: Private residence Living Arrangements: Children Available Help at Discharge: Family(boyfriend) Type of Home: Other(Comment)(townhome) Home Access: Stairs to enter Entrance Stairs-Number of Steps: 20 stairs in home total (flat entry into home, 20 steps to 2nd fl) Entrance Stairs-Rails: Can reach both Home Layout: Two level     Bathroom Shower/Tub: Chief Strategy Officer: Standard     Home Equipment: None          Prior Functioning/Environment Level of Independence: Independent        Comments: 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 [employer]        OT Problem List: Decreased strength;Decreased range of motion;Increased edema;Decreased knowledge of use of DME or AE;Impaired vision/perception;Decreased activity tolerance;Decreased cognition;Impaired UE functional use;Pain;Impaired sensation;Decreased safety awareness;Impaired balance (sitting and/or standing);Decreased coordination      OT Treatment/Interventions: Self-care/ADL training;Balance training;Therapeutic exercise;Therapeutic activities;Neuromuscular education;Cognitive remediation/compensation;Visual/perceptual remediation/compensation;DME and/or AE instruction;Patient/family education    OT Goals(Current goals can be found in the care plan section) Acute Rehab OT Goals Patient Stated Goal: participate in therapy to get better OT Goal Formulation: With patient Time For Goal Achievement: 05/28/17 Potential to Achieve Goals: Good  OT Frequency: Min 5X/week   Barriers to D/C: Inaccessible home environment;Decreased caregiver support          Co-evaluation              AM-PAC PT "6 Clicks" Daily Activity     Outcome Measure Help from another person eating meals?: A Little Help from another person taking care  of personal grooming?:  A Little Help from another person toileting, which includes using toliet, bedpan, or urinal?: A Lot Help from another person bathing (including washing, rinsing, drying)?: A Lot Help from another person to put on and taking off regular upper body clothing?: A Lot Help from another person to put on and taking off regular lower body clothing?: A Lot 6 Click Score: 14   End of Session    Activity Tolerance: Patient tolerated treatment well Patient left: in bed;with call bell/phone within reach;with bed alarm set  OT Visit Diagnosis: Other abnormalities of gait and mobility (R26.89);Other symptoms and signs involving cognitive function;Hemiplegia and hemiparesis Hemiplegia - Right/Left: Left Hemiplegia - dominant/non-dominant: Non-Dominant Hemiplegia - caused by: Cerebral infarction                Time: [id number] OT Time Calculation (min): 25 min Charges:  OT General Charges $OT Visit: 1 Visit OT Evaluation $OT Eval Moderate Complexity: 1 Mod OT Treatments $Therapeutic Activity: 8-22 mins  Richrd PrimeJamie Stiller, MPH, MS, OTR/L ascom [phone] 05/14/17, 2:15 PM

## 2017-05-15 LAB — URINE CULTURE

## 2017-05-15 MED ORDER — ATORVASTATIN CALCIUM 20 MG PO TABS
80.0000 mg | ORAL_TABLET | Freq: Every day | ORAL | Status: DC
  Administered 2017-05-15: 80 mg via ORAL
  Filled 2017-05-15: qty 4

## 2017-05-15 NOTE — Clinical Social Work Note (Signed)
CSW spoke to patient's [facility] who [facility], and patient has agreed to pay the deductible cost that SNF is requesting once she is admitted to SNF.  SNF is going to start insurance authorization, awaiting approval for patient to go to SNF.  CSW continuing to follow patient's progress throughout discharge planning.  Ervin KnackEric R. Sumner Boesch, MSW, Theresia MajorsLCSWA [phone]  05/15/2017 4:12 PM

## 2017-05-15 NOTE — Plan of Care (Signed)
  Progressing Education: Knowledge of disease or condition will improve 05/15/2017 1954 - Progressing by Kalman JewelsBallentine, Lil Lepage, RN Knowledge of patient specific risk factors addressed and post discharge goals established will improve 05/15/2017 1954 - Progressing by Kalman JewelsBallentine, Italo Banton, RN Education: Knowledge of General Education information will improve 05/15/2017 1954 - Progressing by Kalman JewelsBallentine, Falana Clagg, RN Clinical Measurements: Ability to maintain clinical measurements within normal limits will improve 05/15/2017 1954 - Progressing by Kalman JewelsBallentine, Shaan Rhoads, RN Will remain free from infection 05/15/2017 1954 - Progressing by Kalman JewelsBallentine, Heiley Shaikh, RN Diagnostic test results will improve 05/15/2017 1954 - Progressing by Kalman JewelsBallentine, Ghazi Rumpf, RN Respiratory complications will improve 05/15/2017 1954 - Progressing by Kalman JewelsBallentine, Ottavio Norem, RN Cardiovascular complication will be avoided 05/15/2017 1954 - Progressing by Kalman JewelsBallentine, Tenicia Gural, RN Activity: Risk for activity intolerance will decrease 05/15/2017 1954 - Progressing by Kalman JewelsBallentine, Gessica Jawad, RN Nutrition: Adequate nutrition will be maintained 05/15/2017 1954 - Progressing by Kalman JewelsBallentine, Junette Bernat, RN Coping: Level of anxiety will decrease 05/15/2017 1954 - Progressing by Kalman JewelsBallentine, Yvanna Vidas, RN Elimination: Will not experience complications related to bowel motility 05/15/2017 1954 - Progressing by Kalman JewelsBallentine, Marshelle Bilger, RN Will not experience complications related to urinary retention 05/15/2017 1954 - Progressing by Kalman JewelsBallentine, Tomoki Lucken, RN

## 2017-05-15 NOTE — Progress Notes (Signed)
Neurology:  Long discussion with pt's mother over phone. Current treatment, plan and imaging discussed.  Plan of care explained and further treatment.  Pt has chronic use of ETOH and smoker which likely contributed to stroke  All her questions answered.

## 2017-05-15 NOTE — Progress Notes (Signed)
  Speech Language Pathology Treatment: Dysphagia  Patient Details Name: Terri Bender MRN: [id number] DOB: February 25, 1966 Today's Date: 05/15/2017 Time: [id number] SLP Time Calculation (min) (ACUTE ONLY): 38 min  Assessment / Plan / Recommendation Clinical Impression  Pt seen this morning at breakfast meal to for ongoing assessment of toleration of diet upgrade since yesterday - now on a mech soft diet consistency but does continue to have decreased Left oral sensation and awareness. Pt continues to present w/ mild-mod Left oral, labial-facial weakness w/ min+ restriction in labial excursion and opening to take bites of food from utensil. Speech articulation is appropriate(100%) but noted min+ labial closure in the Left corner of mouth.  Pt consumed trials of mech soft foods w/ adequate mastication and bolus management for A-P transfer then clearing for the majority of trials. Noted inconsistent Left oral pocketing/bolus residue which pt was able to clear w/ lingual and/or finger sweeping - these strategies were practiced during the meal as oral clearing strategies she would need to implement at every meal even post d/c. Noted pt also exhibited decreased labial sensation for oral-labial residue remaining b/t trials - pt was instructed to lick lips and use napkin to wipe/clear mouth of bolus residue. No overt s/s of aspiration were noted w/ the increased textured foods. Pt fed self w/ support and verbal/tactile cues to attend to meal items on Left side of tray. Pt does have some decreased awareness of deficits it appears. Education was given to pt re: diet consistency, aspiration precautions; strategies to promote oral clearing esp. on Left side. Discussion of oral-labial exs; handouts on precautions, strategies, and instructions reviewed and recommended as she discharges to Rehab today(per NSG/CM report). Pt would benefit from dysphagia tx targeting Oral Motor exs, strategies and follow through w/ such. ST  services have been ordered for pt for dysphagia tx and Cognitive-linguistic tx at Rehab. MD/NSG updated and agreed.    HPI HPI: Pt is a 52 year old female admitted 05/11/17 after being found down at home, right side weakness. PMH significant for anxiety, depression, HTN, headaches, Tobacco and alcohol use at home. MRI revealed Large R MCA distribution - Neurologist stated she is probably 5 days or so out from stroke; also noted small Petechial hemorrhage in basal ganglia w/ mass effect with right lateral ventrical.       SLP Plan  Continue with current plan of care       Recommendations  Diet recommendations: Dysphagia 3 (mechanical soft);Thin liquid Liquids provided via: Cup;No straw Medication Administration: Whole meds with puree Supervision: Patient able to self feed;Intermittent supervision to cue for compensatory strategies Compensations: Minimize environmental distractions;Lingual sweep for clearance of pocketing;Slow rate;Small sips/bites;Multiple dry swallows after each bite/sip;Follow solids with liquid Postural Changes and/or Swallow Maneuvers: Seated upright 90 degrees;Upright 30-60 min after meal                Oral Care Recommendations: Oral care BID;Patient independent with oral care;Staff/trained caregiver to provide oral care Follow up Recommendations: Skilled Nursing facility SLP Visit Diagnosis: Dysphagia, oral phase (R13.11) Attention and concentration deficit following: Cerebral infarction Frontal lobe and executive function deficit following: Cerebral infarction Plan: Continue with current plan of care       GO               Jerilynn SomKatherine Watson, MS, CCC-SLP Watson,Katherine 05/15/2017, 6:34 PM

## 2017-05-15 NOTE — Clinical Social Work Note (Signed)
CSW spoke with patient's mother Terri BeltonDianne [phone] to discuss SNF benefits and deductible per patient's insurance company.  Patient's mother said patient has access to the funds to help pay her deductible for SNF.  Patient's [facility] and speak with the business office to discuss payment up front.  [facility] who will speak with patient's mother and then start insurance authorization.  CSW to continue to follow patient's progress throughout discharge planning.  Terri KnackEric R. Harsha Bender, MSW, Theresia MajorsLCSWA [phone]  05/15/2017 8:34 AM

## 2017-05-15 NOTE — Progress Notes (Signed)
Physical Therapy Treatment Patient Details Name: Terri Bender MRN: [id number] DOB: 26-Nov-1965 Today's Date: 05/15/2017    History of Present Illness Pt admitted following R MCA CVA.  PMH includes Htn, frequent HA, depression and anxiety.    PT Comments    Pt agreeable to PT; reports 6/10 HA pain. Received medication during session. Pt requires Mod A for bed mobility to/from edge of bed and Max assist for repositioning in bed. Pt requires sequencing cues throughout tasks. Poor sitting balance at edge of bed especially with lower extremity movement. Pt demonstrates no right reactions until cued and gives attempted/little assist. Pt demonstrates loss of balance posterior, anterior and lateral to the right. Little movement seen with attempted left march and FAQ; trace to 2- strength at best. Continue PT to progress awareness, strength, endurance, balance and safety to improve all functional mobility.   Follow Up Recommendations  SNF     Equipment Recommendations       Recommendations for Other Services       Precautions / Restrictions Precautions Precautions: Fall Restrictions Weight Bearing Restrictions: No    Mobility  Bed Mobility Overal bed mobility: Needs Assistance Bed Mobility: Supine to Sit;Sit to Supine     Supine to sit: Mod assist Sit to supine: Mod assist   General bed mobility comments: Cues throughout task for sequence to/from edge of bed. Cues given for pt assist to scoot up in bed using R body, but requires Max A  Transfers                 General transfer comment: Not tested  Ambulation/Gait                 Stairs            Wheelchair Mobility    Modified Rankin (Stroke Patients Only)       Balance Overall balance assessment: History of Falls;Needs assistance Sitting-balance support: Single extremity supported;Feet supported Sitting balance-Leahy Scale: Poor Sitting balance - Comments: Demonstrates LOB at any given time  forward/bkwd or right with seated activity without any attempt at righting unless cued.                                     Cognition Arousal/Alertness: Awake/alert Behavior During Therapy: WFL for tasks assessed/performed Overall Cognitive Status: Within Functional Limits for tasks assessed                                        Exercises General Exercises - Lower Extremity Quad Sets: Strengthening;Both;10 reps;Supine(0-trace on L ) Gluteal Sets: Strengthening;Both;10 reps;Supine(absent on L) Long Arc Quad: AROM;Right;10 reps;Seated(2 sets; AAROM L 2- strength) Hip ABduction/ADduction: AAROM;Left;10 reps;Supine Hip Flexion/Marching: AROM;Right;10 reps;Seated(2 sets; AAROM on L (2-)) Toe Raises: AROM;Right;20 reps;Seated(L PROM) Heel Raises: AROM;Right;20 reps;Seated(L PROM) Other Exercises Other Exercises: static sitting with upright posture; L arm supported with pillow     General Comments        Pertinent Vitals/Pain Pain Assessment: 0-10 Pain Score: 6  Pain Location: HA, front of head    Home Living                      Prior Function            PT Goals (current goals can now be found in the  care plan section) Progress towards PT goals: Progressing toward goals(slowly)    Frequency    7X/week      PT Plan Current plan remains appropriate    Co-evaluation              AM-PAC PT "6 Clicks" Daily Activity  Outcome Measure  Difficulty turning over in bed (including adjusting bedclothes, sheets and blankets)?: Unable Difficulty moving from lying on back to sitting on the side of the bed? : Unable Difficulty sitting down on and standing up from a chair with arms (e.g., wheelchair, bedside commode, etc,.)?: Unable Help needed moving to and from a bed to chair (including a wheelchair)?: Total Help needed walking in hospital room?: Total Help needed climbing 3-5 steps with a railing? : Total 6 Click Score: 6     End of Session   Activity Tolerance: Patient tolerated treatment well Patient left: in bed;with bed alarm set;with call bell/phone within reach   PT Visit Diagnosis: Hemiplegia and hemiparesis Hemiplegia - Right/Left: Left Hemiplegia - caused by: Cerebral infarction     Time: [id number] PT Time Calculation (min) (ACUTE ONLY): 30 min  Charges:  $Therapeutic Exercise: 8-22 mins $Therapeutic Activity: 8-22 mins                    G Codes:  Functional Assessment Tool Used: AM-PAC 6 Clicks Basic Mobility     Scot Dock, PTA 05/15/2017, 12:09 PM

## 2017-05-15 NOTE — Progress Notes (Signed)
[employer] - [place] at [facility]   PATIENT NAME: Terri Bender    MR#:  [id number]  DATE OF BIRTH:  1965-12-25  SUBJECTIVE:   No acute events overnight. Awaiting insurance approval for short-term rehabilitation disposition. Patient has some mild headache. No other complaints.  REVIEW OF SYSTEMS:    Review of Systems  Constitutional: Negative for chills and fever.  HENT: Negative for congestion and tinnitus.   Eyes: Negative for blurred vision and double vision.  Respiratory: Negative for cough, shortness of breath and wheezing.   Cardiovascular: Negative for chest pain, orthopnea and PND.  Gastrointestinal: Negative for abdominal pain, diarrhea, nausea and vomiting.  Genitourinary: Negative for dysuria and hematuria.  Neurological: Positive for focal weakness (Dense Left sided Hemi-paresis) and headaches. Negative for dizziness and sensory change.  All other systems reviewed and are negative.   Nutrition: Dysphagia III with thin liquids Tolerating Diet: Yes Tolerating PT: Eval noted.    DRUG ALLERGIES:   Allergies  Allergen Reactions  . Erythromycin Nausea And Vomiting  . Septra [Sulfamethoxazole-Trimethoprim] Hives    VITALS:  Blood pressure 112/77, pulse 82, temperature 98.3 F (36.8 C), temperature source Oral, resp. rate 18, height 5\' 2"  (1.575 m), weight 41.1 kg (90 lb 9.6 oz), SpO2 96 %.  PHYSICAL EXAMINATION:   Physical Exam  GENERAL:  52 y.o.-year-old patient lying in bed in no acute distress.  EYES: Pupils equal, round, reactive to light and accommodation. No scleral icterus. Extraocular muscles intact.  HEENT: Head atraumatic, normocephalic. Oropharynx and nasopharynx clear.  NECK:  Supple, no jugular venous distention. No thyroid enlargement, no tenderness.  LUNGS: Normal breath sounds bilaterally, no wheezing, rales, rhonchi. No use of accessory muscles of respiration.  CARDIOVASCULAR: S1, S2 normal. No murmurs, rubs, or gallops.   ABDOMEN: Soft, nontender, nondistended. Bowel sounds present. No organomegaly or mass.  EXTREMITIES: No cyanosis, clubbing or edema b/l.    NEUROLOGIC: Cranial nerves II through XII are intact. Dense Left sided hemi-paresis with neglect.  PSYCHIATRIC: The patient is alert and oriented x 3.  SKIN: No obvious rash, lesion, or ulcer.    LABORATORY PANEL:   CBC Recent Labs  Lab 05/13/17 0410  WBC 6.5  HGB 11.1*  HCT 32.1*  PLT 200   ------------------------------------------------------------------------------------------------------------------  Chemistries  Recent Labs  Lab 05/11/17 1816 05/13/17 0410  05/14/17 0440  NA 136 138  --   --   K 4.2 2.5*  --  3.4*  CL 97* 109  --   --   CO2 25 20*  --   --   GLUCOSE 117* 129*  --   --   BUN 17 11  --   --   CREATININE 0.63 0.59  --   --   CALCIUM 10.3 8.3*  --   --   MG  --   --    < > 2.0  AST 80*  --   --   --   ALT 30  --   --   --   ALKPHOS 74  --   --   --   BILITOT 1.2  --   --   --    < > = values in this interval not displayed.   ------------------------------------------------------------------------------------------------------------------  Cardiac Enzymes Recent Labs  Lab 05/12/17 1710  TROPONINI 0.12*   ------------------------------------------------------------------------------------------------------------------  RADIOLOGY:  Ct Head Wo Contrast  Result Date: 05/14/2017 CLINICAL DATA:  Acute right MCA territory infarct EXAM: CT HEAD WITHOUT CONTRAST TECHNIQUE: Contiguous axial images were  obtained from the base of the skull through the vertex without intravenous contrast. COMPARISON:  05/11/2017, 05/13/2017 FINDINGS: Brain: Diffuse hypoattenuation with sulcal effacement throughout the right cerebral hemisphere right MCA distribution involving the frontal, parietal, and temporal lobes as well as the right basal ganglia. There is minimal leftward midline shift measuring 4.4 mm, image 11. Basilar cisterns  remain patent. No cerebellar abnormality. No extra-axial fluid collection. Petechial hemorrhage within the basal ganglia better appreciated by MRI on 05/13/2017. Vascular: Previous hyperdense right middle cerebral artery is not as well visualized on today's exam. Skull: Normal. Negative for fracture or focal lesion. Sinuses/Orbits: No acute finding. Other: None. IMPRESSION: Evolving large subacute right MCA territory infarct with minimal leftward midline shift measuring 4 mm. Basal ganglia petechial hemorrhage better appreciated on yesterday's MRI. No large intracranial hemorrhage or extra-axial fluid collection by noncontrast CT. Electronically Signed   By: Judie PetitM.  Shick M.D.   On: 05/14/2017 10:27     ASSESSMENT AND PLAN:   52 year old female with past medical history of hypertension, anxiety/depression, tobacco abuse who presented to the hospital as she was found down by her family and noted to have an acute CVA.  1. Acute CVA-patient has a large right-sided MCA distribution stroke. She has significant dense left-sided hemiparesis with neglect. -patient's passed swallow eval and currently on a dysphagia 3 diet. Cont. ASA, Statin.  -MRI/MRA of the brain showing large right-sided MCA CVA. Patient has some mild midline shift and petechial hemorrhage in the basal ganglia. CT scan of the head repeated yesterday showing no worsening of the midline shift. -Appreciate neurology consult.   -Awaiting insurance approval for short-term rehabilitation disposition.  2. Urinary tract infection-based off the urinalysis on admission. No cultures were sent.  - Treated with IV Ceftriaxone and asymptomatic.   3. Essential hypertension-continue IV metoprolol as needed for now. BP stable.   4. Hypokalemia/Hypomagnesemia - improved w/ supplementation and will cont. To monitor.   5. History of alcohol abuse-continue thiamine, folate.  6. Tobacco abuse-continue nicotine patch.   All the records are reviewed and case  discussed with Care Management/Social Worker. Management plans discussed with the patient, family and they are in agreement.  CODE STATUS: Full code  DVT Prophylaxis: Lovenox  TOTAL TIME TAKING CARE OF THIS PATIENT: 30 minutes.   POSSIBLE D/C IN 1-2 DAYS, DEPENDING ON CLINICAL CONDITION.   Houston SirenSAINANI,VIVEK J M.D on 05/15/2017 at 2:13 PM  Between 7am to 6pm - Pager - [phone]  After 6pm go to [email or web address] - Social research officer, governmentpassword EPAS [facility]  [facility] [place] [facility]  Office  [phone]  CC: Primary care physician; Emi BelfastGessner, Deborah B, FNP

## 2017-05-16 LAB — CULTURE, BLOOD (ROUTINE X 2)
Culture: NO GROWTH
SPECIAL REQUESTS: ADEQUATE

## 2017-05-16 MED ORDER — ATORVASTATIN CALCIUM 80 MG PO TABS: 80.0000 mg | ORAL_TABLET | Freq: Every day | ORAL | Status: DC

## 2017-05-16 NOTE — Progress Notes (Signed)
IV taken out and tele monitor off. Discharge instructions given. Will continue to monitor patient.

## 2017-05-16 NOTE — Progress Notes (Signed)
Report called and given to Hildred Laserurtis Lynch,RN at [facility]. All questions answered. Update given to family member over the phone as well. EMS called. Will continue to monitor until then.

## 2017-05-16 NOTE — Plan of Care (Signed)
  Adequate for Discharge Education: Knowledge of disease or condition will improve 05/16/2017 1136 - Adequate for Discharge by Weldon PickingAlejo Calderon, Manuella GhaziBerenice, RN Knowledge of patient specific risk factors addressed and post discharge goals established will improve 05/16/2017 1136 - Adequate for Discharge by Erma HeritageAlejo Calderon, Nitya Cauthon, RN Education: Knowledge of General Education information will improve 05/16/2017 1136 - Adequate for Discharge by Erma HeritageAlejo Calderon, Evangeline Utley, RN Health Behavior/Discharge Planning: Ability to manage health-related needs will improve 05/16/2017 1136 - Adequate for Discharge by Erma HeritageAlejo Calderon, Tadan Shill, RN Clinical Measurements: Ability to maintain clinical measurements within normal limits will improve 05/16/2017 1136 - Adequate for Discharge by Weldon PickingAlejo Calderon, Manuella GhaziBerenice, RN Will remain free from infection 05/16/2017 1136 - Adequate for Discharge by Erma HeritageAlejo Calderon, Omran Keelin, RN Diagnostic test results will improve 05/16/2017 1136 - Adequate for Discharge by Erma HeritageAlejo Calderon, Nahuel Wilbert, RN Respiratory complications will improve 05/16/2017 1136 - Adequate for Discharge by Erma HeritageAlejo Calderon, Heyli Min, RN Cardiovascular complication will be avoided 05/16/2017 1136 - Adequate for Discharge by Erma HeritageAlejo Calderon, Mitsuru Dault, RN Activity: Risk for activity intolerance will decrease 05/16/2017 1136 - Adequate for Discharge by Erma HeritageAlejo Calderon, Delenn Ahn, RN Nutrition: Adequate nutrition will be maintained 05/16/2017 1136 - Adequate for Discharge by Erma HeritageAlejo Calderon, Zainah Steven, RN Coping: Level of anxiety will decrease 05/16/2017 1136 - Adequate for Discharge by Erma HeritageAlejo Calderon, Wm Fruchter, RN Elimination: Will not experience complications related to bowel motility 05/16/2017 1136 - Adequate for Discharge by Erma HeritageAlejo Calderon, Voncile Schwarz, RN Will not experience complications related to urinary retention 05/16/2017 1136 - Adequate for Discharge by Erma HeritageAlejo Calderon, Becket Wecker, RN

## 2017-05-16 NOTE — Clinical Social Work Note (Addendum)
CS[facility] and [facility] have [facility] insurance authorization for patient to transfer today.  CSW updated patient and physician, CSW to continue to follow patient's progress throughout discharge planning.  11:15 am [facility]  Patient and family agreeable to plans will transport via ems RN to call report [phone] [address].  Ervin KnackEric R. Natarsha Hurwitz, MSW, Theresia MajorsLCSWA [phone]  05/16/2017 10:17 AM

## 2017-05-16 NOTE — Progress Notes (Signed)
Patient transported stable via EMS.

## 2017-05-17 ENCOUNTER — Ambulatory Visit: Payer: 59 | Admitting: Family Medicine

## 2017-05-17 LAB — FACTOR 5 LEIDEN

## 2017-05-17 LAB — CULTURE, BLOOD (ROUTINE X 2)
CULTURE: NO GROWTH
SPECIAL REQUESTS: ADEQUATE

## 2017-05-20 LAB — PROTHROMBIN GENE MUTATION

## 2017-07-31 ENCOUNTER — Ambulatory Visit
Admission: RE | Admit: 2017-07-31 | Discharge: 2017-07-31 | Disposition: A | Discharge status: Home / Self Care | Payer: 59 | Source: Ambulatory Visit | Attending: Orthopedic Surgery | Admitting: Orthopedic Surgery

## 2017-07-31 ENCOUNTER — Other Ambulatory Visit: Payer: Self-pay | Admitting: Orthopedic Surgery

## 2017-07-31 DIAGNOSIS — S42392A Other fracture of shaft of left humerus, initial encounter for closed fracture: Secondary | ICD-10-CM | POA: Insufficient documentation

## 2017-07-31 DIAGNOSIS — X58XXXA Exposure to other specified factors, initial encounter: Secondary | ICD-10-CM | POA: Diagnosis not present

## 2017-07-31 DIAGNOSIS — Q740 Other congenital malformations of upper limb(s), including shoulder girdle: Secondary | ICD-10-CM | POA: Diagnosis not present

## 2017-07-31 DIAGNOSIS — M25512 Pain in left shoulder: Secondary | ICD-10-CM | POA: Insufficient documentation

## 2017-09-04 ENCOUNTER — Ambulatory Visit
Admission: RE | Admit: 2017-09-04 | Discharge: 2017-09-04 | Disposition: A | Discharge status: Skilled Nursing Facility | Payer: 59 | Source: Ambulatory Visit | Attending: Cardiology | Admitting: Cardiology

## 2017-09-04 ENCOUNTER — Encounter
Admission: RE | Disposition: A | Discharge status: Skilled Nursing Facility | Payer: Self-pay | Source: Ambulatory Visit | Attending: Cardiology

## 2017-09-04 ENCOUNTER — Encounter: Payer: Self-pay | Admitting: Cardiology

## 2017-09-04 DIAGNOSIS — Z8249 Family history of ischemic heart disease and other diseases of the circulatory system: Secondary | ICD-10-CM | POA: Diagnosis not present

## 2017-09-04 DIAGNOSIS — I10 Essential (primary) hypertension: Secondary | ICD-10-CM | POA: Insufficient documentation

## 2017-09-04 DIAGNOSIS — F17210 Nicotine dependence, cigarettes, uncomplicated: Secondary | ICD-10-CM | POA: Insufficient documentation

## 2017-09-04 DIAGNOSIS — I6389 Other cerebral infarction: Secondary | ICD-10-CM | POA: Insufficient documentation

## 2017-09-04 DIAGNOSIS — F329 Major depressive disorder, single episode, unspecified: Secondary | ICD-10-CM | POA: Diagnosis not present

## 2017-09-04 DIAGNOSIS — I639 Cerebral infarction, unspecified: Secondary | ICD-10-CM

## 2017-09-04 HISTORY — PX: LOOP RECORDER INSERTION: EP1214

## 2017-09-04 SURGERY — LOOP RECORDER INSERTION
Anesthesia: LOCAL

## 2017-09-04 MED ORDER — LIDOCAINE-EPINEPHRINE (PF) 1 %-1:200000 IJ SOLN
INTRAMUSCULAR | Status: AC
  Filled 2017-09-04: qty 30

## 2017-09-04 MED ORDER — LIDOCAINE-EPINEPHRINE (PF) 1 %-1:200000 IJ SOLN
INTRAMUSCULAR | Status: DC | PRN
  Administered 2017-09-04: 10 mL via INTRADERMAL

## 2017-09-04 SURGICAL SUPPLY — 2 items
LOOP REVEAL LINQSYS (Prosthesis & Implant Heart) ×2 IMPLANT
PACK LOOP INSERTION (CUSTOM PROCEDURE TRAY) ×2 IMPLANT

## 2017-09-04 NOTE — Discharge Instructions (Signed)
Implantable Loop Recorder Placement, Care After  Refer to this sheet in the next few weeks. These instructions provide you with information about caring for yourself after your procedure. Your health care provider may also give you more specific instructions. Your treatment has been planned according to current medical practices, but problems sometimes occur. Call your health care provider if you have any problems or questions after your procedure.  What can I expect after the procedure?  After the procedure, it is common to have:   Soreness or pain near the cut from surgery (incision).   Some swelling or bruising near the incision.    Follow these instructions at home:  Medicines   Take over-the-counter and prescription medicines only as told by your health care provider.   If you were prescribed an antibiotic medicine, take it as told by your health care provider. Do not stop taking the antibiotic even if you start to feel better.  Bathing   Do not take baths, swim, or use a hot tub until your health care provider approves. Ask your health care provider if you can take showers. You may only be allowed to take sponge baths for bathing.  Incision care   Follow instructions from your health care provider about how to take care of your incision. Make sure you:  ? Wash your hands with soap and water before you change your bandage (dressing). If soap and water are not available, use hand sanitizer.  ? Change your dressing as told by your health care provider.  ? Keep your dressing dry.  ? Leave stitches (sutures), skin glue, or adhesive strips in place. These skin closures may need to stay in place for 2 weeks or longer. If adhesive strip edges start to loosen and curl up, you may trim the loose edges. Do not remove adhesive strips completely unless your health care provider tells you to do that.   Check your incision area every day for signs of infection. Check for:  ? More redness, swelling, or pain.  ? Fluid  or blood.  ? Warmth.  ? Pus or a bad smell.  Driving   If you received a sedative, do not drive for 24 hours after the procedure.   If you did not receive a sedative, ask your health care provider when it is safe to drive.  Activity   Return to your normal activities as told by your health care provider. Ask your health care provider what activities are safe for you.   Until your health care provider says it is safe:  ? Do not lift anything that is heavier than 10 lb (4.5 kg).  ? Do not do activities that involve lifting your arms over your head.  General instructions     Follow instructions from your health care provider about how and when to use your implantable loop recorder.   Do not go through a metal detection gate, and do not let someone hold a metal detector over your chest. Show your ID card.   Do not have an MRI unless you check with your health care provider first.   Do not use any tobacco products, such as cigarettes, chewing tobacco, and e-cigarettes. Tobacco can delay healing. If you need help quitting, ask your health care provider.   Keep all follow-up visits as told by your health care provider. This is important.  Contact a health care provider if:   You have more redness, swelling, or pain around your incision.     You have more fluid or blood coming from your incision.   Your incision feels warm to the touch.   You have pus or a bad smell coming from your incision.   You have a fever.   You have pain that is not relieved by your pain medicine.   You have triggered your device because of fainting (syncope) or because of a heartbeat that feels like it is racing, slow, fluttering, or skipping (palpitations).  Get help right away if:   You have chest pain.   You have difficulty breathing.  This information is not intended to replace advice given to you by your health care provider. Make sure you discuss any questions you have with your health care provider.  Document Released:  03/28/2015 Document Revised: 09/22/2015 Document Reviewed: 01/19/2015  Elsevier Interactive Patient Education  2018 Elsevier Inc[employer]

## 2018-01-21 ENCOUNTER — Other Ambulatory Visit: Payer: Self-pay

## 2018-01-21 ENCOUNTER — Emergency Department
Admission: EM | Admit: 2018-01-21 | Discharge: 2018-01-21 | Disposition: A | Discharge status: Skilled Nursing Facility | Payer: 59 | Attending: Emergency Medicine | Admitting: Emergency Medicine

## 2018-01-21 DIAGNOSIS — R45851 Suicidal ideations: Secondary | ICD-10-CM | POA: Insufficient documentation

## 2018-01-21 DIAGNOSIS — I10 Essential (primary) hypertension: Secondary | ICD-10-CM | POA: Insufficient documentation

## 2018-01-21 DIAGNOSIS — Z7982 Long term (current) use of aspirin: Secondary | ICD-10-CM | POA: Insufficient documentation

## 2018-01-21 DIAGNOSIS — F4325 Adjustment disorder with mixed disturbance of emotions and conduct: Secondary | ICD-10-CM

## 2018-01-21 DIAGNOSIS — F4320 Adjustment disorder, unspecified: Secondary | ICD-10-CM

## 2018-01-21 DIAGNOSIS — F17200 Nicotine dependence, unspecified, uncomplicated: Secondary | ICD-10-CM | POA: Diagnosis not present

## 2018-01-21 DIAGNOSIS — F329 Major depressive disorder, single episode, unspecified: Secondary | ICD-10-CM | POA: Insufficient documentation

## 2018-01-21 DIAGNOSIS — I639 Cerebral infarction, unspecified: Secondary | ICD-10-CM | POA: Diagnosis present

## 2018-01-21 DIAGNOSIS — Z79899 Other long term (current) drug therapy: Secondary | ICD-10-CM | POA: Diagnosis not present

## 2018-01-21 HISTORY — DX: Cerebral infarction, unspecified: I63.9

## 2018-01-21 LAB — COMPREHENSIVE METABOLIC PANEL
ALBUMIN: 4.1 g/dL (ref 3.5–5.0)
ALK PHOS: 107 U/L (ref 38–126)
ALT: 30 U/L (ref 0–44)
AST: 24 U/L (ref 15–41)
Anion gap: 8 (ref 5–15)
BILIRUBIN TOTAL: 0.2 mg/dL — AB (ref 0.3–1.2)
BUN: 19 mg/dL (ref 6–20)
CALCIUM: 9.2 mg/dL (ref 8.9–10.3)
CO2: 28 mmol/L (ref 22–32)
CREATININE: 0.85 mg/dL (ref 0.44–1.00)
Chloride: 97 mmol/L — ABNORMAL LOW (ref 98–111)
GFR calc Af Amer: >60 mL/min (ref >60)
GFR calc non Af Amer: >60 mL/min (ref >60)
GLUCOSE: 102 mg/dL — AB (ref 70–99)
Potassium: 4.3 mmol/L (ref 3.5–5.1)
Sodium: 133 mmol/L — ABNORMAL LOW (ref 135–145)
Total Protein: 7.6 g/dL (ref 6.5–8.1)

## 2018-01-21 LAB — ETHANOL

## 2018-01-21 LAB — CBC
HEMATOCRIT: 36.9 % (ref 35.0–47.0)
Hemoglobin: 12.5 g/dL (ref 12.0–16.0)
MCH: 32.6 pg (ref 26.0–34.0)
MCHC: 33.9 g/dL (ref 32.0–36.0)
MCV: 96.1 fL (ref 80.0–100.0)
Platelets: 402 10*3/uL (ref 150–440)
RBC: 3.84 MIL/uL (ref 3.80–5.20)
RDW: 13.4 % (ref 11.5–14.5)
WBC: 13.2 10*3/uL — ABNORMAL HIGH (ref 3.6–11.0)

## 2018-01-21 LAB — SALICYLATE LEVEL: Salicylate Lvl: <7 mg/dL (ref 2.8–30.0)

## 2018-01-21 LAB — ACETAMINOPHEN LEVEL: Acetaminophen (Tylenol), Serum: <10 ug/mL — ABNORMAL LOW (ref 10–30)

## 2018-01-21 NOTE — ED Provider Notes (Signed)
[facility] Emergency Department Provider Note  ___________________________________________   First MD Initiated Contact with Patient 01/21/18 1458     (approximate)  I have reviewed the triage vital signs and the nursing notes.   HISTORY  Chief Complaint Suicidal   HPI Terri Bender is a 52 y.o. female with a history of depression, left-sided hemiparesis secondary to CVA who is presenting to the emergency department today complaining of suicidal ideation.  She says that she has vague suicidal thoughts without a plan that come and go.  Denies any suicidal ideation at this time.  Says that she is upset because of her power of attorney was recently taken away because she had been meeting men online and then asking them to come visit her at her residence as well as spending too much money online.  She denies any suicide attempt such as ingestion or other form of self-harm.  Past Medical History:  Diagnosis Date  . Anxiety   . Depression   . Frequent headaches   . History of chicken pox   . Hypertension   . Stroke [facility])     Patient Active Problem List   Diagnosis Date Noted  . Adjustment disorder with mixed disturbance of emotions and conduct 01/21/2018  . CVA (cerebral vascular accident) (HCC) 05/11/2017  . HTN (hypertension) 05/11/2017  . Smoker 05/08/2017    Past Surgical History:  Procedure Laterality Date  . CESAREAN SECTION    . FOOT SURGERY Left    bone spur  . LOOP RECORDER INSERTION N/A 09/04/2017   Procedure: LOOP RECORDER INSERTION;  Surgeon: Marcina Millard, MD;  Location: [facility] INVASIVE CV LAB;  Service: Cardiovascular;  Laterality: N/A;    Prior to Admission medications   Medication Sig Start Date End Date Taking? Authorizing Provider  aspirin EC 325 MG EC tablet Take 1 tablet (325 mg total) by mouth daily. 05/15/17   Houston Siren, MD  atorvastatin (LIPITOR) 80 MG tablet Take 1 tablet (80 mg total) by mouth daily at 6 PM.  05/16/17   Sainani, Rolly Pancake, MD  Cholecalciferol 5000 units capsule Take 1 capsule (5,000 Units total) by mouth daily. 03/20/17   Emi Belfast, FNP  clonazePAM (KLONOPIN) 0.5 MG tablet Take 1 tablet (0.5 mg total) by mouth 2 (two) times daily as needed for anxiety. 03/20/17   Emi Belfast, FNP  escitalopram (LEXAPRO) 10 MG tablet Take 1 tablet (10 mg total) by mouth daily. 03/20/17   Emi Belfast, FNP  feeding supplement, ENSURE ENLIVE, (ENSURE ENLIVE) LIQD Take 237 mLs by mouth 2 (two) times daily between meals. 05/14/17   Houston Siren, MD  levothyroxine (SYNTHROID, LEVOTHROID) 25 MCG tablet Take 25 mcg by mouth daily before breakfast.    [provider]  lisinopril (PRINIVIL,ZESTRIL) 10 MG tablet Take 1 tablet (10 mg total) by mouth daily. 04/05/17   Emi Belfast, FNP  potassium chloride (KLOR-CON) 20 MEQ packet Take 20 mEq by mouth 2 (two) times daily for 3 days. 05/14/17 05/17/17  Houston Siren, MD    Allergies Erythromycin and Septra [sulfamethoxazole-trimethoprim]  Family History  Problem Relation Age of Onset  . Heart disease Father   . Diabetes Paternal Grandmother   . Heart disease Paternal Grandfather     Social History Social History   Tobacco Use  . Smoking status: Current Every Day Smoker    Packs/day: 0.50  . Smokeless tobacco: Never Used  Substance Use Topics  . Alcohol use: Yes  Comment: occasional  . Drug use: No    Review of Systems  Constitutional: No fever/chills Eyes: No visual changes. ENT: No sore throat. Cardiovascular: Denies chest pain. Respiratory: Denies shortness of breath. Gastrointestinal: No abdominal pain.  No nausea, no vomiting.  No diarrhea.  No constipation. Genitourinary: Negative for dysuria. Musculoskeletal: Negative for back pain. Skin: Negative for rash. Neurological: Negative for headaches, focal weakness or numbness.   ____________________________________________   PHYSICAL  EXAM:  VITAL SIGNS: ED Triage Vitals  Enc Vitals Group     BP 01/21/18 1454 116/83     Pulse Rate 01/21/18 1454 82     Resp 01/21/18 1454 18     Temp 01/21/18 1454 (!) 97.4 F (36.3 C)     Temp Source 01/21/18 1454 Oral     SpO2 01/21/18 1454 98 %     Weight 01/21/18 1459 147 lb (66.7 kg)     Height 01/21/18 1459 5\' 2"  (1.575 m)     Head Circumference --      Peak Flow --      Pain Score 01/21/18 1458 4     Pain Loc --      Pain Edu? --      Excl. in GC? --     Constitutional: Alert and oriented. Well appearing and in no acute distress. Eyes: Conjunctivae are normal.  Head: Atraumatic. Nose: No congestion/rhinnorhea. Mouth/Throat: Mucous membranes are moist.  Neck: No stridor.   Cardiovascular: Normal rate, regular rhythm. Grossly normal heart sounds. Respiratory: Normal respiratory effort.  No retractions. Lungs CTAB. Gastrointestinal: Soft and nontender. No distention.  Musculoskeletal: No lower extremity tenderness nor edema.  No joint effusions. Neurologic:  Normal speech and language.  Left-sided hemiparesis with the patient says her baseline after the CVA. Skin:  Skin is warm, dry and intact. No rash noted. Psychiatric: Mood and affect are normal. Speech and behavior are normal.  ____________________________________________   LABS (all labs ordered are listed, but only abnormal results are displayed)  Labs Reviewed  COMPREHENSIVE METABOLIC PANEL - Abnormal; Notable for the following components:      Result Value   Sodium 133 (*)    Chloride 97 (*)    Glucose, Bld 102 (*)    Total Bilirubin 0.2 (*)    All other components within normal limits  ACETAMINOPHEN LEVEL - Abnormal; Notable for the following components:   Acetaminophen (Tylenol), Serum <10 (*)    All other components within normal limits  CBC - Abnormal; Notable for the following components:   WBC 13.2 (*)    All other components within normal limits  ETHANOL  SALICYLATE LEVEL  URINE DRUG SCREEN,  QUALITATIVE ([facility] ONLY)   ____________________________________________  EKG   ____________________________________________  RADIOLOGY   ____________________________________________   PROCEDURES  Procedure(s) performed:   Procedures  Critical Care performed:   ____________________________________________   INITIAL IMPRESSION / ASSESSMENT AND PLAN / ED COURSE  Pertinent labs & imaging results that were available during my care of the patient were reviewed by me and considered in my medical decision making (see chart for details).  DDX: Adjustment disorder, suicidal ideation, depression As part of my medical decision making, I reviewed the following data within the electronic MEDICAL RECORD NUMBER Notes from prior ED visits  ----------------------------------------- 4:46 PM on 01/21/2018 -----------------------------------------  Patient evaluated by Dr. Toni Amend who deems the patient appropriate for discharge at this time.  Patient, cooperative and is not expressed any active suicidal ideation throughout her stay.  Patient to be  discharged home. ____________________________________________   FINAL CLINICAL IMPRESSION(S) / ED DIAGNOSES  Suicidal thoughts.  Adjustment disorder.  NEW MEDICATIONS STARTED DURING THIS VISIT:  New Prescriptions   No medications on file     Note:  This document was prepared using Dragon voice recognition software and may include unintentional dictation errors.     Myrna BlazerSchaevitz, Taurus Willis Matthew, MD 01/21/18 [phone]

## 2018-01-21 NOTE — ED Notes (Signed)

## 2018-01-21 NOTE — ED Notes (Signed)
Pt taken to bathroom by wheelchair. Pt moved to wheelchair and toilet with minimal assistance.

## 2018-01-21 NOTE — ED Notes (Signed)
Called [facility] to let them know the psychiatrist has cleared her for discharge  - someone answered then hung up

## 2018-01-21 NOTE — ED Notes (Signed)
BEHAVIORAL HEALTH ROUNDING Patient sleeping: No. Patient alert and oriented: yes Behavior appropriate: Yes.  ; If no, describe:  Nutrition and fluids offered: yes Toileting and hygiene offered: Yes  Sitter present: q15 minute observations and security  monitoring Law enforcement present: Yes  ODS  

## 2018-01-21 NOTE — Consult Note (Signed)
[place] [facility] Consult   Reason for Consult: Consult for this 52 year old woman who was brought here from [facility] because of suicidal statement Referring Physician: Event organiser Patient Identification: Terri Bender MRN:  [id number] Principal Diagnosis: Adjustment disorder with mixed disturbance of emotions and conduct Diagnosis:   Patient Active Problem List   Diagnosis Date Noted  . Adjustment disorder with mixed disturbance of emotions and conduct [F43.25] 01/21/2018  . CVA (cerebral vascular accident) ([place]) [I63.9] 05/11/2017  . HTN (hypertension) [I10] 05/11/2017  . Smoker [F17.200] 05/08/2017    Total Time spent with patient: 1 hour  Subjective:   Terri Bender is a 52 y.o. female patient admitted with "I just got mad".  HPI: Patient interviewed chart reviewed.  52 year old woman with a history of a stroke who is residing at [facility].  She admits that in conversation with staff she said that she might as well save up all of her pills and then use them to overdose or alternatively stab herself with a pair of scissors.  Patient denies that she had any actual intent of doing that.  She says that she was feeling very angry and upset and has been feeling that way for the last couple days.  She had been hoping that she could go on a trip for a couple days away from [facility] with a man she met on the Internet but she was told by staff there that they considered her to be incapable of making her own decisions and would not let her leave.  Patient says she finds the whole situation very frustrating.  She feels like all of her dignity has been taken away from her since she had a stroke.  She denies that she had been particularly down or depressed.  Denies actually having any thoughts of killing herself denies any psychiatric symptoms.  Does admit that her mood is a little more up and down and weepy since having the stroke.  Denies however any full-blown mania or  psychotic symptoms.  Compliant with all of her current medicine.  Patient did not actually do anything to hurt her self.  Medical history: Apparently she had a stroke last January or thereabouts and since then has been residing at [facility].  She has left side weakness but is able to walk.  Still cannot use the left arm although there was a complication that she fractured it when she fell.  History of high blood pressure.  Denies other medical problems.  Substance abuse history: Patient says that she used to drink a lot.  Denies ever having had seizures or DTs.  Says that she has not had any alcohol since being at [facility] does not use any other drugs. Social history: Patient mentions her mother several times and believes that her mother is trying to control her.  It is very unclear to me whether the patient actually has a guardian or has been declared incompetent or not.  No paperwork to that effect was sent with her.  She mentions having children and says that she had been working before she had the stroke.   Past Psychiatric History: Past history of depression.  Denies any past suicide attempts.  Denies any previous hospitalization.  Remembers having taken clonazepam and Lexapro in the past.  Risk to Self:   Risk to Others:   Prior Inpatient Therapy:   Prior Outpatient Therapy:    Past Medical History:  Past Medical History:  Diagnosis Date  . Anxiety   . Depression   . Frequent headaches   . History of chicken pox   . Hypertension   . Stroke [facility])     Past Surgical History:  Procedure Laterality Date  . CESAREAN SECTION    . FOOT SURGERY Left    bone spur  . LOOP RECORDER INSERTION N/A 09/04/2017   Procedure: LOOP RECORDER INSERTION;  Surgeon: Isaias Cowman, MD;  Location: [place] CV LAB;  Service: Cardiovascular;  Laterality: N/A;   Family History:  Family History  Problem Relation Age of Onset  . Heart disease Father   . Diabetes Paternal Grandmother    . Heart disease Paternal Grandfather    Family Psychiatric  History: She says that several people in her family have had significant mental health problems including depression and possibly schizophrenia Patient mentions Social History   Substance and Sexual Activity  Alcohol Use Yes   Comment: occasional     Social History   Substance and Sexual Activity  Drug Use No    Social History   Socioeconomic History  . Marital status: Single    Spouse name: Not on file  . Number of children: Not on file  . Years of education: Not on file  . Highest education level: Not on file  Occupational History  . Not on file  Social Needs  . Financial resource strain: Not on file  . Food insecurity:    Worry: Not on file    Inability: Not on file  . Transportation needs:    Medical: Not on file    Non-medical: Not on file  Tobacco Use  . Smoking status: Current Every Day Smoker    Packs/day: 0.50  . Smokeless tobacco: Never Used  Substance and Sexual Activity  . Alcohol use: Yes    Comment: occasional  . Drug use: No  . Sexual activity: Yes    Birth control/protection: Post-menopausal  Lifestyle  . Physical activity:    Days per week: Not on file    Minutes per session: Not on file  . Stress: Not on file  Relationships  . Social connections:    Talks on phone: Not on file    Gets together: Not on file    Attends religious service: Not on file    Active member of club or organization: Not on file    Attends meetings of clubs or organizations: Not on file    Relationship status: Not on file  Other Topics Concern  . Not on file  Social History Narrative  . Not on file   Additional Social History:    Allergies:   Allergies  Allergen Reactions  . Erythromycin Nausea And Vomiting  . Septra [Sulfamethoxazole-Trimethoprim] Hives    Labs:  Results for orders placed or performed during the hospital encounter of 01/21/18 (from the past 48 hour(s))  Comprehensive  metabolic panel     Status: Abnormal   Collection Time: 01/21/18  3:23 PM  Result Value Ref Range   Sodium 133 (L) 135 - 145 mmol/L   Potassium 4.3 3.5 - 5.1 mmol/L   Chloride 97 (L) 98 - 111 mmol/L   CO2 28 22 - 32 mmol/L   Glucose, Bld 102 (H) 70 - 99 mg/dL   BUN 19 6 - 20 mg/dL   Creatinine, Ser 0.85 0.44 - 1.00 mg/dL   Calcium 9.2 8.9 - 10.3 mg/dL   Total Protein 7.6 6.5 - 8.1 g/dL   Albumin 4.1 3.5 -  5.0 g/dL   AST 24 15 - 41 U/L   ALT 30 0 - 44 U/L   Alkaline Phosphatase 107 38 - 126 U/L   Total Bilirubin 0.2 (L) 0.3 - 1.2 mg/dL   GFR calc non Af Amer >60 >60 mL/min   GFR calc Af Amer >60 >60 mL/min    Comment: (NOTE) The eGFR has been calculated using the CKD EPI equation. This calculation has not been validated in all clinical situations. eGFR's persistently <60 mL/min signify possible Chronic Kidney Disease.    Anion gap 8 5 - 15    Comment: Performed at [facility], [address] [place], [place] [address]  Ethanol     Status: None   Collection Time: 01/21/18  3:23 PM  Result Value Ref Range   Alcohol, Ethyl (B) <10 <10 mg/dL    Comment: (NOTE) Lowest detectable limit for serum alcohol is 10 mg/dL. For medical purposes only. Performed at [facility], [address] [place], [place] [address]   Salicylate level     Status: None   Collection Time: 01/21/18  3:23 PM  Result Value Ref Range   Salicylate Lvl <6.2 2.8 - 30.0 mg/dL    Comment: Performed at [facility], [address] [place], [place] [address]  Acetaminophen level     Status: Abnormal   Collection Time: 01/21/18  3:23 PM  Result Value Ref Range   Acetaminophen (Tylenol), Serum <10 (L) 10 - 30 ug/mL    Comment: (NOTE) Therapeutic concentrations vary significantly. A range of 10-30 ug/mL  may be an effective concentration for many patients. However, some  are best treated at concentrations outside of this range. Acetaminophen concentrations >150 ug/mL at 4 hours  after ingestion  and >50 ug/mL at 12 hours after ingestion are often associated with  toxic reactions. Performed at [facility], [address] [place], [place] [address]   cbc     Status: Abnormal   Collection Time: 01/21/18  3:23 PM  Result Value Ref Range   WBC 13.2 (H) 3.6 - 11.0 K/uL   RBC 3.84 3.80 - 5.20 MIL/uL   Hemoglobin 12.5 12.0 - 16.0 g/dL   HCT 36.9 35.0 - 47.0 %   MCV 96.1 80.0 - 100.0 fL   MCH 32.6 26.0 - 34.0 pg   MCHC 33.9 32.0 - 36.0 g/dL   RDW 13.4 11.5 - 14.5 %   Platelets 402 150 - 440 K/uL    Comment: Performed at [facility], [address] [place], [place] [address]    No current facility-administered medications for this encounter.    Current Outpatient Medications  Medication Sig Dispense Refill  . aspirin EC 325 MG EC tablet Take 1 tablet (325 mg total) by mouth daily. 30 tablet 0  . atorvastatin (LIPITOR) 80 MG tablet Take 1 tablet (80 mg total) by mouth daily at 6 PM.    . Cholecalciferol 5000 units capsule Take 1 capsule (5,000 Units total) by mouth daily. 30 capsule 5  . clonazePAM (KLONOPIN) 0.5 MG tablet Take 1 tablet (0.5 mg total) by mouth 2 (two) times daily as needed for anxiety. 30 tablet 1  . escitalopram (LEXAPRO) 10 MG tablet Take 1 tablet (10 mg total) by mouth daily. 30 tablet 2  . feeding supplement, ENSURE ENLIVE, (ENSURE ENLIVE) LIQD Take 237 mLs by mouth 2 (two) times daily between meals. 237 mL 12  . levothyroxine (SYNTHROID, LEVOTHROID) 25 MCG tablet Take 25 mcg by mouth  daily before breakfast.    . lisinopril (PRINIVIL,ZESTRIL) 10 MG tablet Take 1 tablet (10 mg total) by mouth daily. 90 tablet 3  . potassium chloride (KLOR-CON) 20 MEQ packet Take 20 mEq by mouth 2 (two) times daily for 3 days. 6 packet 0    Musculoskeletal: Strength & Muscle Tone: decreased Gait & Station: unsteady Patient leans: Left  Psychiatric Specialty Exam: Physical Exam  Nursing note and vitals reviewed. Constitutional: She  appears well-developed and well-nourished.  HENT:  Head: Normocephalic and atraumatic.  Eyes: Pupils are equal, round, and reactive to light. Conjunctivae are normal.  Neck: Normal range of motion.  Cardiovascular: Regular rhythm and normal heart sounds.  Respiratory: Effort normal. No respiratory distress.  GI: Soft.  Musculoskeletal: Normal range of motion.  Neurological: She is alert.  Skin: Skin is warm and dry.  Psychiatric: She has a normal mood and affect. Her speech is normal and behavior is normal. Judgment and thought content normal. Cognition and memory are normal.    Review of Systems  Constitutional: Negative.   HENT: Negative.   Eyes: Negative.   Respiratory: Negative.   Cardiovascular: Negative.   Gastrointestinal: Negative.   Musculoskeletal: Negative.   Skin: Negative.   Neurological: Positive for focal weakness.  Psychiatric/Behavioral: Negative.     Blood pressure 116/83, pulse 82, temperature (!) 97.4 F (36.3 C), temperature source Oral, resp. rate 18, height '5\' 2"'  (1.575 m), weight 66.7 kg, SpO2 98 %.Body mass index is 26.89 kg/m.  General Appearance: Fairly Groomed  Eye Contact:  Good  Speech:  Clear and Coherent  Volume:  Normal  Mood:  Anxious and Dysphoric  Affect:  Congruent  Thought Process:  Goal Directed  Orientation:  Full (Time, Place, and Person)  Thought Content:  Logical  Suicidal Thoughts:  No  Homicidal Thoughts:  No  Memory:  Immediate;   Fair Recent;   Fair Remote;   Fair  Judgement:  Fair  Insight:  Fair  Psychomotor Activity:  Normal  Concentration:  Concentration: Fair  Recall:  [employer] of Knowledge:  Fair  Language:  Fair  Akathisia:  No  Handed:  Right  AIMS (if indicated):     Assets:  Desire for Improvement Housing Physical Health Social Support  ADL's:  Intact  Cognition:  WNL  Sleep:        Treatment Plan Summary: Plan Patient with a history of a stroke.  Left-sided weakness.  He is aware of having a  little bit of memory impairment but otherwise is not clear if any other major central nervous system changes occurred.  Patient seems a little bit lucid in her conversation but not in any sort of bizarre or psychotic manner.  Really nothing that could just be normal.  In any case there is no evidence really that she is dangerous.  Absolutely denies suicidal ideation.  Able to articulate positive plans for the future and reasons not to harm herself.  No evidence of psychosis.  Case reviewed with emergency room doctor.  Patient does not require further hospital level care and can be discharged back to [facility] with treatment as usual.  Disposition: No evidence of imminent risk to self or others at present.   Patient does not meet criteria for psychiatric inpatient admission. Supportive therapy provided about ongoing stressors.  Alethia Berthold, MD 01/21/2018 4:17 PM

## 2018-01-21 NOTE — ED Triage Notes (Signed)
[facility]  Pt verbalizing suicidal ideation  She reported to the PA today that she had a plan to pocket all of her medications until she had enough to take to overdose or she has some scissors in her belongings and she planned to cut herself with the scissors   Staff verbalized to EMS that she was recently found incompetent and her mother is her POA  Pt talking with men on the internet and asking them to come to the facility to meet her  Called corporate office requesting lingerie and sex toys over the weekend

## 2018-01-21 NOTE — ED Notes (Signed)
[phone]  I spoke with debra - she transferred me  I spoke with administrator/ bed coordinator [facility]  Report provided

## 2018-01-21 NOTE — ED Notes (Signed)
Pt given supper tray.

## 2018-01-21 NOTE — ED Notes (Signed)
Call[facility]    [phone]

## 2018-01-27 ENCOUNTER — Encounter: Payer: Self-pay | Admitting: Family Medicine

## 2018-01-31 ENCOUNTER — Telehealth: Payer: Self-pay

## 2018-01-31 NOTE — Telephone Encounter (Signed)
Copied from CRM [id number]. Topic: General - Other >> Jan 31, 2018 11:25 AM Arlyss Gandy, NT wrote: Reason for CRM: Pts mom calling and states pt will not longer be coming to [facility]. She states her daughter had a stroke at the beginning of the year and is a pt now at [facility].

## 2018-01-31 NOTE — Telephone Encounter (Signed)
Noted. I have removed myself as PCP.  ?

## 2018-07-08 ENCOUNTER — Other Ambulatory Visit: Payer: Self-pay | Admitting: Orthopedic Surgery

## 2018-07-08 DIAGNOSIS — M25512 Pain in left shoulder: Secondary | ICD-10-CM

## 2018-07-22 ENCOUNTER — Ambulatory Visit: Payer: Medicaid Other

## 2018-08-20 ENCOUNTER — Other Ambulatory Visit: Payer: Self-pay

## 2018-08-20 ENCOUNTER — Ambulatory Visit
Admission: RE | Admit: 2018-08-20 | Discharge: 2018-08-20 | Disposition: A | Discharge status: Home / Self Care | Payer: Medicaid Other | Source: Ambulatory Visit | Attending: Orthopedic Surgery | Admitting: Orthopedic Surgery

## 2018-08-20 DIAGNOSIS — M25512 Pain in left shoulder: Secondary | ICD-10-CM | POA: Insufficient documentation

## 2018-08-20 LAB — POCT I-STAT CREATININE: Creatinine, Ser: 0.9 mg/dL (ref 0.44–1.00)

## 2018-08-20 MED ORDER — GADOBUTROL 1 MMOL/ML IV SOLN
6.0000 mL | Freq: Once | INTRAVENOUS | Status: AC | PRN
  Administered 2018-08-20: 13:00:00 6 mL via INTRAVENOUS

## 2018-08-21 ENCOUNTER — Other Ambulatory Visit: Payer: Self-pay | Admitting: Orthopedic Surgery

## 2018-08-22 ENCOUNTER — Other Ambulatory Visit: Payer: Self-pay | Admitting: Orthopedic Surgery

## 2018-08-22 DIAGNOSIS — M25512 Pain in left shoulder: Secondary | ICD-10-CM

## 2018-10-23 ENCOUNTER — Ambulatory Visit: Admission: RE | Admit: 2018-10-23 | Payer: Medicaid Other | Source: Ambulatory Visit

## 2018-12-09 IMAGING — CT CT HEAD W/O CM
3 series · 16 of 45 positions shown, 19 images · non-contrast
Comparison: 05/11/2017, 05/13/2017

CLINICAL DATA: Acute right MCA territory infarct

EXAM:
CT HEAD WITHOUT CONTRAST
TECHNIQUE: Contiguous axial images were obtained from the base of the skull
through the vertex without intravenous contrast.

[Series 2: head wo · axial · 0.44mm/px · z∈[+398,+513]mm · 10 of 28 slices shown, 13 images]
[im 3/28  brain]
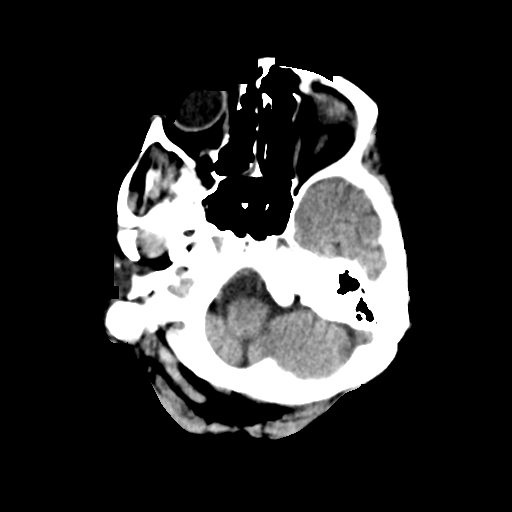
[im 3/28  bone]
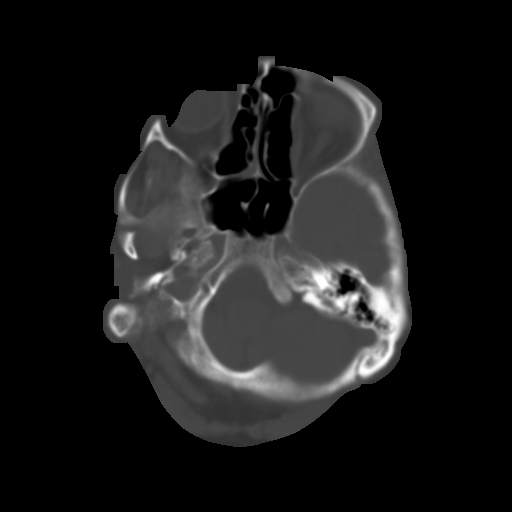
[im 5/28  brain]
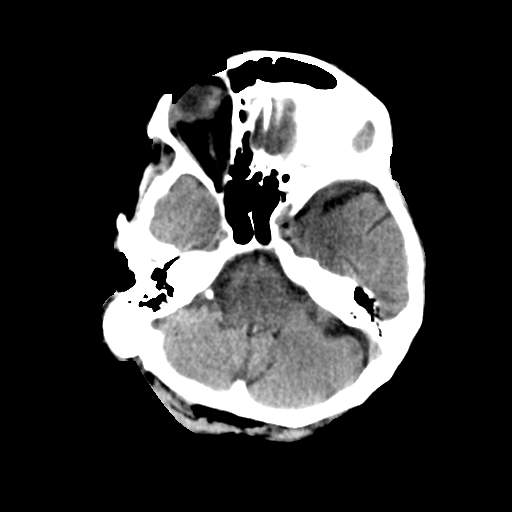
[im 8/28  brain]
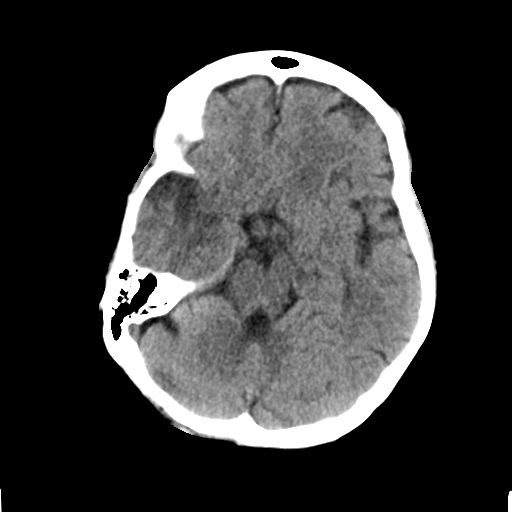
[im 11/28  brain]
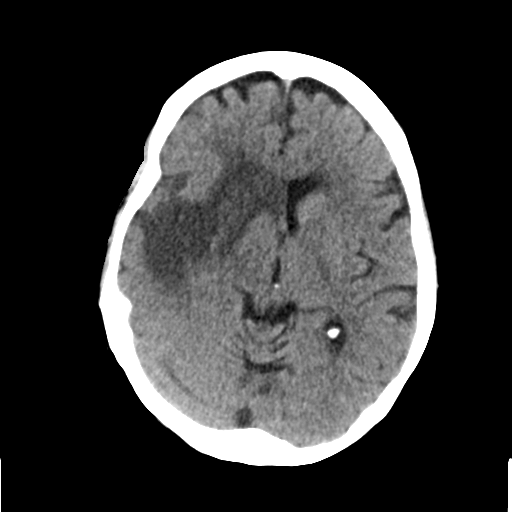
[im 13/28  brain]
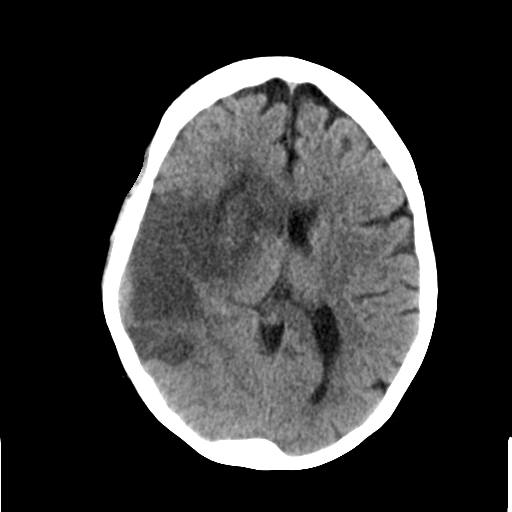
[im 13/28  bone]
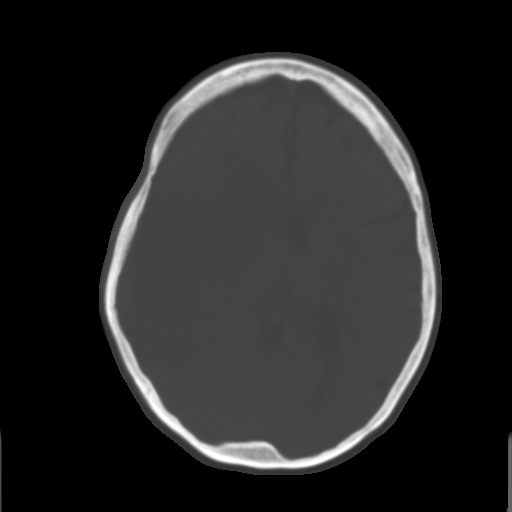
[im 16/28  brain]
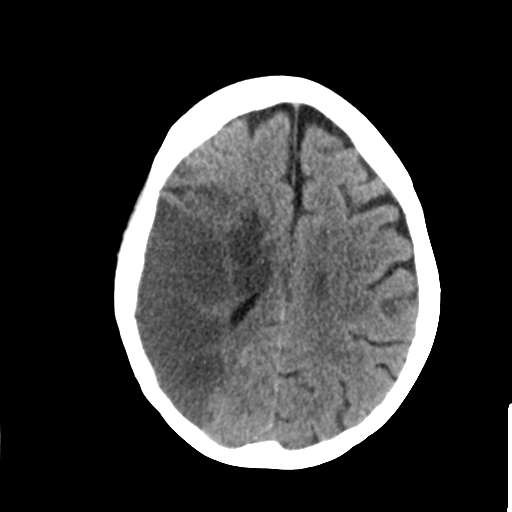
[im 18/28  brain]
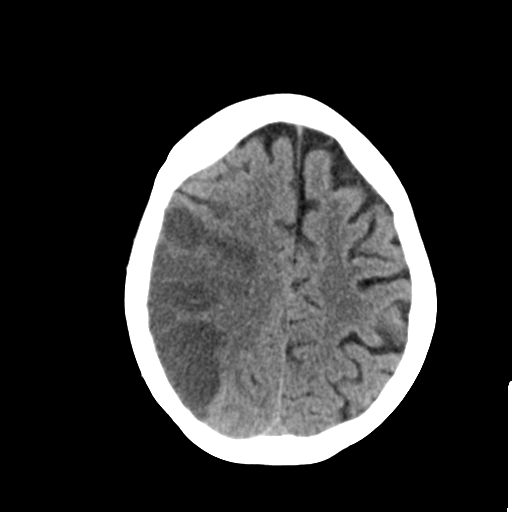
[im 21/28  brain]
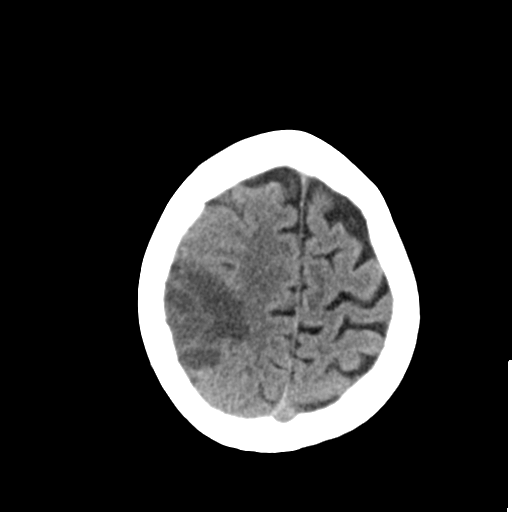
[im 24/28  brain]
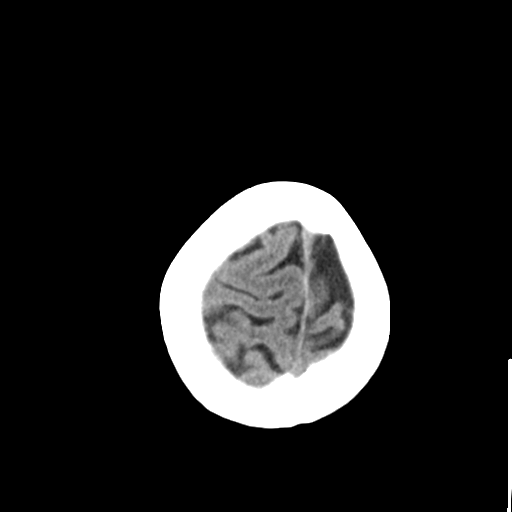
[im 24/28  bone]
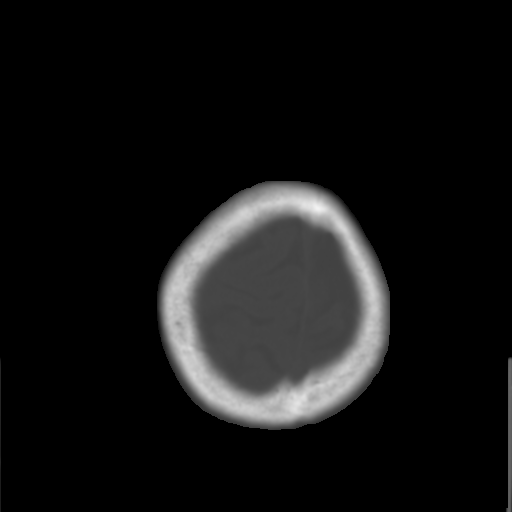
[im 26/28  brain]
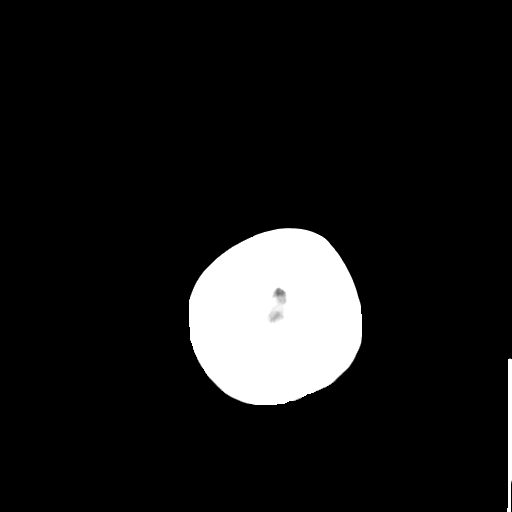

[Series 4: coronal soft tissue · coronal · 0.31mm/px · 3 of 64 slices shown]
[im 22/64  brain]
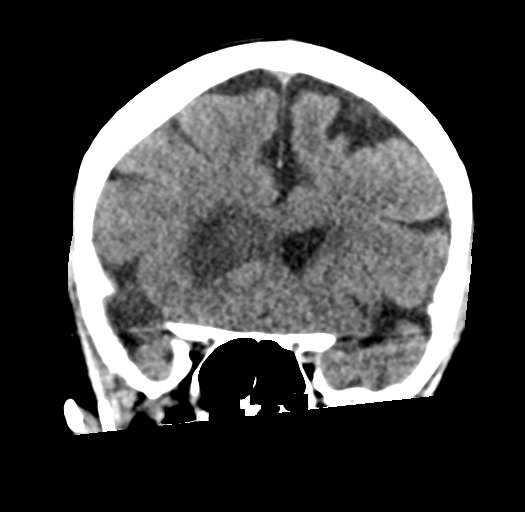
[im 29/64  brain]
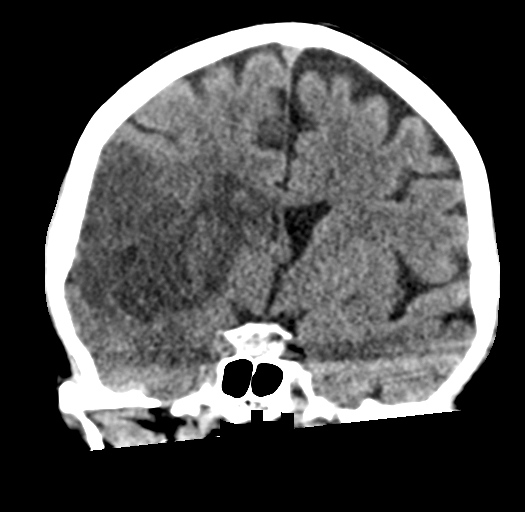
[im 36/64  brain]
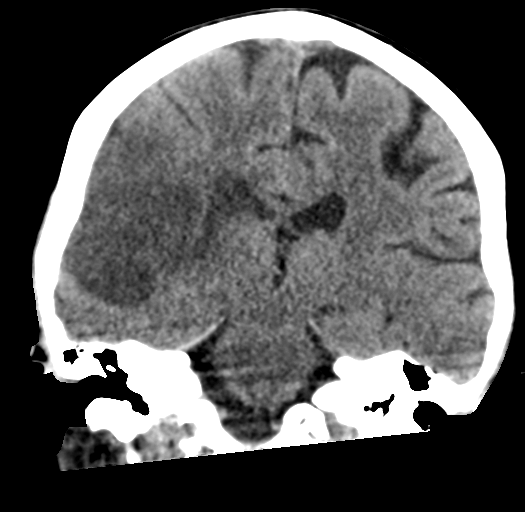

[Series 5: sagittal soft tissue · sagittal · 0.31mm/px · 3 of 54 slices shown]
[im 18/54  brain]
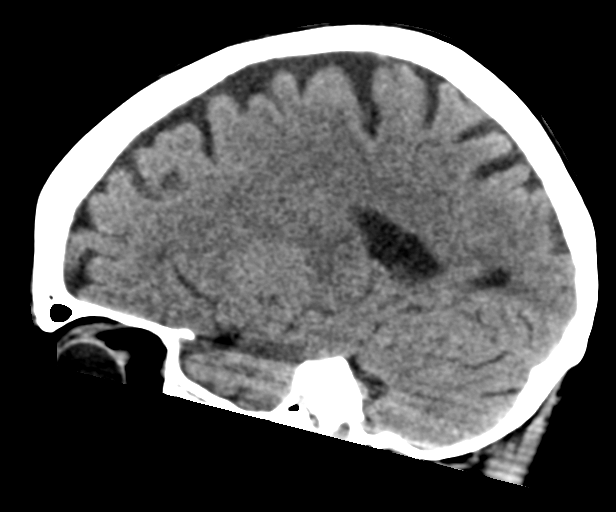
[im 27/54  brain]
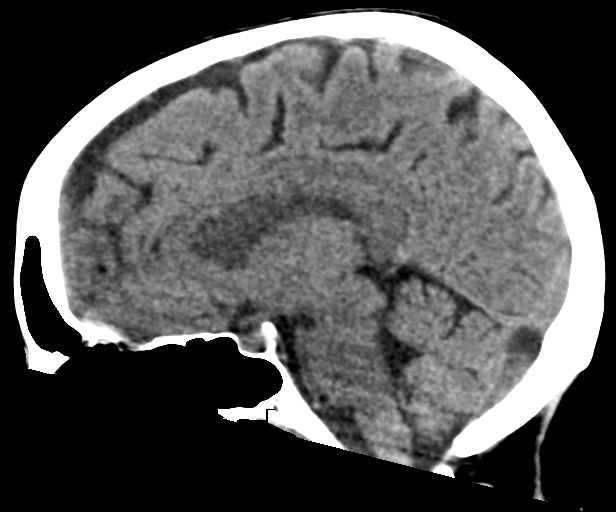
[im 36/54  brain]
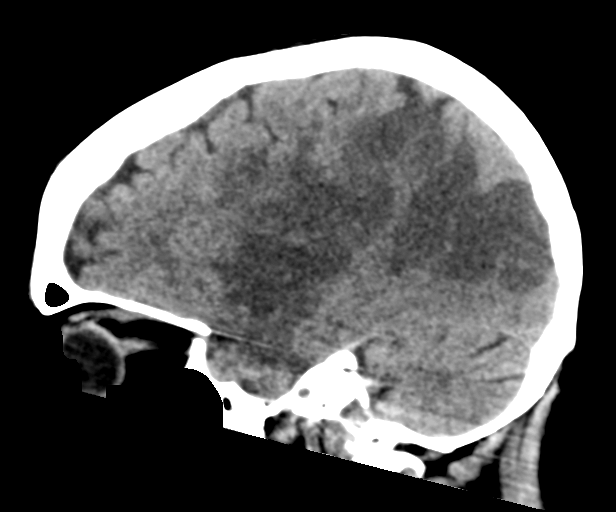

[16 of 45 positions shown; findings below may reference images not displayed]

FINDINGS: Brain: Diffuse hypoattenuation with sulcal effacement throughout the
right cerebral hemisphere right MCA distribution involving the
frontal, parietal, and temporal lobes as well as the right basal
ganglia. There is minimal leftward midline shift measuring 4.4 mm,
image 11. Basilar cisterns remain patent. No cerebellar abnormality.
No extra-axial fluid collection. Petechial hemorrhage within the
basal ganglia better appreciated by MRI on 05/13/2017.

Vascular: Previous hyperdense right middle cerebral artery is not as
well visualized on today's exam.

Skull: Normal. Negative for fracture or focal lesion.

Sinuses/Orbits: No acute finding.

Other: None.
IMPRESSION: Evolving large subacute right MCA territory infarct with minimal
leftward midline shift measuring 4 mm.

Basal ganglia petechial hemorrhage better appreciated on yesterday's
MRI. No large intracranial hemorrhage or extra-axial fluid
collection by noncontrast CT.

## 2019-06-23 ENCOUNTER — Ambulatory Visit: Payer: 59 | Admitting: Obstetrics and Gynecology

## 2019-07-07 ENCOUNTER — Other Ambulatory Visit (HOSPITAL_COMMUNITY)
Admission: RE | Admit: 2019-07-07 | Discharge: 2019-07-07 | Disposition: A | Discharge status: Home / Self Care | Payer: Medicaid Other | Source: Ambulatory Visit | Attending: Obstetrics and Gynecology | Admitting: Obstetrics and Gynecology

## 2019-07-07 ENCOUNTER — Other Ambulatory Visit: Payer: Self-pay

## 2019-07-07 ENCOUNTER — Encounter: Payer: Self-pay | Admitting: Obstetrics and Gynecology

## 2019-07-07 ENCOUNTER — Ambulatory Visit (INDEPENDENT_AMBULATORY_CARE_PROVIDER_SITE_OTHER): Payer: Medicaid Other | Admitting: Obstetrics and Gynecology

## 2019-07-07 VITALS — BP 120/78 | Temp 97.0°F | Ht 62.75 in | Wt 226.0 lb

## 2019-07-07 DIAGNOSIS — Z0000 Encounter for general adult medical examination without abnormal findings: Secondary | ICD-10-CM | POA: Diagnosis not present

## 2019-07-07 DIAGNOSIS — Z01419 Encounter for gynecological examination (general) (routine) without abnormal findings: Secondary | ICD-10-CM

## 2019-07-07 DIAGNOSIS — Z124 Encounter for screening for malignant neoplasm of cervix: Secondary | ICD-10-CM

## 2019-07-07 NOTE — Patient Instructions (Signed)
Institute of Medicine Recommended Dietary Allowances for Calcium and Vitamin D  Age (yr) Calcium Recommended Dietary Allowance (mg/day) Vitamin D Recommended Dietary Allowance (international units/day)  9-18 1,300 600  19-50 1,000 600  51-70 1,200 600  71 and older 1,200 800  Data from Institute of Medicine. Dietary reference intakes: calcium, vitamin D. [place], [place]: National Academies Press; 2011.    Budget-Friendly Healthy Eating There are many ways to save money at the grocery store and continue to eat healthy. You can be successful if you:  Plan meals according to your budget.  Make a grocery list and only purchase food according to your grocery list.  Prepare food yourself. What are tips for following this plan?  Reading food labels  Compare food labels between brand name foods and the store brand. Often the nutritional value is the same, but the store brand is lower cost.  Look for products that do not have added sugar, fat, or salt (sodium). These often cost the same but are healthier for you. Products may be labeled as: ? Sugar-free. ? Nonfat. ? Low-fat. ? Sodium-free. ? Low-sodium.  Look for lean ground beef labeled as at least 92% lean and 8% fat. Shopping  Buy only the items on your grocery list and go only to the areas of the store that have the items on your list.  Use coupons only for foods and brands you normally buy. Avoid buying items you wouldn't normally buy simply because they are on sale.  Check online and in newspapers for weekly deals.  Buy healthy items from the bulk bins when available, such as herbs, spices, flour, pasta, nuts, and dried fruit.  Buy fruits and vegetables that are in season. Prices are usually lower on in-season produce.  Look at the unit price on the price tag. Use it to compare different brands and sizes to find out which item is the best deal.  Choose healthy items that are often low-cost, such as carrots,  potatoes, apples, bananas, and oranges. Dried or canned beans are a low-cost protein source.  Buy in bulk and freeze extra food. Items you can buy in bulk include meats, fish, poultry, frozen fruits, and frozen vegetables.  Avoid buying "ready-to-eat" foods, such as pre-cut fruits and vegetables and pre-made salads.  If possible, shop around to discover where you can find the best prices. Consider other retailers such as dollar stores, larger wholesale stores, local fruit and vegetable stands, and farmers markets.  Do not shop when you are hungry. If you shop while hungry, it may be hard to stick to your list and budget.  Resist impulse buying. Use your grocery list as your official plan for the week.  Buy a variety of vegetables and fruits by purchasing fresh, frozen, and canned items.  Look at the top and bottom shelves for deals. Foods at eye level (eye level of an adult or child) are usually more expensive.  Be efficient with your time when shopping. The more time you spend at the store, the more money you are likely to spend.  To save money when choosing more expensive foods like meats and dairy: ? Choose cheaper cuts of meat, such as bone-in chicken thighs and drumsticks instead of skinless and boneless chicken. When you are ready to prepare the chicken, you can remove the skin yourself to make it healthier. ? Choose lean meats like chicken or turkey instead of beef. ? Choose canned seafood, such as tuna, salmon, or sardines. ? Buy eggs   as a low-cost source of protein. ? Buy dried beans and peas, such as lentils, split peas, or kidney beans instead of meats. Dried beans and peas are a good alternative source of protein. ? Buy the larger tubs of yogurt instead of individual-sized containers.  Choose water instead of sodas and other sweetened beverages.  Avoid buying chips, cookies, and other "junk food." These items are usually expensive and not healthy. Cooking  Make extra food  and freeze the extras in meal-sized containers or in individual portions for fast meals and snacks.  Pre-cook on days when you have extra time to prepare meals in advance. You can keep these meals in the fridge or freezer and reheat for a quick meal.  When you come home from the grocery store, wash, peel, and cut fruits and vegetables so they are ready to use and eat. This will help reduce food waste. Meal planning  Do not eat out or get fast food. Prepare food at home.  Make a grocery list and make sure to bring it with you to the store. If you have a smart phone, you could use your phone to create your shopping list.  Plan meals and snacks according to a grocery list and budget you create.  Use leftovers in your meal plan for the week.  Look for recipes where you can cook once and make enough food for two meals.  Include budget-friendly meals like stews, casseroles, and stir-fry dishes.  Try some meatless meals or try "no cook" meals like salads.  Make sure that half your plate is filled with fruits or vegetables. Choose from fresh, frozen, or canned fruits and vegetables. If eating canned, remember to rinse them before eating. This will remove any excess salt added for packaging. Summary  Eating healthy on a budget is possible if you plan your meals according to your budget, purchase according to your budget and grocery list, and prepare food yourself.  Tips for buying more food on a limited budget include buying generic brands, using coupons only for foods you normally buy, and buying healthy items from the bulk bins when available.  Tips for buying cheaper food to replace expensive food include choosing cheaper, lean cuts of meat, and buying dried beans and peas. This information is not intended to replace advice given to you by your health care provider. Make sure you discuss any questions you have with your health care provider. Document Revised: 04/17/2017 Document Reviewed:  04/17/2017 Elsevier Patient Education  2020 [employer].

## 2019-07-07 NOTE — Progress Notes (Signed)
Gynecology Annual Exam  PCP: Keane Police, MD  Chief Complaint:  Chief Complaint  Patient presents with  . Gynecologic Exam    Pasted recently that passed a alot of dark brown discharge    History of Present Illness:Patient is a 54 y.o. [id number] presents for annual exam. The patient has no complaints today.   Since she was last seen in 2019 she has suffered a stroke which has left her paralyzed on the left side.  She has significant mobility and range of motion limitations.  She is currently living in a skilled nursing facility.  Her stepmother currently has medical legal decision-making capacity.  In 2019 she had an ASCUS HPV positive Pap smear which led to a CIN-1 colposcopy.  follow-up was recommended in 1 year.  It has now been 2 years since that colposcopy.  She reports that she has a cardiac device in her left breast which prevents her from having mammograms.  LMP: No LMP recorded. Patient is postmenopausal. Reporting vaginal bleeding and dark brown discharge.   The patient is not sexually active. The patient does not perform self breast exams.  There is no notable family history of breast or ovarian cancer in her family.  The patient has regular exercise: no.    The patient reports current symptoms of depression.    PHQ-9: 12 GAD-2: 12   Review of Systems: Review of Systems  Constitutional: Negative for chills, fever, malaise/fatigue and weight loss.  HENT: Negative for congestion, hearing loss and sinus pain.   Eyes: Negative for blurred vision and double vision.  Respiratory: Negative for cough, sputum production, shortness of breath and wheezing.   Cardiovascular: Negative for chest pain, palpitations, orthopnea and leg swelling.  Gastrointestinal: Negative for abdominal pain, constipation, diarrhea, nausea and vomiting.  Genitourinary: Negative for dysuria, flank pain, frequency, hematuria and urgency.  Musculoskeletal: Negative for back pain, falls  and joint pain.  Skin: Negative for itching and rash.  Neurological: Negative for dizziness and headaches.  Psychiatric/Behavioral: Negative for depression, substance abuse and suicidal ideas. The patient is not nervous/anxious.     Past Medical History:  Patient Active Problem List   Diagnosis Date Noted  . Adjustment disorder with mixed disturbance of emotions and conduct 01/21/2018  . CVA (cerebral vascular accident) (HCC) 05/11/2017  . HTN (hypertension) 05/11/2017  . Smoker 05/08/2017    Past Surgical History:  Past Surgical History:  Procedure Laterality Date  . CESAREAN SECTION    . FOOT SURGERY Left    bone spur  . LOOP RECORDER INSERTION N/A 09/04/2017   Procedure: LOOP RECORDER INSERTION;  Surgeon: Marcina Millard, MD;  Location: [facility] INVASIVE CV LAB;  Service: Cardiovascular;  Laterality: N/A;    Gynecologic History:  No LMP recorded. Patient is postmenopausal. Last Pap: Results were: 2019 ASCUS with POSITIVE high risk HPV  Last mammogram: unknown   Obstetric History: G2P2002  Family History:  Family History  Problem Relation Age of Onset  . Heart disease Father   . Diabetes Paternal Grandmother   . Heart disease Paternal Grandfather     Social History:  Social History   Socioeconomic History  . Marital status: Single    Spouse name: Not on file  . Number of children: Not on file  . Years of education: Not on file  . Highest education level: Not on file  Occupational History  . Not on file  Tobacco Use  . Smoking status: Current Every Day Smoker    Packs/day:  0.50  . Smokeless tobacco: Never Used  Substance and Sexual Activity  . Alcohol use: Yes    Comment: occasional  . Drug use: No  . Sexual activity: Not Currently    Birth control/protection: Post-menopausal  Other Topics Concern  . Not on file  Social History Narrative  . Not on file   Social Determinants of Health   Financial Resource Strain:   . Difficulty of Paying Living  Expenses: Not on file  Food Insecurity:   . Worried About Programme researcher, broadcasting/film/video in the Last Year: Not on file  . Ran Out of Food in the Last Year: Not on file  Transportation Needs:   . Lack of Transportation (Medical): Not on file  . Lack of Transportation (Non-Medical): Not on file  Physical Activity:   . Days of Exercise per Week: Not on file  . Minutes of Exercise per Session: Not on file  Stress:   . Feeling of Stress : Not on file  Social Connections:   . Frequency of Communication with Friends and Family: Not on file  . Frequency of Social Gatherings with Friends and Family: Not on file  . Attends Religious Services: Not on file  . Active Member of Clubs or Organizations: Not on file  . Attends Banker Meetings: Not on file  . Marital Status: Not on file  Intimate Partner Violence:   . Fear of Current or Ex-Partner: Not on file  . Emotionally Abused: Not on file  . Physically Abused: Not on file  . Sexually Abused: Not on file    Allergies:  Allergies  Allergen Reactions  . Erythromycin Nausea And Vomiting  . Septra [Sulfamethoxazole-Trimethoprim] Hives  . Sulfamethoxazole Other (See Comments)  . Trimethoprim Other (See Comments)    Medications: Prior to Admission medications   Medication Sig Start Date End Date Taking? Authorizing Provider  acetaminophen (TYLENOL) 325 MG tablet Take by mouth.   Yes [provider]  aspirin EC 325 MG EC tablet Take 1 tablet (325 mg total) by mouth daily. 05/15/17  Yes Houston Siren, MD  atorvastatin (LIPITOR) 80 MG tablet Take 1 tablet (80 mg total) by mouth daily at 6 PM. 05/16/17  Yes Sainani, Rolly Pancake, MD  Cholecalciferol 5000 units capsule Take 1 capsule (5,000 Units total) by mouth daily. 03/20/17  Yes Emi Belfast, FNP  clonazePAM (KLONOPIN) 0.5 MG tablet Take 1 tablet (0.5 mg total) by mouth 2 (two) times daily as needed for anxiety. 03/20/17  Yes Emi Belfast, FNP  cyanocobalamin (,VITAMIN  B-12,) 1000 MCG/ML injection  02/19/18  Yes [provider]  escitalopram (LEXAPRO) 10 MG tablet Take 1 tablet (10 mg total) by mouth daily. 03/20/17  Yes Emi Belfast, FNP  feeding supplement, ENSURE ENLIVE, (ENSURE ENLIVE) LIQD Take 237 mLs by mouth 2 (two) times daily between meals. 05/14/17  Yes Sainani, Rolly Pancake, MD  Fremanezumab-vfrm (AJOVY) 225 MG/1.5ML SOAJ Inject into the skin. 06/22/19  Yes [provider]  guaifenesin (ROBITUSSIN) 100 MG/5ML syrup Take by mouth.   Yes [provider]  Lactobacillus (ACIDOPHILUS) CAPS capsule Take by mouth.   Yes [provider]  levothyroxine (SYNTHROID, LEVOTHROID) 25 MCG tablet Take 25 mcg by mouth daily before breakfast.   Yes [provider]  lisinopril (PRINIVIL,ZESTRIL) 10 MG tablet Take 1 tablet (10 mg total) by mouth daily. 04/05/17  Yes Emi Belfast, FNP  Magnesium (V-R MAGNESIUM) 250 MG TABS Take by mouth.   Yes [provider]  Melatonin 3 MG TABS Take by mouth.   Yes [provider]  nortriptyline (PAMELOR) 50 MG capsule Take by mouth. 11/06/18  Yes [provider]  oxycodone (OXY-IR) 5 MG capsule Take by mouth.   Yes [provider]  pantoprazole (PROTONIX) 20 MG tablet  06/20/17  Yes [provider]  pregabalin (LYRICA) 75 MG capsule Take by mouth. 06/23/19  Yes [provider]  senna (SENOKOT) 8.6 MG tablet Take by mouth.   Yes [provider]  sodium chloride 1 g tablet Take by mouth.   Yes [provider]  thiamine 100 MG tablet Take by mouth.   Yes [provider]  torsemide (DEMADEX) 10 MG tablet Take by mouth.   Yes [provider]  traZODone (DESYREL) 50 MG tablet Take by mouth. 02/20/18  Yes [provider]  triamcinolone cream (KENALOG) 0.1 % Apply topically.   Yes [provider]  venlafaxine (EFFEXOR) 75 MG tablet Take by mouth. 11/06/18  Yes [provider]  Wheat  Dextrin (BENEFIBER) POWD Take by mouth. 06/20/17  Yes [provider]  potassium chloride (KLOR-CON) 20 MEQ packet Take 20 mEq by mouth 2 (two) times daily for 3 days. 05/14/17 05/17/17  Henreitta Leber, MD    Physical Exam Vitals: Blood pressure 120/78, temperature (!) 97 F (36.1 C), height 5' 2.75" (1.594 m), weight 226 lb (102.5 kg).  General: NAD HEENT: normocephalic, anicteric Thyroid: no enlargement, no palpable nodules Pulmonary: No increased work of breathing, CTAB Cardiovascular: RRR, distal pulses 2+ Breast: Breast symmetrical, no tenderness, no palpable nodules or masses, no skin or nipple retraction present, no nipple discharge.  No axillary or supraclavicular lymphadenopathy. Abdomen: NABS, soft, non-tender, non-distended.  Umbilicus without lesions.  No hepatomegaly, splenomegaly or masses palpable. No evidence of hernia  Genitourinary:  External: Normal external female genitalia.  Normal urethral meatus, normal Bartholin's and Skene's glands.    Vagina: Normal vaginal mucosa, no evidence of prolapse.    Cervix: Difficult to visualize on limited exam  Uterus: Deferred  Adnexa: Deferred  Rectal: deferred  Lymphatic: no evidence of inguinal lymphadenopathy Extremities: no edema, erythema, or tenderness Neurologic: Grossly intact Psychiatric: mood appropriate, affect full  Female chaperone present for pelvic and breast  portions of the physical exam     Assessment: 54 y.o. G2P2002 routine annual exam  Plan: Problem List Items Addressed This Visit    None    Visit Diagnoses    Cervical cancer screening    -  Primary   Relevant Orders   Cytology - PAP   Normal female breast exam          1) Mammogram - recommend yearly screening mammogram.  Mammogram can not be performed because of cardiac implant  2) STI screening  was not offered and therefore not obtained  3) ASCCP guidelines and rational discussed.  Patient opts for yearly screening interval.    4) Routine healthcare maintenance including cholesterol, diabetes screening discussed managed by PCP  6) Colonoscopy needed, may be challenging to obtain given limited mobility. Will discuss with patient at next visit.   7) Patient has very limited mobility because of her history of stroke.  Pelvic exam is nearly impossible in the office.  Spoke with the patient regarding options for yearly exams under anesthesia for monitoring of cervical dysplasia.  These can be scheduled in the future.  8) Patient reports postmenopausal bleeding and discharge.  Given the difficulty of performing an exam pelvic  ultrasound will not be possible in the office.  We will plan for hysteroscopy D&C.  Patient does have a history of previous uterine polyps.  9)  Return in about 1 year (around 07/06/2020) for annual.  Adelene Idler MD [facility], [facility] 07/07/2019 4:43 PM

## 2019-07-09 LAB — CYTOLOGY - PAP
Comment: NEGATIVE
Diagnosis: NEGATIVE
High risk HPV: NEGATIVE

## 2020-02-28 ENCOUNTER — Other Ambulatory Visit: Payer: Self-pay

## 2020-02-28 ENCOUNTER — Emergency Department: Payer: Medicare Other

## 2020-02-28 DIAGNOSIS — I10 Essential (primary) hypertension: Secondary | ICD-10-CM | POA: Insufficient documentation

## 2020-02-28 DIAGNOSIS — Z79899 Other long term (current) drug therapy: Secondary | ICD-10-CM | POA: Insufficient documentation

## 2020-02-28 DIAGNOSIS — R29810 Facial weakness: Secondary | ICD-10-CM | POA: Diagnosis not present

## 2020-02-28 DIAGNOSIS — Z7982 Long term (current) use of aspirin: Secondary | ICD-10-CM | POA: Insufficient documentation

## 2020-02-28 DIAGNOSIS — F17200 Nicotine dependence, unspecified, uncomplicated: Secondary | ICD-10-CM | POA: Insufficient documentation

## 2020-02-28 LAB — DIFFERENTIAL
Abs Immature Granulocytes: 0.04 10*3/uL (ref 0.00–0.07)
Basophils Absolute: 0 10*3/uL (ref 0.0–0.1)
Basophils Relative: 0 %
Eosinophils Absolute: 0.2 10*3/uL (ref 0.0–0.5)
Eosinophils Relative: 2 %
Immature Granulocytes: 0 %
Lymphocytes Relative: 37 %
Lymphs Abs: 4.9 10*3/uL — ABNORMAL HIGH (ref 0.7–4.0)
Monocytes Absolute: 1.4 10*3/uL — ABNORMAL HIGH (ref 0.1–1.0)
Monocytes Relative: 11 %
Neutro Abs: 6.6 10*3/uL (ref 1.7–7.7)
Neutrophils Relative %: 50 %

## 2020-02-28 LAB — COMPREHENSIVE METABOLIC PANEL
ALT: 38 U/L (ref 0–44)
AST: 31 U/L (ref 15–41)
Albumin: 3.7 g/dL (ref 3.5–5.0)
Alkaline Phosphatase: 102 U/L (ref 38–126)
Anion gap: 13 (ref 5–15)
BUN: 12 mg/dL (ref 6–20)
CO2: 31 mmol/L (ref 22–32)
Calcium: 9.2 mg/dL (ref 8.9–10.3)
Chloride: 97 mmol/L — ABNORMAL LOW (ref 98–111)
Creatinine, Ser: 0.72 mg/dL (ref 0.44–1.00)
GFR, Estimated: >60 mL/min (ref >60)
Glucose, Bld: 132 mg/dL — ABNORMAL HIGH (ref 70–99)
Potassium: 2.7 mmol/L — CL (ref 3.5–5.1)
Sodium: 141 mmol/L (ref 135–145)
Total Bilirubin: 0.7 mg/dL (ref 0.3–1.2)
Total Protein: 7.2 g/dL (ref 6.5–8.1)

## 2020-02-28 LAB — APTT: aPTT: 28 seconds (ref 24–36)

## 2020-02-28 LAB — CBC
HCT: 42.7 % (ref 36.0–46.0)
Hemoglobin: 14.6 g/dL (ref 12.0–15.0)
MCH: 32.8 pg (ref 26.0–34.0)
MCHC: 34.2 g/dL (ref 30.0–36.0)
MCV: 96 fL (ref 80.0–100.0)
Platelets: 391 10*3/uL (ref 150–400)
RBC: 4.45 MIL/uL (ref 3.87–5.11)
RDW: 15.2 % (ref 11.5–15.5)
WBC: 13.2 10*3/uL — ABNORMAL HIGH (ref 4.0–10.5)
nRBC: 0 % (ref 0.0–0.2)

## 2020-02-28 LAB — PROTIME-INR
INR: 1 (ref 0.8–1.2)
Prothrombin Time: 12 seconds (ref 11.4–15.2)

## 2020-02-28 NOTE — ED Triage Notes (Signed)
Pt from [facility] with left sided facial droop. Pt with previous history of cva that has left her left side flacid but with left side sensation intact. Pt with clear speech. Sensation intact as normal to left arm and leg, pt with inability to close left eye, not able to raise left eyebrow, and left sided mouth drooping. Pt denies pain, states it began "sometime after eight this morning" but more than 4-8 hours ago.

## 2020-02-29 ENCOUNTER — Emergency Department
Admission: EM | Admit: 2020-02-29 | Discharge: 2020-02-29 | Disposition: A | Discharge status: Other Acute Inpatient Hospital | Payer: Medicare Other | Attending: Emergency Medicine | Admitting: Emergency Medicine

## 2020-02-29 DIAGNOSIS — R29810 Facial weakness: Secondary | ICD-10-CM | POA: Diagnosis not present

## 2020-02-29 DIAGNOSIS — G510 Bell's palsy: Secondary | ICD-10-CM

## 2020-02-29 MED ORDER — VALACYCLOVIR HCL 500 MG PO TABS
1000.0000 mg | ORAL_TABLET | Freq: Once | ORAL | Status: AC
  Administered 2020-02-29: 1000 mg via ORAL
  Filled 2020-02-29: qty 2

## 2020-02-29 MED ORDER — VALACYCLOVIR HCL 1 G PO TABS: 1000.0000 mg | ORAL_TABLET | Freq: Three times a day (TID) | ORAL | 0 refills | Status: AC

## 2020-02-29 MED ORDER — PREDNISONE 20 MG PO TABS: 60.0000 mg | ORAL_TABLET | Freq: Every day | ORAL | 0 refills | Status: AC

## 2020-02-29 MED ORDER — PREDNISONE 20 MG PO TABS
60.0000 mg | ORAL_TABLET | Freq: Once | ORAL | Status: AC
  Administered 2020-02-29: 60 mg via ORAL
  Filled 2020-02-29: qty 3

## 2020-02-29 NOTE — ED Provider Notes (Signed)
[facility] Emergency Department Provider Note   ____________________________________________   First MD Initiated Contact with Patient 02/29/20 0010     (approximate)  I have reviewed the triage vital signs and the nursing notes.   HISTORY  Chief Complaint left side facial droop    HPI Terri Bender is a 54 y.o. female with a stated past medical history of anxiety/depression, migraines, and right MCA stroke that left her left upper and lower extremities hemiplegic presents for left-sided facial droop and dysarthria that began sometime after 8 AM the day of arrival.  Patient states that her dysarthria has improved but she is unable to close her left eye, wrinkle the left side of her forehead, or smile with the left side of her face.  Patient states that she has never had symptoms similar to this in the past.  Patient does endorse a recent upper respiratory tract infection approximately 2-3 weeks prior to arrival.         Past Medical History:  Diagnosis Date  . Anxiety   . Depression   . Frequent headaches   . History of chicken pox   . Hypertension   . Stroke [facility])     Patient Active Problem List   Diagnosis Date Noted  . Adjustment disorder with mixed disturbance of emotions and conduct 01/21/2018  . CVA (cerebral vascular accident) (HCC) 05/11/2017  . HTN (hypertension) 05/11/2017  . Smoker 05/08/2017    Past Surgical History:  Procedure Laterality Date  . CESAREAN SECTION    . FOOT SURGERY Left    bone spur  . LOOP RECORDER INSERTION N/A 09/04/2017   Procedure: LOOP RECORDER INSERTION;  Surgeon: Marcina Millard, MD;  Location: [facility] INVASIVE CV LAB;  Service: Cardiovascular;  Laterality: N/A;    Prior to Admission medications   Medication Sig Start Date End Date Taking? Authorizing Provider  acetaminophen (TYLENOL) 325 MG tablet Take by mouth.    [provider]  aspirin EC 325 MG EC tablet Take 1 tablet (325 mg total) by  mouth daily. 05/15/17   Houston Siren, MD  atorvastatin (LIPITOR) 80 MG tablet Take 1 tablet (80 mg total) by mouth daily at 6 PM. 05/16/17   Sainani, Rolly Pancake, MD  Cholecalciferol 5000 units capsule Take 1 capsule (5,000 Units total) by mouth daily. 03/20/17   Emi Belfast, FNP  clonazePAM (KLONOPIN) 0.5 MG tablet Take 1 tablet (0.5 mg total) by mouth 2 (two) times daily as needed for anxiety. 03/20/17   Emi Belfast, FNP  cyanocobalamin (,VITAMIN B-12,) 1000 MCG/ML injection  02/19/18   [provider]  escitalopram (LEXAPRO) 10 MG tablet Take 1 tablet (10 mg total) by mouth daily. 03/20/17   Emi Belfast, FNP  feeding supplement, ENSURE ENLIVE, (ENSURE ENLIVE) LIQD Take 237 mLs by mouth 2 (two) times daily between meals. 05/14/17   Houston Siren, MD  Fremanezumab-vfrm (AJOVY) 225 MG/1.5ML SOAJ Inject into the skin. 06/22/19   [provider]  guaifenesin (ROBITUSSIN) 100 MG/5ML syrup Take by mouth.    [provider]  Lactobacillus (ACIDOPHILUS) CAPS capsule Take by mouth.    [provider]  levothyroxine (SYNTHROID, LEVOTHROID) 25 MCG tablet Take 25 mcg by mouth daily before breakfast.    [provider]  lisinopril (PRINIVIL,ZESTRIL) 10 MG tablet Take 1 tablet (10 mg total) by mouth daily. 04/05/17   Emi Belfast, FNP  Magnesium (V-R MAGNESIUM) 250 MG TABS Take by mouth.    [provider]  Melatonin 3 MG TABS Take by mouth.    [provider]  nortriptyline (PAMELOR) 50 MG capsule Take by mouth. 11/06/18   [provider]  oxycodone (OXY-IR) 5 MG capsule Take by mouth.    [provider]  pantoprazole (PROTONIX) 20 MG tablet  06/20/17   [provider]  potassium chloride (KLOR-CON) 20 MEQ packet Take 20 mEq by mouth 2 (two) times daily for 3 days. 05/14/17 05/17/17  Houston Siren, MD  pregabalin (LYRICA) 75 MG capsule Take by mouth. 06/23/19   [provider]  senna  (SENOKOT) 8.6 MG tablet Take by mouth.    [provider]  sodium chloride 1 g tablet Take by mouth.    [provider]  thiamine 100 MG tablet Take by mouth.    [provider]  torsemide (DEMADEX) 10 MG tablet Take by mouth.    [provider]  traZODone (DESYREL) 50 MG tablet Take by mouth. 02/20/18   [provider]  triamcinolone cream (KENALOG) 0.1 % Apply topically.    [provider]  venlafaxine (EFFEXOR) 75 MG tablet Take by mouth. 11/06/18   [provider]  Wheat Dextrin (BENEFIBER) POWD Take by mouth. 06/20/17   [provider]    Allergies Erythromycin, Septra [sulfamethoxazole-trimethoprim], Sulfamethoxazole, and Trimethoprim  Family History  Problem Relation Age of Onset  . Heart disease Father   . Diabetes Paternal Grandmother   . Heart disease Paternal Grandfather     Social History Social History   Tobacco Use  . Smoking status: Current Every Day Smoker    Packs/day: 0.50  . Smokeless tobacco: Never Used  Vaping Use  . Vaping Use: Every day  Substance Use Topics  . Alcohol use: Yes    Comment: occasional  . Drug use: No    Review of Systems Constitutional: No fever/chills Eyes: No visual changes. ENT: No sore throat. Cardiovascular: Denies chest pain. Respiratory: Denies shortness of breath. Gastrointestinal: No abdominal pain.  No nausea, no vomiting.  No diarrhea. Genitourinary: Negative for dysuria. Musculoskeletal: Negative for acute arthralgias Skin: Negative for rash. Neurological: Positive for left facial droop and dysarthria.  Negative for headaches, weakness/numbness/paresthesias in any extremity Psychiatric: Negative for suicidal ideation/homicidal ideation   ____________________________________________   PHYSICAL EXAM:  VITAL SIGNS: ED Triage Vitals  Enc Vitals Group     BP 02/28/20 2142 (!) 108/44     Pulse Rate 02/28/20 2142 89     Resp 02/28/20 2142 18      Temp 02/28/20 2142 98.5 F (36.9 C)     Temp Source 02/28/20 2142 Oral     SpO2 02/28/20 2142 93 %     Weight 02/28/20 2143 216 lb (98 kg)     Height 02/28/20 2143 5' (1.524 m)     Head Circumference --      Peak Flow --      Pain Score 02/28/20 2142 7     Pain Loc --      Pain Edu? --      Excl. in GC? --    Constitutional: Alert and oriented. Well appearing and in no acute distress. Eyes: Conjunctivae are normal. PERRL. Head: Atraumatic. Nose: No congestion/rhinnorhea. Mouth/Throat: Mucous membranes are moist. Neck: No stridor Cardiovascular: Grossly normal heart sounds.  Good peripheral circulation. Respiratory: Normal respiratory effort.  No retractions. Gastrointestinal: Soft and nontender. No distention. Musculoskeletal: No obvious deformities Neurologic: Slurred speech, normal language.  Hemiplegia of left upper and lower  extremity.  Left-sided facial paralysis that does not spare the forehead Skin:  Skin is warm and dry. No rash noted. Psychiatric: Mood and affect are normal. Speech and behavior are normal.  ____________________________________________   LABS (all labs ordered are listed, but only abnormal results are displayed)  Labs Reviewed  CBC - Abnormal; Notable for the following components:      Result Value   WBC 13.2 (*)    All other components within normal limits  DIFFERENTIAL - Abnormal; Notable for the following components:   Lymphs Abs 4.9 (*)    Monocytes Absolute 1.4 (*)    All other components within normal limits  COMPREHENSIVE METABOLIC PANEL - Abnormal; Notable for the following components:   Potassium 2.7 (*)    Chloride 97 (*)    Glucose, Bld 132 (*)    All other components within normal limits  PROTIME-INR  APTT   ____________________________________________  EKG  ED ECG REPORT I, Merwyn Katos, the attending physician, personally viewed and interpreted this ECG.  Date: 02/28/2020 EKG Time: 2139 Rate: 90 Rhythm: normal sinus  rhythm QRS Axis: normal Intervals: normal ST/T Wave abnormalities: normal Narrative Interpretation: no evidence of acute ischemia  ____________________________________________  RADIOLOGY  ED MD interpretation: CT noncontrast of the head shows old right MCA infarct with encephalomalacia without evidence of acute intracerebral hemorrhage or acute fractures  Official radiology report(s): CT HEAD WO CONTRAST  Result Date: 02/28/2020 CLINICAL DATA:  Cerebral hemorrhage suspected.  Left facial droop. EXAM: CT HEAD WITHOUT CONTRAST TECHNIQUE: Contiguous axial images were obtained from the base of the skull through the vertex without intravenous contrast. COMPARISON:  05/14/2017 FINDINGS: Brain: Area of encephalomalacia within the right temporal and parietal lobes in area of prior infarct. No acute intracranial abnormality. Specifically, no hemorrhage, hydrocephalus, mass lesion, acute infarction, or significant intracranial injury. Vascular: No hyperdense vessel or unexpected calcification. Skull: No acute calvarial abnormality. Sinuses/Orbits: Visualized paranasal sinuses and mastoids clear. Orbital soft tissues unremarkable. Other: None IMPRESSION: Old right MCA infarct with encephalomalacia. No acute intracranial abnormality. Electronically Signed   By: Charlett Nose M.D.   On: 02/28/2020 22:10    ____________________________________________   PROCEDURES  Procedure(s) performed (including Critical Care):  Procedures   ____________________________________________   INITIAL IMPRESSION / ASSESSMENT AND PLAN / ED COURSE  As part of my medical decision making, I reviewed the following data within the electronic MEDICAL RECORD NUMBER Nursing notes reviewed and incorporated, Labs reviewed, EKG interpreted, Old chart reviewed, Radiograph reviewed and Notes from prior ED visits reviewed and incorporated        + Acute, painless, unilateral facial paralysis w no forehead sparing ML 2/2 Bell's  Palsy.  Unlikely CVA, trigeminal neuralgia, Botulism, MG, ICH.  Rx: - Artificial tears qhr while patient is awake AND ophthalmic ointment at night to prevent corneal exposure keratitis (consider protective goggles or taping) - Since within 72hours of onset will Rx Prednisone 60mg  qday x1wk. - Valacyclovir 1000mg  TID x1wk  Disposition: SRP and PCP follow up within the next week discussed. Advised if with incomplete facial recovery 3 months after initial symptom onset may need referral to ophthalmology +/- facial nerve specialist.      ____________________________________________   FINAL CLINICAL IMPRESSION(S) / ED DIAGNOSES  Final diagnoses:  Facial paralysis on left side  Left-sided Bell's palsy     ED Discharge Orders    None       Note:  This document was prepared using Dragon voice recognition software and may include unintentional  dictation errors.   Merwyn KatosBradler, Imaad Reuss K, MD 02/29/20 [phone]

## 2020-06-22 ENCOUNTER — Encounter: Payer: Self-pay | Admitting: Emergency Medicine

## 2020-06-22 ENCOUNTER — Other Ambulatory Visit: Payer: Self-pay

## 2020-06-22 ENCOUNTER — Observation Stay
Admission: EM | Admit: 2020-06-22 | Discharge: 2020-06-23 | Disposition: A | Discharge status: Home / Self Care | Payer: Medicare Other | Attending: Hospitalist | Admitting: Hospitalist

## 2020-06-22 ENCOUNTER — Emergency Department: Payer: Medicare Other

## 2020-06-22 DIAGNOSIS — Z6841 Body Mass Index (BMI) 40.0 and over, adult: Secondary | ICD-10-CM | POA: Insufficient documentation

## 2020-06-22 DIAGNOSIS — I69322 Dysarthria following cerebral infarction: Secondary | ICD-10-CM

## 2020-06-22 DIAGNOSIS — F32A Depression, unspecified: Secondary | ICD-10-CM

## 2020-06-22 DIAGNOSIS — I10 Essential (primary) hypertension: Secondary | ICD-10-CM | POA: Diagnosis not present

## 2020-06-22 DIAGNOSIS — I69359 Hemiplegia and hemiparesis following cerebral infarction affecting unspecified side: Secondary | ICD-10-CM

## 2020-06-22 DIAGNOSIS — F17210 Nicotine dependence, cigarettes, uncomplicated: Secondary | ICD-10-CM | POA: Insufficient documentation

## 2020-06-22 DIAGNOSIS — F419 Anxiety disorder, unspecified: Secondary | ICD-10-CM | POA: Insufficient documentation

## 2020-06-22 DIAGNOSIS — E876 Hypokalemia: Secondary | ICD-10-CM

## 2020-06-22 DIAGNOSIS — E66813 Obesity, class 3: Secondary | ICD-10-CM

## 2020-06-22 DIAGNOSIS — Z79899 Other long term (current) drug therapy: Secondary | ICD-10-CM | POA: Diagnosis not present

## 2020-06-22 DIAGNOSIS — Z7982 Long term (current) use of aspirin: Secondary | ICD-10-CM | POA: Insufficient documentation

## 2020-06-22 DIAGNOSIS — Z882 Allergy status to sulfonamides status: Secondary | ICD-10-CM | POA: Diagnosis not present

## 2020-06-22 DIAGNOSIS — R471 Dysarthria and anarthria: Secondary | ICD-10-CM | POA: Diagnosis not present

## 2020-06-22 DIAGNOSIS — K760 Fatty (change of) liver, not elsewhere classified: Secondary | ICD-10-CM | POA: Diagnosis not present

## 2020-06-22 DIAGNOSIS — R531 Weakness: Secondary | ICD-10-CM | POA: Diagnosis present

## 2020-06-22 DIAGNOSIS — R7989 Other specified abnormal findings of blood chemistry: Secondary | ICD-10-CM | POA: Diagnosis not present

## 2020-06-22 DIAGNOSIS — R410 Disorientation, unspecified: Secondary | ICD-10-CM

## 2020-06-22 DIAGNOSIS — Z8616 Personal history of COVID-19: Secondary | ICD-10-CM

## 2020-06-22 DIAGNOSIS — U071 COVID-19: Secondary | ICD-10-CM | POA: Diagnosis not present

## 2020-06-22 DIAGNOSIS — I69354 Hemiplegia and hemiparesis following cerebral infarction affecting left non-dominant side: Secondary | ICD-10-CM | POA: Diagnosis not present

## 2020-06-22 DIAGNOSIS — Z7901 Long term (current) use of anticoagulants: Secondary | ICD-10-CM | POA: Diagnosis not present

## 2020-06-22 DIAGNOSIS — Z881 Allergy status to other antibiotic agents status: Secondary | ICD-10-CM | POA: Insufficient documentation

## 2020-06-22 DIAGNOSIS — G9341 Metabolic encephalopathy: Secondary | ICD-10-CM | POA: Diagnosis not present

## 2020-06-22 LAB — CBC WITH DIFFERENTIAL/PLATELET
Abs Immature Granulocytes: 0.03 10*3/uL (ref 0.00–0.07)
Basophils Absolute: 0 10*3/uL (ref 0.0–0.1)
Basophils Relative: 0 %
Eosinophils Absolute: 0 10*3/uL (ref 0.0–0.5)
Eosinophils Relative: 0 %
HCT: 45.2 % (ref 36.0–46.0)
Hemoglobin: 15.5 g/dL — ABNORMAL HIGH (ref 12.0–15.0)
Immature Granulocytes: 0 %
Lymphocytes Relative: 21 %
Lymphs Abs: 1.6 10*3/uL (ref 0.7–4.0)
MCH: 32.2 pg (ref 26.0–34.0)
MCHC: 34.3 g/dL (ref 30.0–36.0)
MCV: 93.8 fL (ref 80.0–100.0)
Monocytes Absolute: 0.2 10*3/uL (ref 0.1–1.0)
Monocytes Relative: 3 %
Neutro Abs: 5.8 10*3/uL (ref 1.7–7.7)
Neutrophils Relative %: 76 %
Platelets: 416 10*3/uL — ABNORMAL HIGH (ref 150–400)
RBC: 4.82 MIL/uL (ref 3.87–5.11)
RDW: 13.5 % (ref 11.5–15.5)
WBC: 7.6 10*3/uL (ref 4.0–10.5)
nRBC: 0 % (ref 0.0–0.2)

## 2020-06-22 LAB — COMPREHENSIVE METABOLIC PANEL
ALT: 98 U/L — ABNORMAL HIGH (ref 0–44)
AST: 121 U/L — ABNORMAL HIGH (ref 15–41)
Albumin: 3.3 g/dL — ABNORMAL LOW (ref 3.5–5.0)
Alkaline Phosphatase: 123 U/L (ref 38–126)
Anion gap: 15 (ref 5–15)
BUN: 18 mg/dL (ref 6–20)
CO2: 32 mmol/L (ref 22–32)
Calcium: 9.3 mg/dL (ref 8.9–10.3)
Chloride: 85 mmol/L — ABNORMAL LOW (ref 98–111)
Creatinine, Ser: 0.87 mg/dL (ref 0.44–1.00)
GFR, Estimated: >60 mL/min (ref >60)
Glucose, Bld: 151 mg/dL — ABNORMAL HIGH (ref 70–99)
Potassium: 2.5 mmol/L — CL (ref 3.5–5.1)
Sodium: 132 mmol/L — ABNORMAL LOW (ref 135–145)
Total Bilirubin: 1.4 mg/dL — ABNORMAL HIGH (ref 0.3–1.2)
Total Protein: 7.5 g/dL (ref 6.5–8.1)

## 2020-06-22 LAB — TROPONIN I (HIGH SENSITIVITY)
Troponin I (High Sensitivity): 7 ng/L (ref <18)
Troponin I (High Sensitivity): 6 ng/L (ref <18)

## 2020-06-22 LAB — MAGNESIUM: Magnesium: 2.5 mg/dL — ABNORMAL HIGH (ref 1.7–2.4)

## 2020-06-22 LAB — BRAIN NATRIURETIC PEPTIDE: B Natriuretic Peptide: 31.3 pg/mL (ref 0.0–100.0)

## 2020-06-22 MED ORDER — ENOXAPARIN SODIUM 40 MG/0.4ML ~~LOC~~ SOLN: 40.0000 mg | SUBCUTANEOUS | Status: DC

## 2020-06-22 MED ORDER — ONDANSETRON HCL 4 MG/2ML IJ SOLN: 4.0000 mg | Freq: Four times a day (QID) | INTRAMUSCULAR | Status: DC | PRN

## 2020-06-22 MED ORDER — ENOXAPARIN SODIUM 60 MG/0.6ML ~~LOC~~ SOLN
0.5000 mg/kg | SUBCUTANEOUS | Status: DC
  Administered 2020-06-23: 50 mg via SUBCUTANEOUS
  Filled 2020-06-22: qty 0.6

## 2020-06-22 MED ORDER — ONDANSETRON HCL 4 MG PO TABS: 4.0000 mg | ORAL_TABLET | Freq: Four times a day (QID) | ORAL | Status: DC | PRN

## 2020-06-22 MED ORDER — POTASSIUM CHLORIDE 10 MEQ/100ML IV SOLN
10.0000 meq | INTRAVENOUS | Status: AC
  Administered 2020-06-22 (×2): 10 meq via INTRAVENOUS
  Filled 2020-06-22 (×2): qty 100

## 2020-06-22 MED ORDER — HYDROCODONE-ACETAMINOPHEN 5-325 MG PO TABS: 1.0000 | ORAL_TABLET | ORAL | Status: DC | PRN

## 2020-06-22 MED ORDER — ACETAMINOPHEN 650 MG RE SUPP: 650.0000 mg | Freq: Four times a day (QID) | RECTAL | Status: DC | PRN

## 2020-06-22 MED ORDER — POTASSIUM CHLORIDE CRYS ER 20 MEQ PO TBCR
40.0000 meq | EXTENDED_RELEASE_TABLET | Freq: Once | ORAL | Status: AC
  Administered 2020-06-22: 40 meq via ORAL
  Filled 2020-06-22: qty 2

## 2020-06-22 MED ORDER — ACETAMINOPHEN 325 MG PO TABS: 650.0000 mg | ORAL_TABLET | Freq: Four times a day (QID) | ORAL | Status: DC | PRN

## 2020-06-22 NOTE — ED Triage Notes (Signed)
EMS VS 111/62, 88% RA, places in 3L, now 96%, RR 14, HR 88, FSBS 166. EMS reports called out for hypokalemia and AMS. Pt able to state name, location and birth date. EMS did not receive K+ numbers. Pt also c/o left ankle pain. Pt with left sided deficits from previous CVA

## 2020-06-22 NOTE — ED Provider Notes (Signed)
[facility] Emergency Department Provider Note  ____________________________________________   Event Date/Time   First MD Initiated Contact with Patient 06/22/20 2029     (approximate)  I have reviewed the triage vital signs and the nursing notes.   HISTORY  Chief Complaint Altered Mental Status and hypokalemia    HPI Terri Bender is a 55 y.o. female with past medical history of hypertension, stroke, here with generalized weakness.  History is somewhat limited due to confusion.  Per report from the facility and family, the patient has been increasingly confused and less responsive throughout the day today.  She also had lab work done and was hypokalemic.  She has been sleeping more than usual.  Had a hard time waking her up.  She does note that she has had decreased appetite recently.  No known fevers.  No cough.  No sputum production.  No abdominal pain.  Denies any overt urinary symptoms.  No other complaints.  She has chronic weakness and numbness of the left side that is not acutely worsened.        Past Medical History:  Diagnosis Date  . Anxiety   . Depression   . Frequent headaches   . History of chicken pox   . Hypertension   . Stroke [facility])     Patient Active Problem List   Diagnosis Date Noted  . Adjustment disorder with mixed disturbance of emotions and conduct 01/21/2018  . CVA (cerebral vascular accident) (HCC) 05/11/2017  . HTN (hypertension) 05/11/2017  . Smoker 05/08/2017    Past Surgical History:  Procedure Laterality Date  . CESAREAN SECTION    . FOOT SURGERY Left    bone spur  . LOOP RECORDER INSERTION N/A 09/04/2017   Procedure: LOOP RECORDER INSERTION;  Surgeon: Marcina Millard, MD;  Location: [facility] INVASIVE CV LAB;  Service: Cardiovascular;  Laterality: N/A;    Prior to Admission medications   Medication Sig Start Date End Date Taking? Authorizing Provider  acetaminophen (TYLENOL) 325 MG tablet Take by mouth.     [provider]  aspirin EC 325 MG EC tablet Take 1 tablet (325 mg total) by mouth daily. 05/15/17   Houston Siren, MD  atorvastatin (LIPITOR) 80 MG tablet Take 1 tablet (80 mg total) by mouth daily at 6 PM. 05/16/17   Sainani, Rolly Pancake, MD  Cholecalciferol 5000 units capsule Take 1 capsule (5,000 Units total) by mouth daily. 03/20/17   Emi Belfast, FNP  clonazePAM (KLONOPIN) 0.5 MG tablet Take 1 tablet (0.5 mg total) by mouth 2 (two) times daily as needed for anxiety. 03/20/17   Emi Belfast, FNP  cyanocobalamin (,VITAMIN B-12,) 1000 MCG/ML injection  02/19/18   [provider]  escitalopram (LEXAPRO) 10 MG tablet Take 1 tablet (10 mg total) by mouth daily. 03/20/17   Emi Belfast, FNP  feeding supplement, ENSURE ENLIVE, (ENSURE ENLIVE) LIQD Take 237 mLs by mouth 2 (two) times daily between meals. 05/14/17   Houston Siren, MD  Fremanezumab-vfrm (AJOVY) 225 MG/1.5ML SOAJ Inject into the skin. 06/22/19   [provider]  guaifenesin (ROBITUSSIN) 100 MG/5ML syrup Take by mouth.    [provider]  Lactobacillus (ACIDOPHILUS) CAPS capsule Take by mouth.    [provider]  levothyroxine (SYNTHROID, LEVOTHROID) 25 MCG tablet Take 25 mcg by mouth daily before breakfast.    [provider]  lisinopril (PRINIVIL,ZESTRIL) 10 MG tablet Take 1 tablet (10 mg total) by mouth daily. 04/05/17  Emi Belfast, FNP  Magnesium (V-R MAGNESIUM) 250 MG TABS Take by mouth.    [provider]  Melatonin 3 MG TABS Take by mouth.    [provider]  nortriptyline (PAMELOR) 50 MG capsule Take by mouth. 11/06/18   [provider]  oxycodone (OXY-IR) 5 MG capsule Take by mouth.    [provider]  pantoprazole (PROTONIX) 20 MG tablet  06/20/17   [provider]  potassium chloride (KLOR-CON) 20 MEQ packet Take 20 mEq by mouth 2 (two) times daily for 3 days. 05/14/17 05/17/17  Houston Siren, MD  pregabalin  (LYRICA) 75 MG capsule Take by mouth. 06/23/19   [provider]  senna (SENOKOT) 8.6 MG tablet Take by mouth.    [provider]  sodium chloride 1 g tablet Take by mouth.    [provider]  thiamine 100 MG tablet Take by mouth.    [provider]  torsemide (DEMADEX) 10 MG tablet Take by mouth.    [provider]  traZODone (DESYREL) 50 MG tablet Take by mouth. 02/20/18   [provider]  triamcinolone cream (KENALOG) 0.1 % Apply topically.    [provider]  venlafaxine (EFFEXOR) 75 MG tablet Take by mouth. 11/06/18   [provider]  Wheat Dextrin (BENEFIBER) POWD Take by mouth. 06/20/17   [provider]    Allergies Erythromycin, Septra [sulfamethoxazole-trimethoprim], Sulfamethoxazole, and Trimethoprim  Family History  Problem Relation Age of Onset  . Heart disease Father   . Diabetes Paternal Grandmother   . Heart disease Paternal Grandfather     Social History Social History   Tobacco Use  . Smoking status: Current Every Day Smoker    Packs/day: 0.50  . Smokeless tobacco: Never Used  Vaping Use  . Vaping Use: Every day  Substance Use Topics  . Alcohol use: Yes    Comment: occasional  . Drug use: No    Review of Systems  Review of Systems  Constitutional: Positive for fatigue. Negative for fever.  HENT: Negative for congestion and sore throat.   Eyes: Negative for visual disturbance.  Respiratory: Negative for cough and shortness of breath.   Cardiovascular: Negative for chest pain.  Gastrointestinal: Negative for abdominal pain, diarrhea, nausea and vomiting.  Genitourinary: Negative for flank pain.  Musculoskeletal: Negative for back pain and neck pain.  Skin: Negative for rash and wound.  Neurological: Positive for weakness.  Psychiatric/Behavioral: Positive for confusion.  All other systems reviewed and are negative.    ____________________________________________  PHYSICAL  EXAM:      VITAL SIGNS: ED Triage Vitals  Enc Vitals Group     BP 06/22/20 2027 (!) 149/86     Pulse Rate 06/22/20 2027 90     Resp 06/22/20 2027 19     Temp 06/22/20 2027 98 F (36.7 C)     Temp Source 06/22/20 2027 Oral     SpO2 06/22/20 2027 94 %     Weight 06/22/20 2025 216 lb 0.8 oz (98 kg)     Height 06/22/20 2025 5' (1.524 m)     Head Circumference --      Peak Flow --      Pain Score 06/22/20 2024 5     Pain Loc --      Pain Edu? --      Excl. in GC? --      Physical Exam Vitals and nursing note reviewed.  Constitutional:      General:  She is not in acute distress.    Appearance: She is well-developed and well-nourished.  HENT:     Head: Normocephalic and atraumatic.  Eyes:     Conjunctiva/sclera: Conjunctivae normal.  Cardiovascular:     Rate and Rhythm: Normal rate and regular rhythm.     Heart sounds: Normal heart sounds.  Pulmonary:     Effort: Pulmonary effort is normal. No respiratory distress.     Breath sounds: No wheezing.  Abdominal:     General: There is no distension.  Musculoskeletal:        General: No edema.     Cervical back: Neck supple.  Skin:    General: Skin is warm.     Capillary Refill: Capillary refill takes less than 2 seconds.     Findings: No rash.  Neurological:     Mental Status: She is alert. She is disoriented.     Motor: No abnormal muscle tone.     Comments: Oriented to person and place, but not time.  Left hemiparesis which is baseline.  Strength 5 and 5 in the right upper and lower extremities though globally weak.       ____________________________________________   LABS (all labs ordered are listed, but only abnormal results are displayed)  Labs Reviewed - No data to display  ____________________________________________  EKG: Normal sinus rhythm, VR 90. QRS 127, QTc 558.  Nonspecific intraventricular conduction delay.  No ST elevations. ________________________________________  RADIOLOGY All imaging,  including plain films, CT scans, and ultrasounds, independently reviewed by me, and interpretations confirmed via formal radiology reads.  ED MD interpretation:   Chest x-ray: Low lung volumes, no acute disease  Official radiology report(s): No results found.  ____________________________________________  PROCEDURES   Procedure(s) performed (including Critical Care):  .1-3 Lead EKG Interpretation Performed by: Shaune Pollack, MD Authorized by: Shaune Pollack, MD     Interpretation: normal     ECG rate:  90-100   ECG rate assessment: normal     Rhythm: sinus rhythm     Ectopy: none     Conduction: normal   Comments:     Indication: hypokalemia    ____________________________________________  INITIAL IMPRESSION / MDM / ASSESSMENT AND PLAN / ED COURSE  As part of my medical decision making, I reviewed the following data within the electronic MEDICAL RECORD NUMBER Nursing notes reviewed and incorporated, Old chart reviewed, Notes from prior ED visits, and  Controlled Substance Database       *LYNEA Bender was evaluated in Emergency Department on 06/22/2020 for the symptoms described in the history of present illness. She was evaluated in the context of the global COVID-19 pandemic, which necessitated consideration that the patient might be at risk for infection with the SARS-CoV-2 virus that causes COVID-19. Institutional protocols and algorithms that pertain to the evaluation of patients at risk for COVID-19 are in a state of rapid change based on information released by regulatory bodies including the CDC and federal and state organizations. These policies and algorithms were followed during the patient's care in the ED.  Some ED evaluations and interventions may be delayed as a result of limited staffing during the pandemic.*     Medical Decision Making: 55 year old female here with altered mental status and weakness.  Lab work shows significant hypokalemia as well as mild  transaminitis and hyponatremia.  I suspect this is related to hypovolemia in the setting of poor p.o. intake from recent COVID-19 infection.  CT head is without acute abnormality and  she has no new focal neurological deficits.  Chest x-ray is clear.  She is not hypoxic.  EKG nonischemic.  Troponin negative.  Will admit for electrolyte replacement and further monitoring.  Of note, the patient does have history of altered mental status in the setting of UTI so I have ordered UA as well.  Family updated and in agreement.  ____________________________________________  FINAL CLINICAL IMPRESSION(S) / ED DIAGNOSES  Final diagnoses:  None     MEDICATIONS GIVEN DURING THIS VISIT:  Medications - No data to display   ED Discharge Orders    None       Note:  This document was prepared using Dragon voice recognition software and may include unintentional dictation errors.   Shaune PollackIsaacs, Anetha Slagel, MD 06/22/20 [phone]

## 2020-06-22 NOTE — ED Notes (Signed)
Pt states mild twitching to the right sided extremities. Right foot noted by this RN

## 2020-06-22 NOTE — ED Notes (Signed)
Pt states coming in for "lethargia". Pt has family at bedside. Pt on cardiac, bp and pulse ox monitoring.

## 2020-06-22 NOTE — Progress Notes (Signed)
PHARMACIST - PHYSICIAN COMMUNICATION  CONCERNING:  Enoxaparin (Lovenox) for DVT Prophylaxis    RECOMMENDATION: Patient was prescribed enoxaprin 40mg  q24 hours for VTE prophylaxis.   Filed Weights   06/22/20 2025  Weight: 98 kg (216 lb 0.8 oz)    Body mass index is 42.19 kg/m.  Estimated Creatinine Clearance: 77.6 mL/min (by C-G formula based on SCr of 0.87 mg/dL).   Based on [facility] policy patient is candidate for enoxaparin 0.5mg /kg TBW SQ every 24 hours based on BMI being >30.  DESCRIPTION: Pharmacy has adjusted enoxaparin dose per [facility] policy.  Patient is now receiving enoxaparin 0.5 mg/kg every 24 hours   [facility], PharmD, [facility] 06/23/2020 12:00 AM

## 2020-06-23 ENCOUNTER — Other Ambulatory Visit: Payer: Self-pay

## 2020-06-23 ENCOUNTER — Encounter: Payer: Self-pay | Admitting: Internal Medicine

## 2020-06-23 ENCOUNTER — Observation Stay: Payer: Medicare Other

## 2020-06-23 DIAGNOSIS — U071 COVID-19: Secondary | ICD-10-CM

## 2020-06-23 DIAGNOSIS — R400 Somnolence: Secondary | ICD-10-CM

## 2020-06-23 DIAGNOSIS — E876 Hypokalemia: Secondary | ICD-10-CM | POA: Diagnosis not present

## 2020-06-23 DIAGNOSIS — G9341 Metabolic encephalopathy: Secondary | ICD-10-CM | POA: Diagnosis not present

## 2020-06-23 LAB — BASIC METABOLIC PANEL
Anion gap: 15 (ref 5–15)
BUN: 20 mg/dL (ref 6–20)
CO2: 31 mmol/L (ref 22–32)
Calcium: 9 mg/dL (ref 8.9–10.3)
Chloride: 89 mmol/L — ABNORMAL LOW (ref 98–111)
Creatinine, Ser: 0.88 mg/dL (ref 0.44–1.00)
GFR, Estimated: >60 mL/min (ref >60)
Glucose, Bld: 110 mg/dL — ABNORMAL HIGH (ref 70–99)
Potassium: 3 mmol/L — ABNORMAL LOW (ref 3.5–5.1)
Sodium: 135 mmol/L (ref 135–145)

## 2020-06-23 LAB — CBC
HCT: 42.3 % (ref 36.0–46.0)
Hemoglobin: 14.2 g/dL (ref 12.0–15.0)
MCH: 31.4 pg (ref 26.0–34.0)
MCHC: 33.6 g/dL (ref 30.0–36.0)
MCV: 93.6 fL (ref 80.0–100.0)
Platelets: 425 10*3/uL — ABNORMAL HIGH (ref 150–400)
RBC: 4.52 MIL/uL (ref 3.87–5.11)
RDW: 13.3 % (ref 11.5–15.5)
WBC: 9.6 10*3/uL (ref 4.0–10.5)
nRBC: 0 % (ref 0.0–0.2)

## 2020-06-23 LAB — RESP PANEL BY RT-PCR (FLU A&B, COVID) ARPGX2
Influenza A by PCR: NEGATIVE
Influenza B by PCR: NEGATIVE
SARS Coronavirus 2 by RT PCR: POSITIVE — AB

## 2020-06-23 LAB — HIV ANTIBODY (ROUTINE TESTING W REFLEX): HIV Screen 4th Generation wRfx: NONREACTIVE

## 2020-06-23 MED ORDER — POTASSIUM CHLORIDE 20 MEQ PO PACK: 40.0000 meq | PACK | Freq: Every day | ORAL | 0 refills | Status: AC

## 2020-06-23 MED ORDER — TORSEMIDE 10 MG PO TABS: ORAL_TABLET | ORAL | Status: AC

## 2020-06-23 MED ORDER — LEVOFLOXACIN 250 MG PO TABS: ORAL_TABLET | ORAL | Status: AC

## 2020-06-23 MED ORDER — LISINOPRIL 10 MG PO TABS: ORAL_TABLET | ORAL | 3 refills | Status: AC

## 2020-06-23 MED ORDER — POTASSIUM CHLORIDE CRYS ER 20 MEQ PO TBCR
40.0000 meq | EXTENDED_RELEASE_TABLET | Freq: Once | ORAL | Status: AC
  Administered 2020-06-23: 40 meq via ORAL
  Filled 2020-06-23: qty 2

## 2020-06-23 MED ORDER — POTASSIUM CHLORIDE 10 MEQ/100ML IV SOLN
10.0000 meq | Freq: Once | INTRAVENOUS | Status: AC
  Administered 2020-06-23: 10 meq via INTRAVENOUS
  Filled 2020-06-23: qty 100

## 2020-06-23 NOTE — Evaluation (Addendum)
Physical Therapy Evaluation Patient Details Name: Terri Bender MRN: [id number] DOB: March 03, 1966 Today's Date: 06/23/2020   History of Present Illness  Pt is a 55 y.o. female with medical history significant for HTN, left hemiplegia and dysarthria from old R MCA stroke, obesity, anxiety and depression, history of COVID-19 infection 2 weeks prior, symptomatic only for chest and sinus congestion, who was brought to the emergency room because of generalized weakness, and altered mental status. covid +, bells palsy.    Clinical Impression  Pt alert, oriented to self, did exhibit situational confusion (referenced in correct room number but correct hospital, stated a dog was under her bed and crying all night), but stated she felt more back to normal than yesterday. Pt initially stated she is able to stand and sit in her wheelchair, but later in the session referenced a lift to get her out of bed. Reported needing assistance for ADLs. Unable to reach correct RN at [facility] to verify information.  The patient exhibited generalized RUE and RLE weakness; unable to move LLE or LUE, flaccidity noted. Supine <> sit, maxA to sit EOB; pt able to initiate movement but ultimately required maxA to achieve and total to reposition to midline. Fair balance noted for several minutes with slouched posture and RUE propping. Sit <> stand attempted twice with maxA, unable to come fully into standing (Reclienr placed to R of patient to allow her to pull to assist). Returned to supine totalA and pt able to participate in repositioning in bed.  Overall the patient demonstrated deficits (see "PT Problem List") that impede the patient's functional abilities, safety, and mobility and would benefit from skilled PT intervention. Recommendation is HHPT at ALF if facility feels they are able to provide adequate physical assistance. If unable, pt would benefit from SNF to return to PLOF to maximize strength, function, and  independence.      Follow Up Recommendations Home health PT;Supervision/Assistance - 24 hour;SNF    Equipment Recommendations  None recommended by PT    Recommendations for Other Services       Precautions / Restrictions Precautions Precautions: Fall Restrictions Weight Bearing Restrictions: No      Mobility  Bed Mobility Overal bed mobility: Needs Assistance Bed Mobility: Supine to Sit;Sit to Supine     Supine to sit: Max assist;HOB elevated Sit to supine: Total assist   General bed mobility comments: use of bed rails, cueing for technique, ultimately maxA. Pt able to assist with repositioning in supine with cueing    Transfers Overall transfer level: Needs assistance Equipment used: 1 person hand held assist             General transfer comment: pt with RUE on chair arm, maxA to attempt standing, unable to clear buttocks from bed  Ambulation/Gait             General Gait Details: unable  Stairs            Wheelchair Mobility    Modified Rankin (Stroke Patients Only)       Balance Overall balance assessment: Needs assistance Sitting-balance support: Feet supported;Feet unsupported;Single extremity supported Sitting balance-Leahy Scale: Fair Sitting balance - Comments: able to maintain balance in sitting for several minutes, did prefer at least UE support. Unable to shift weight at EOB, totalA for repositioning  Pertinent Vitals/Pain Pain Assessment: No/denies pain    Home Living Family/patient expects to be discharged to:: Assisted living               Home Equipment: Wheelchair - manual      Prior Function Level of Independence: Needs assistance   Gait / Transfers Assistance Needed: pt questionable historian, [facility] called by PT, unable to reach proper RN to verify information. Pt initially reported that she is able to stand and get into her wheelchair, later in  session referenced a lift.  ADL's / Homemaking Assistance Needed: needs assistance for dressing/bathing per pt        Hand Dominance   Dominant Hand: Right    Extremity/Trunk Assessment   Upper Extremity Assessment Upper Extremity Assessment: Generalized weakness;LUE deficits/detail;RUE deficits/detail RUE Deficits / Details: grossly 4-/5 shoulder strength, elbow flex/extension 4/5, grip 4/5 LUE Deficits / Details: flaccid    Lower Extremity Assessment Lower Extremity Assessment: Generalized weakness;LLE deficits/detail;RLE deficits/detail RLE Deficits / Details: grossly 3+/5, able to lift extremity against gravity but effortful for patient LLE Deficits / Details: flaccid       Communication   Communication: No difficulties  Cognition Arousal/Alertness: Awake/alert Behavior During Therapy: Flat affect;WFL for tasks assessed/performed Overall Cognitive Status: No family/caregiver present to determine baseline cognitive functioning                                 General Comments: pt oriented to self, place,did seem to have questionable situational awareness;referenced her room number at [facility], hearing a dog cry all night, and variable reports PLOF per pt      General Comments      Exercises     Assessment/Plan    PT Assessment Patient needs continued PT services  PT Problem List Decreased strength;Decreased activity tolerance;Decreased mobility       PT Treatment Interventions DME instruction;Balance training;Neuromuscular re-education;Functional mobility training;Patient/family education;Therapeutic activities;Therapeutic exercise;Wheelchair mobility training    PT Goals (Current goals can be found in the Care Plan section)  Acute Rehab PT Goals Patient Stated Goal: to get better PT Goal Formulation: With patient Time For Goal Achievement: 07/07/20 Potential to Achieve Goals: Good    Frequency Min 2X/week   Barriers to discharge         Co-evaluation               AM-PAC PT "6 Clicks" Mobility  Outcome Measure Help needed turning from your back to your side while in a flat bed without using bedrails?: Total Help needed moving from lying on your back to sitting on the side of a flat bed without using bedrails?: Total Help needed moving to and from a bed to a chair (including a wheelchair)?: Total Help needed standing up from a chair using your arms (e.g., wheelchair or bedside chair)?: Total Help needed to walk in hospital room?: Total Help needed climbing 3-5 steps with a railing? : Total 6 Click Score: 6    End of Session Equipment Utilized During Treatment: Gait belt Activity Tolerance: Patient tolerated treatment well Patient left: in bed;with call bell/phone within reach;with bed alarm set Nurse Communication: Mobility status PT Visit Diagnosis: Muscle weakness (generalized) (M62.81);Difficulty in walking, not elsewhere classified (R26.2)    Time: 5035-4656 PT Time Calculation (min) (ACUTE ONLY): 34 min   Charges:   PT Evaluation $PT Eval Low Complexity: 1 Low PT Treatments $Therapeutic Activity: 23-37 mins  Olga Coaster PT, DPT 2:13 PM,06/23/20

## 2020-06-23 NOTE — ED Notes (Addendum)
Dr. Para March notified pt tested positive for covid-19.  Pt transported to CT and then to room by RN. Pt on monitor.

## 2020-06-23 NOTE — Plan of Care (Signed)
New care plan initiated 

## 2020-06-23 NOTE — NC FL2 (Signed)
  [place] MEDICAID FL2 LEVEL OF CARE SCREENING TOOL     IDENTIFICATION  Patient Name: Terri Bender Birthdate: 1966-04-23 Sex: female Admission Date (Current Location): 06/22/2020  [place] and [place] Number:  Chiropodist and Address:  [facility], [address] [place], [place] [address]      Provider Number: [id number]  Attending Physician Name and Address:  Darlin Priestly, MD  Relative Name and Phone Number:  Warren Danes [phone]    Current Level of Care: Hospital Recommended Level of Care: Nursing Facility Prior Approval Number:    Date Approved/Denied:   PASRR Number:    Discharge Plan: SNF    Current Diagnoses: Patient Active Problem List   Diagnosis Date Noted  . COVID-19 virus infection 06/23/2020  . Hypokalemia 06/22/2020  . Generalized weakness 06/22/2020  . History of COVID-19 06/22/2020  . Elevated LFTs 06/22/2020  . Acute metabolic encephalopathy 06/22/2020  . Obesity, Class III, BMI 40-49.9 (morbid obesity) (HCC) 06/22/2020  . Dysarthria as late effect of cerebrovascular accident (CVA) 06/22/2020  . Hemiplegia left side as late effect of cerebral infarction (HCC) 06/22/2020  . Anxiety and depression 06/22/2020  . Adjustment disorder with mixed disturbance of emotions and conduct 01/21/2018  . CVA (cerebral vascular accident) (HCC) 05/11/2017  . HTN (hypertension) 05/11/2017  . Smoker 05/08/2017    Orientation RESPIRATION BLADDER Height & Weight     Self,Time,Situation,Place  Normal Incontinent Weight: 98 kg Height:  5' (152.4 cm)  BEHAVIORAL SYMPTOMS/MOOD NEUROLOGICAL BOWEL NUTRITION STATUS      Incontinent Diet (Heart healthy)  AMBULATORY STATUS COMMUNICATION OF NEEDS Skin   Extensive Assist Verbally Normal                       Personal Care Assistance Level of Assistance  Bathing,Feeding,Dressing Bathing Assistance: Maximum assistance Feeding assistance: Limited assistance Dressing Assistance:  Maximum assistance     Functional Limitations Info  Sight,Hearing,Speech Sight Info: Adequate Hearing Info: Adequate Speech Info:  (difficult at times)    SPECIAL CARE FACTORS FREQUENCY                       Contractures Contractures Info: Not present    Additional Factors Info  Code Status,Allergies,Isolation Precautions Code Status Info: Full Allergies Info: Erythromycin, Septra, Sulfamethoxazole, Trimethoprim     Isolation Precautions Info: Airborne d/t Covid + 2 weeks ago     Current Medications (06/23/2020):  This is the current hospital active medication list Current Facility-Administered Medications  Medication Dose Route Frequency Provider Last Rate Last Admin  . acetaminophen (TYLENOL) tablet 650 mg  650 mg Oral Q6H PRN Andris Baumann, MD       Or  . acetaminophen (TYLENOL) suppository 650 mg  650 mg Rectal Q6H PRN Andris Baumann, MD      . enoxaparin (LOVENOX) injection 50 mg  0.5 mg/kg Subcutaneous Q24H Otelia Sergeant, RPH   50 mg at 06/23/20 1025  . ondansetron (ZOFRAN) tablet 4 mg  4 mg Oral Q6H PRN Andris Baumann, MD       Or  . ondansetron [facility]) injection 4 mg  4 mg Intravenous Q6H PRN Andris Baumann, MD         Discharge Medications: Please see discharge summary for a list of discharge medications.  Relevant Imaging Results:  Relevant Lab Results:   Additional Information SSN [id number]  Trenton Founds, RN

## 2020-06-23 NOTE — H&P (Signed)
History and Physical    Francetta FoundLisa S Lansky ZOX:[id number] DOB: [birth year] DOA: 06/22/2020  PCP: Keane PoliceSlade-Hartman, Venezela, MD   Patient coming from: home  I have personally briefly reviewed patient[facility]  Chief Complaint: weakness, hypokalemia  HPI: Francetta FoundLisa S Seger is a 55 y.o. female with medical history significant for HTN, left hemiplegia and dysarthria from old R MCA stroke, obesity, anxiety and depression, history of COVID-19 infection 2 weeks prior, symptomatic only for chest and sinus congestion, who was brought to the emergency room because of generalized weakness, and altered mental status.  Most of the history is provided by patient's mother due to altered mental status.  According to mother, at baseline patient is able to maintain a conversation except for intermittent difficulty articulating her words due to late effects of the stroke.  She is able to assist in ADLs with the use of her right side.  Today however, patient was noted to be weak and unable to hold things such as a cup with her right hand and seemed to be very somnolent, sleepy and not staying awake.  No reports of cough or shortness of breath, chest pain, abdominal pain, nausea or vomiting.  Blood work done at the facility revealed hypokalemia and patient was sent to the emergency room for evaluation. ED course: On arrival, afebrile at 98, BP 149/86, pulse 90, respirations 19 with O2 sat 94% on room air.  CBC mostly WNL.  CMP significant for potassium of 2.5 which appears to be slightly lower compared to 2.7 3 months prior.  Also noted to have elevated liver enzymes with AST 121, ALT 98 and bilirubin 1.4, all new compared to 3 months ago.  COVID remains positive.  Troponin negative x2, magnesium 2.5, BNP 31.3 EKG as interpreted by me: NSR at 90 with elevated QTC of 558 with nonspecific ST-T wave changes Imaging: Chest x-ray without definite focal airspace disease CT head: No acute intracranial  abnormality.  Large chronic right MCA distribution infarct CT abdomen and pelvis: Hepatic steatosis.  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  No acute abnormalities otherwise.  Patient received IV and oral potassium in the ED.  Hospitalist consulted for admission.  Review of Systems: Unable to obtain due to altered mental status  Past Medical History:  Diagnosis Date  . Anxiety   . Depression   . Frequent headaches   . History of chicken pox   . Hypertension   . Stroke [facility](HCC)     Past Surgical History:  Procedure Laterality Date  . CESAREAN SECTION    . FOOT SURGERY Left    bone spur  . LOOP RECORDER INSERTION N/A 09/04/2017   Procedure: LOOP RECORDER INSERTION;  Surgeon: Marcina MillardParaschos, Alexander, MD;  Location: [facility] INVASIVE CV LAB;  Service: Cardiovascular;  Laterality: N/A;     reports that she has been smoking. She has been smoking about 0.50 packs per day. She has never used smokeless tobacco. She reports current alcohol use. She reports that she does not use drugs.  Allergies  Allergen Reactions  . Erythromycin Nausea And Vomiting  . Septra [Sulfamethoxazole-Trimethoprim] Hives  . Sulfamethoxazole Other (See Comments)  . Trimethoprim Other (See Comments)    Family History  Problem Relation Age of Onset  . Heart disease Father   . Diabetes Paternal Grandmother   . Heart disease Paternal Grandfather       Prior to Admission medications   Medication Sig Start Date End Date Taking? Authorizing Provider  acetaminophen (TYLENOL) 325 MG tablet Take by mouth.    [provider]  acyclovir ointment (ZOVIRAX) 5 % Apply topically. 04/12/20   [provider]  aspirin EC 325 MG EC tablet Take 1 tablet (325 mg total) by mouth daily. 05/15/17   Houston Siren, MD  atorvastatin (LIPITOR) 40 MG tablet Take 40 mg by mouth at bedtime. 06/16/20   [provider]  atorvastatin (LIPITOR) 80 MG tablet Take 1 tablet (80 mg total) by  mouth daily at 6 PM. 05/16/17   Sainani, Rolly Pancake, MD  Baclofen 5 MG TABS Take 5 mg by mouth at bedtime. 06/16/20   [provider]  cholecalciferol (VITAMIN D) 25 MCG (1000 UNIT) tablet Take 1,000 Units by mouth daily. 02/01/20   [provider]  Cholecalciferol 5000 units capsule Take 1 capsule (5,000 Units total) by mouth daily. 03/20/17   Emi Belfast, FNP  clonazePAM (KLONOPIN) 0.5 MG tablet Take 1 tablet (0.5 mg total) by mouth 2 (two) times daily as needed for anxiety. 03/20/17   Emi Belfast, FNP  cyanocobalamin (,VITAMIN B-12,) 1000 MCG/ML injection  02/19/18   [provider]  escitalopram (LEXAPRO) 10 MG tablet Take 1 tablet (10 mg total) by mouth daily. 03/20/17   Emi Belfast, FNP  feeding supplement, ENSURE ENLIVE, (ENSURE ENLIVE) LIQD Take 237 mLs by mouth 2 (two) times daily between meals. 05/14/17   Houston Siren, MD  Fremanezumab-vfrm (AJOVY) 225 MG/1.5ML SOAJ Inject into the skin. 06/22/19   [provider]  guaifenesin (ROBITUSSIN) 100 MG/5ML syrup Take by mouth.    [provider]  hydrocortisone cream 1 % Apply 1 application topically as directed. 04/05/20   [provider]  Lactobacillus (ACIDOPHILUS) CAPS capsule Take by mouth.    [provider]  levofloxacin (LEVAQUIN) 250 MG tablet Take 250 mg by mouth daily. 02/01/20   [provider]  levothyroxine (SYNTHROID) 50 MCG tablet Take 50 mcg by mouth daily. 04/21/20   [provider]  levothyroxine (SYNTHROID, LEVOTHROID) 25 MCG tablet Take 25 mcg by mouth daily before breakfast.    [provider]  lisinopril (PRINIVIL,ZESTRIL) 10 MG tablet Take 1 tablet (10 mg total) by mouth daily. 04/05/17   Emi Belfast, FNP  Magnesium (V-R MAGNESIUM) 250 MG TABS Take by mouth.    [provider]  Melatonin 3 MG TABS Take by mouth.    [provider]  nortriptyline (PAMELOR) 50 MG capsule Take by mouth. 11/06/18    [provider]  oxycodone (OXY-IR) 5 MG capsule Take by mouth.    [provider]  pantoprazole (PROTONIX) 20 MG tablet  06/20/17   [provider]  potassium chloride (KLOR-CON) 20 MEQ packet Take 20 mEq by mouth 2 (two) times daily for 3 days. 05/14/17 05/17/17  Houston Siren, MD  pregabalin (LYRICA) 75 MG capsule Take by mouth. 06/23/19   [provider]  senna (SENOKOT) 8.6 MG tablet Take by mouth.    [provider]  sodium chloride 1 g tablet Take by mouth.    [provider]  thiamine 100 MG tablet Take by mouth.    [provider]  torsemide (DEMADEX) 10 MG tablet Take by mouth.    [provider]  traZODone (DESYREL) 50 MG tablet Take by mouth. 02/20/18   [provider]  triamcinolone cream (KENALOG) 0.1 % Apply topically.    [provider]  venlafaxine (EFFEXOR) 75 MG tablet Take by mouth.  11/06/18   [provider]  VYVANSE 30 MG capsule Take 30 mg by mouth at bedtime. 06/20/20   [provider]  Wheat Dextrin (BENEFIBER) POWD Take by mouth. 06/20/17   [provider]    Physical Exam: Vitals:   06/22/20 2027 06/22/20 2300 06/22/20 2337 06/23/20 0051  BP: (!) 149/86 135/66  100/86  Pulse: 90 92  73  Resp: Temp: 98 F (36.7 C)  98.3 F (36.8 C) 98.5 F (36.9 C)  TempSrc: Oral  Oral   SpO2: 94% 93%  94%  Weight:      Height:         Vitals:   06/22/20 2027 06/22/20 2300 06/22/20 2337 06/23/20 0051  BP: (!) 149/86 135/66  100/86  Pulse: 90 92  73  Resp: Temp: 98 F (36.7 C)  98.3 F (36.8 C) 98.5 F (36.9 C)  TempSrc: Oral  Oral   SpO2: 94% 93%  94%  Weight:      Height:          Constitutional:  Somnolent, arousable but readily falls back asleep.  Difficult to assess orientation. Not in any apparent distress HEENT:      Head: Normocephalic and atraumatic.         Eyes: PERLA, EOMI, Conjunctivae are normal. Sclera is  non-icteric.       Mouth/Throat: Mucous membranes are moist.       Neck: Supple with no signs of meningismus. Cardiovascular: Regular rate and rhythm. No murmurs, gallops, or rubs. 2+ symmetrical distal pulses are present . No JVD. No LE edema Respiratory: Respiratory effort normal .Lungs sounds clear bilaterally. No wheezes, crackles, or rhonchi.  Gastrointestinal: Soft, non tender, and non distended with positive bowel sounds.  Genitourinary: No CVA tenderness. Musculoskeletal: Nontender.  Muscle disuse atrophy upper and lower extremity left side with contractures of handmotion in all extremities. No cyanosis, or erythema of extremities. Neurologic:  Face is symmetric. Moving all extremities.  Left hemiplegia skin: Skin is warm, dry.  No rash or ulcers Psychiatric: Mood and affect are normal    Labs on Admission: I have personally reviewed following labs and imaging studies  CBC: Recent Labs  Lab 06/22/20 2031 06/23/20 0023  WBC 7.6 9.6  NEUTROABS 5.8  --   HGB 15.5* 14.2  HCT 45.2 42.3  MCV 93.8 93.6  PLT 416* 425*   Basic Metabolic Panel: Recent Labs  Lab 06/22/20 2031 06/22/20 2037  NA 132*  --   K 2.5*  --   CL 85*  --   CO2 32  --   GLUCOSE 151*  --   BUN 18  --   CREATININE 0.87  --   CALCIUM 9.3  --   MG  --  2.5*   GFR: Estimated Creatinine Clearance: 77.6 mL/min (by C-G formula based on SCr of 0.87 mg/dL). Liver Function Tests: Recent Labs  Lab 06/22/20 2031  AST 121*  ALT 98*  ALKPHOS 123  BILITOT 1.4*  PROT 7.5  ALBUMIN 3.3*   No results for input(s): LIPASE, AMYLASE in the last 168 hours. No results for input(s): AMMONIA in the last 168 hours. Coagulation Profile: No results for input(s): INR, PROTIME in the last 168 hours. Cardiac Enzymes: No results for input(s): CKTOTAL, CKMB, CKMBINDEX, TROPONINI in the last 168 hours. BNP (last 3 results) No results for input(s): PROBNP in the last 8760 hours. HbA1C: No results for input(s): HGBA1C in  the last 72 hours. CBG: No results for input(s): GLUCAP in the last 168 hours. Lipid Profile: No results for input(s): CHOL, HDL, LDLCALC, TRIG, CHOLHDL, LDLDIRECT in the last 72 hours. Thyroid Function Tests: No results for input(s): TSH, T4TOTAL, FREET4, T3FREE, THYROIDAB in the last 72 hours. Anemia Panel: No results for input(s): VITAMINB12, FOLATE, FERRITIN, TIBC, IRON, RETICCTPCT in the last 72 hours. Urine analysis:    Component Value Date/Time   COLORURINE YELLOW (A) 05/12/2017 0439   APPEARANCEUR TURBID (A) 05/12/2017 0439   LABSPEC 1.019 05/12/2017 0439   PHURINE 6.0 05/12/2017 0439   GLUCOSEU NEGATIVE 05/12/2017 0439   HGBUR SMALL (A) 05/12/2017 0439   BILIRUBINUR NEGATIVE 05/12/2017 0439   KETONESUR 20 (A) 05/12/2017 0439   PROTEINUR 100 (A) 05/12/2017 0439   NITRITE NEGATIVE 05/12/2017 0439   LEUKOCYTESUR MODERATE (A) 05/12/2017 0439    Radiological Exams on Admission: CT ABDOMEN PELVIS WO CONTRAST  Result Date: 06/23/2020 CLINICAL DATA:  Nausea, vomiting, hypokalemia with elevated LFTs EXAM: CT ABDOMEN AND PELVIS WITHOUT CONTRAST TECHNIQUE: Multidetector CT imaging of the abdomen and pelvis was performed following the standard protocol without IV contrast. COMPARISON:  None. FINDINGS: Lower chest: Some dependent and subsegmental bandlike atelectatic changes in the lung bases. Normal heart size. No pericardial effusion. Abundant mediastinal and pericardial fat. Hepatobiliary: Diffuse hepatic hypoattenuation compatible with hepatic steatosis. Sparing along the gallbladder fossa. No significant gallbladder wall thickening, pericholecystic fluid or inflammation or visible calcified gallstones. No biliary ductal dilatation. Pancreas: Fatty pancreas with mild atrophy. No pancreatic ductal dilatation or surrounding inflammatory changes. Spleen: Normal in size. No concerning splenic lesions. Adrenals/Urinary Tract: Normal adrenals. Mild symmetric bilateral perinephric stranding, a  nonspecific finding which may correlate with advanced age or decreased renal function. Kidneys are fairly symmetric in size accounting for differences in rotation. No visible or contour deforming renal lesion. No urolithiasis or hydronephrosis. Urinary bladder is unremarkable for the degree of distention. Stomach/Bowel: Distal esophagus and stomach are unremarkable. Small air and contrast filled duodenal diverticulum (2/38) no adjacent inflammation. Duodenum with a normal course across the midline abdomen. No small bowel thickening or dilatation. Normal appendix in the right lower quadrant. Intramural fatty infiltration of the cecum and ascending colon may reflect sequela of prior inflammation or a lipomatous body habitus. No colonic dilatation or wall thickening. No evidence of bowel obstruction. Vascular/Lymphatic: No significant vascular findings are present. No enlarged abdominal or pelvic lymph nodes. Focal region of faint mid mesenteric hazy stranding with numerous reactive appearing clustered mid mesenteric lymph nodes compatible with mesenteritis (2/38). Reproductive: Status post hysterectomy. No adnexal masses. Other: No abdominopelvic free fluid or free gas. No bowel containing hernias. Rectus diastasis. Musculoskeletal: No acute osseous abnormality or suspicious osseous lesion. Asymmetric atrophy of the left gluteal and pelvic musculature. IMPRESSION: 1. Focal region of faint mid mesenteric hazy stranding with numerous reactive appearing clustered mid mesenteric lymph nodes compatible with mesenteritis. 2. Hepatic steatosis. 3. Mild symmetric bilateral perinephric stranding, a nonspecific finding which may correlate with advanced age or decreased renal function. Correlate with urinalysis to exclude ascending urinary tract infection. 4. Asymmetric mild atrophy of the left gluteal and pelvic musculature. Electronically Signed   By: Kreg Shropshire M.D.   On: 06/23/2020 00:22   CT Head Wo Contrast  Result  Date: 06/22/2020 CLINICAL DATA:  Altered mental status. EXAM: CT HEAD WITHOUT CONTRAST TECHNIQUE: Contiguous axial images were obtained from the base of the skull through the vertex without intravenous contrast. COMPARISON:  February 28, 2020 FINDINGS: Brain:  There is mild cerebral atrophy with widening of the extra-axial spaces and ventricular dilatation. There are areas of decreased attenuation within the white matter tracts of the supratentorial brain, consistent with microvascular disease changes. A large area of encephalomalacia, with adjacent chronic white matter low attenuation, is seen within the right temporal and right parietal lobes. This is present on the prior study. Vascular: No hyperdense vessel or unexpected calcification. Skull: Normal. Negative for fracture or focal lesion. Sinuses/Orbits: No acute finding. Other: None. IMPRESSION: 1. Large, chronic right MCA distribution infarct. 2. No acute intracranial abnormality. Electronically Signed   By: Aram Candela M.D.   On: 06/22/2020 21:12   DG Chest Portable 1 View  Result Date: 06/22/2020 CLINICAL DATA:  Confusion EXAM: PORTABLE CHEST 1 VIEW COMPARISON:  05/11/2017 FINDINGS: Low lung volumes. Electronic device over left lower chest. No focal consolidation or effusion. No pneumothorax. IMPRESSION: Low lung volumes results in bronchovascular crowding. No definite focal airspace disease. Electronically Signed   By: Jasmine Pang M.D.   On: 06/22/2020 20:58     Assessment/Plan 55 year old female with history of HTN, residual left hemiplegia and dysarthria from old R MCA stroke, obesity, anxiety and depression, history of COVID-19 infection 2 weeks prior, symptomatic only for chest and sinus congestion, who was brought to the emergency room because of generalized weakness, and altered mental status.  Blood test at nursing home revealed low potassium.    Generalized weakness   Acute metabolic encephalopathy - Patient found to be very  somnolent which is not her baseline.  Per mother, has retained her intellectual capacity and has conversations except for limitations of intermittent dysarthria -Head CT negative for acute stroke. -Suspect secondary to hypokalemia -Patient is on multiple sedating meds which can also be contributing.  On trazodone, pregabalin, oxycodone, baclofen -Consider EEG, after correction of potassium -Treat hypokalemia as outlined below -Continue neurologic checks -Fall and aspiration precautions   Hypokalemia, recurrent -Evidence of low potassium around 2.5 as far back as 2019 on chart review -No recent history of GI losses even with recent COVID infection -Torsemide appears to be the only culprit medication but will consult pharmacy to review -Can consider nephrology consult -IV and oral repletion -Continue to monitor    Elevated LFTs/hepatic steatosis -Mild elevation in AST/ALT and bilirubin compared to 3 months prior when it was normal -Hepatic steatosis on CT abdomen and pelvis -Continue to monitor    COVID-19 virus infection -Patient is asymptomatic.  Was symptomatic for chest and sinus congestion a couple weeks prior, no vomiting or diarrhea -Continue airborne precautions is just 14 days out from infection per mother    HTN (hypertension) -Continue antihypertensives    Obesity, Class III, BMI 40-49.9 (morbid obesity) (HCC) -Complicating factor to overall prognosis and care  History of right CVA infarct with late effects of left hemiplegia and dysarthria -Continue aspirin and atorvastatin as well as baclofen -CT head without acute findings    Anxiety and depression -Patient is on multiple psychotropics including Klonopin, Lexapro, nortriptyline, Lyrica, trazodone, Vyvanse and Effexor, all pending med verification    DVT prophylaxis: Lovenox  Code Status: full code  Family Communication:  none  Disposition Plan: Back to previous home environment Consults called: none   Status:.obs    Andris Baumann MD [facility]     06/23/2020, 1:30 AM

## 2020-06-23 NOTE — Discharge Summary (Signed)
Physician Discharge Summary   Terri Bender  female DOB: 1965-05-09  XBM:[id number]  PCP: Keane Police, MD  Admit date: 06/22/2020 Discharge date: 06/23/2020  Admitted From: SNF Disposition:  SNF Updated mother on the phone about discharge plans prior to discharge.  Home Health: Yes CODE STATUS: Full code   Hospital Course:  For full details, please see H&P, progress notes, consult notes and ancillary notes.  Briefly,  Terri Bender is a 55 y.o. female with medical history significant for HTN, left hemiplegia and dysarthria from old R MCA stroke, obesity, anxiety and depression, history of COVID-19 infection 2 weeks prior, symptomatic only for chest and sinus congestion, who was brought to the emergency room because of generalized weakness, and altered mental status.     Acute toxic encephalopathy - Patient found to be very somnolent which is not her baseline.  Per mother, has retained her intellectual capacity and has conversations except for limitations of intermittent dysarthria -Head CT negative for acute stroke. -Patient is on multiple sedating meds which can also be contributing.  On trazodone, pregabalin, oxycodone, baclofen.  Pt is open to having her sedating meds titrated down as outpatient.  Pt's mental status improved and was alert and interactive prior to discharge.  Generalized weakness 2/2 hypokalemia Hx of recurrent hypokalemia -Evidence of low potassium around 2.5 as far back as 2019 on chart review -No recent history of GI losses even with recent COVID infection -Torsemide appears to be the only culprit medication.   Pt received IV and oral potassium supplement.  Weakness appeared to have improved some prior to discharge, and pt worked with PT prior to discharge.  Pt is discharged on potassium 40 mEq for 1 week and should have potassium level rechecked within 1 week of discharge.  Home Torsemide is held due to intermittent low BP.    Elevated  LFTs/hepatic steatosis -Mild elevation in AST/ALT and bilirubin compared to 3 months prior when it was normal -Hepatic steatosis on CT abdomen and pelvis    COVID-19 virus infection -Patient is asymptomatic currently.  Was symptomatic for chest and sinus congestion a couple weeks prior, no vomiting or diarrhea.  Not hypoxic and CXR unremarkable.  No need to treat for COVID.    HTN (hypertension) --BP was soft during hospitalization even without home BP medications.  Home Lisinopril and torsemide held at discharge, pending outpatient followup.    Obesity, Class III, BMI 40-49.9 (morbid obesity) (HCC) -Complicating factor to overall prognosis and care  History of right CVA infarct with late effects of left hemiplegia and dysarthria -Continue aspirin and atorvastatin as well as baclofen -CT head without acute findings    Anxiety and depression -Patient is on multiple psychotropics including Klonopin, Lexapro, nortriptyline, Lyrica, trazodone, Vyvanse and Effexor.  Pt is open to having her sedating meds titrated down as outpatient.     Discharge Diagnoses:  Active Problems:   HTN (hypertension)   Hypokalemia   Generalized weakness   History of COVID-19   Elevated LFTs   Acute metabolic encephalopathy   Obesity, Class III, BMI 40-49.9 (morbid obesity) (HCC)   Dysarthria as late effect of cerebrovascular accident (CVA)   Hemiplegia left side as late effect of cerebral infarction (HCC)   Anxiety and depression   COVID-19 virus infection    Discharge Instructions:  Allergies as of 06/23/2020      Reactions   Erythromycin Nausea And Vomiting   Septra [sulfamethoxazole-trimethoprim] Hives   Sulfamethoxazole Other (See Comments)   Trimethoprim  Other (See Comments)      Medication List    TAKE these medications   acetaminophen 325 MG tablet Commonly known as: TYLENOL Take by mouth.   Acidophilus Caps capsule Take by mouth.   acyclovir ointment 5 % Commonly known as:  ZOVIRAX Apply topically.   Ajovy 225 MG/1.5ML Soaj Generic drug: Fremanezumab-vfrm Inject into the skin.   aspirin 325 MG EC tablet Take 1 tablet (325 mg total) by mouth daily.   atorvastatin 80 MG tablet Commonly known as: LIPITOR Take 1 tablet (80 mg total) by mouth daily at 6 PM. What changed: Another medication with the same name was removed. Continue taking this medication, and follow the directions you see here.   Baclofen 5 MG Tabs Take 5 mg by mouth at bedtime.   Benefiber Powd Take by mouth.   Cholecalciferol 125 MCG (5000 UT) capsule Take 1 capsule (5,000 Units total) by mouth daily.   cholecalciferol 25 MCG (1000 UNIT) tablet Commonly known as: VITAMIN D Take 1,000 Units by mouth daily.   clonazePAM 0.5 MG tablet Commonly known as: KLONOPIN Take 1 tablet (0.5 mg total) by mouth 2 (two) times daily as needed for anxiety.   cyanocobalamin 1000 MCG/ML injection Commonly known as: (VITAMIN B-12)   escitalopram 10 MG tablet Commonly known as: LEXAPRO Take 1 tablet (10 mg total) by mouth daily.   feeding supplement Liqd Take 237 mLs by mouth 2 (two) times daily between meals.   guaifenesin 100 MG/5ML syrup Commonly known as: ROBITUSSIN Take by mouth.   hydrocortisone cream 1 % Apply 1 application topically as directed.   levofloxacin 250 MG tablet Commonly known as: LEVAQUIN Check your outpatient doctor to see if you still need to take this antibiotic. What changed:   how much to take  how to take this  when to take this  additional instructions   levothyroxine 25 MCG tablet Commonly known as: SYNTHROID Take 25 mcg by mouth daily before breakfast. What changed: Another medication with the same name was removed. Continue taking this medication, and follow the directions you see here.   lisinopril 10 MG tablet Commonly known as: ZESTRIL Hold until outpatient followup due to intermittent low blood pressure. What changed:   how much to  take  how to take this  when to take this  additional instructions   melatonin 3 MG Tabs tablet Take by mouth.   nortriptyline 50 MG capsule Commonly known as: PAMELOR Take by mouth.   oxycodone 5 MG capsule Commonly known as: OXY-IR Take by mouth.   pantoprazole 20 MG tablet Commonly known as: PROTONIX   potassium chloride 20 MEQ packet Commonly known as: KLOR-CON Take 40 mEq by mouth daily for 7 days. What changed:   how much to take  when to take this   pregabalin 75 MG capsule Commonly known as: LYRICA Take by mouth.   senna 8.6 MG tablet Commonly known as: SENOKOT Take by mouth.   sodium chloride 1 g tablet Take by mouth.   thiamine 100 MG tablet Take by mouth.   torsemide 10 MG tablet Commonly known as: DEMADEX Hold until outpatient followup due to intermittent low blood pressure. What changed:   how to take this  additional instructions   traZODone 50 MG tablet Commonly known as: DESYREL Take by mouth.   triamcinolone 0.1 % Commonly known as: KENALOG Apply topically.   V-R MAGNESIUM 250 MG Tabs Generic drug: Magnesium Take by mouth.   venlafaxine 75 MG tablet Commonly  known as: EFFEXOR Take by mouth.   Vyvanse 30 MG capsule Generic drug: lisdexamfetamine Take 30 mg by mouth at bedtime.        Follow-up Information    Keane Police, MD. Schedule an appointment as soon as possible for a visit in 1 week(s).   Specialty: Family Medicine Contact information: [address] [place] [address] [phone]               Allergies  Allergen Reactions   Erythromycin Nausea And Vomiting   Septra [Sulfamethoxazole-Trimethoprim] Hives   Sulfamethoxazole Other (See Comments)   Trimethoprim Other (See Comments)     The results of significant diagnostics from this hospitalization (including imaging, microbiology, ancillary and laboratory) are listed below for reference.    Consultations:   Procedures/Studies: CT ABDOMEN PELVIS WO CONTRAST  Result Date: 06/23/2020 CLINICAL DATA:  Nausea, vomiting, hypokalemia with elevated LFTs EXAM: CT ABDOMEN AND PELVIS WITHOUT CONTRAST TECHNIQUE: Multidetector CT imaging of the abdomen and pelvis was performed following the standard protocol without IV contrast. COMPARISON:  None. FINDINGS: Lower chest: Some dependent and subsegmental bandlike atelectatic changes in the lung bases. Normal heart size. No pericardial effusion. Abundant mediastinal and pericardial fat. Hepatobiliary: Diffuse hepatic hypoattenuation compatible with hepatic steatosis. Sparing along the gallbladder fossa. No significant gallbladder wall thickening, pericholecystic fluid or inflammation or visible calcified gallstones. No biliary ductal dilatation. Pancreas: Fatty pancreas with mild atrophy. No pancreatic ductal dilatation or surrounding inflammatory changes. Spleen: Normal in size. No concerning splenic lesions. Adrenals/Urinary Tract: Normal adrenals. Mild symmetric bilateral perinephric stranding, a nonspecific finding which may correlate with advanced age or decreased renal function. Kidneys are fairly symmetric in size accounting for differences in rotation. No visible or contour deforming renal lesion. No urolithiasis or hydronephrosis. Urinary bladder is unremarkable for the degree of distention. Stomach/Bowel: Distal esophagus and stomach are unremarkable. Small air and contrast filled duodenal diverticulum (2/38) no adjacent inflammation. Duodenum with a normal course across the midline abdomen. No small bowel thickening or dilatation. Normal appendix in the right lower quadrant. Intramural fatty infiltration of the cecum and ascending colon may reflect sequela of prior inflammation or a lipomatous body habitus. No colonic dilatation or wall thickening. No evidence of bowel obstruction. Vascular/Lymphatic: No significant vascular findings are present. No  enlarged abdominal or pelvic lymph nodes. Focal region of faint mid mesenteric hazy stranding with numerous reactive appearing clustered mid mesenteric lymph nodes compatible with mesenteritis (2/38). Reproductive: Status post hysterectomy. No adnexal masses. Other: No abdominopelvic free fluid or free gas. No bowel containing hernias. Rectus diastasis. Musculoskeletal: No acute osseous abnormality or suspicious osseous lesion. Asymmetric atrophy of the left gluteal and pelvic musculature. IMPRESSION: 1. Focal region of faint mid mesenteric hazy stranding with numerous reactive appearing clustered mid mesenteric lymph nodes compatible with mesenteritis. 2. Hepatic steatosis. 3. Mild symmetric bilateral perinephric stranding, a nonspecific finding which may correlate with advanced age or decreased renal function. Correlate with urinalysis to exclude ascending urinary tract infection. 4. Asymmetric mild atrophy of the left gluteal and pelvic musculature. Electronically Signed   By: Kreg Shropshire M.D.   On: 06/23/2020 00:22   CT Head Wo Contrast  Result Date: 06/22/2020 CLINICAL DATA:  Altered mental status. EXAM: CT HEAD WITHOUT CONTRAST TECHNIQUE: Contiguous axial images were obtained from the base of the skull through the vertex without intravenous contrast. COMPARISON:  February 28, 2020 FINDINGS: Brain: There is mild cerebral atrophy with widening of the extra-axial spaces and ventricular dilatation. There are areas of  decreased attenuation within the white matter tracts of the supratentorial brain, consistent with microvascular disease changes. A large area of encephalomalacia, with adjacent chronic white matter low attenuation, is seen within the right temporal and right parietal lobes. This is present on the prior study. Vascular: No hyperdense vessel or unexpected calcification. Skull: Normal. Negative for fracture or focal lesion. Sinuses/Orbits: No acute finding. Other: None. IMPRESSION: 1. Large, chronic  right MCA distribution infarct. 2. No acute intracranial abnormality. Electronically Signed   By: Aram Candela M.D.   On: 06/22/2020 21:12   DG Chest Portable 1 View  Result Date: 06/22/2020 CLINICAL DATA:  Confusion EXAM: PORTABLE CHEST 1 VIEW COMPARISON:  05/11/2017 FINDINGS: Low lung volumes. Electronic device over left lower chest. No focal consolidation or effusion. No pneumothorax. IMPRESSION: Low lung volumes results in bronchovascular crowding. No definite focal airspace disease. Electronically Signed   By: Jasmine Pang M.D.   On: 06/22/2020 20:58      Labs: BNP (last 3 results) Recent Labs    06/22/20 2037  BNP 31.3   Basic Metabolic Panel: Recent Labs  Lab 06/22/20 2031 06/22/20 2037 06/23/20 0708  NA 132*  --  135  K 2.5*  --  3.0*  CL 85*  --  89*  CO2 32  --  31  GLUCOSE 151*  --  110*  BUN 18  --  20  CREATININE 0.87  --  0.88  CALCIUM 9.3  --  9.0  MG  --  2.5*  --    Liver Function Tests: Recent Labs  Lab 06/22/20 2031  AST 121*  ALT 98*  ALKPHOS 123  BILITOT 1.4*  PROT 7.5  ALBUMIN 3.3*   No results for input(s): LIPASE, AMYLASE in the last 168 hours. No results for input(s): AMMONIA in the last 168 hours. CBC: Recent Labs  Lab 06/22/20 2031 06/23/20 0023  WBC 7.6 9.6  NEUTROABS 5.8  --   HGB 15.5* 14.2  HCT 45.2 42.3  MCV 93.8 93.6  PLT 416* 425*   Cardiac Enzymes: No results for input(s): CKTOTAL, CKMB, CKMBINDEX, TROPONINI in the last 168 hours. BNP: Invalid input(s): POCBNP CBG: No results for input(s): GLUCAP in the last 168 hours. D-Dimer No results for input(s): DDIMER in the last 72 hours. Hgb A1c No results for input(s): HGBA1C in the last 72 hours. Lipid Profile No results for input(s): CHOL, HDL, LDLCALC, TRIG, CHOLHDL, LDLDIRECT in the last 72 hours. Thyroid function studies No results for input(s): TSH, T4TOTAL, T3FREE, THYROIDAB in the last 72 hours.  Invalid input(s): FREET3 Anemia work up No results for  input(s): VITAMINB12, FOLATE, FERRITIN, TIBC, IRON, RETICCTPCT in the last 72 hours. Urinalysis    Component Value Date/Time   COLORURINE YELLOW (A) 05/12/2017 0439   APPEARANCEUR TURBID (A) 05/12/2017 0439   LABSPEC 1.019 05/12/2017 0439   PHURINE 6.0 05/12/2017 0439   GLUCOSEU NEGATIVE 05/12/2017 0439   HGBUR SMALL (A) 05/12/2017 0439   BILIRUBINUR NEGATIVE 05/12/2017 0439   KETONESUR 20 (A) 05/12/2017 0439   PROTEINUR 100 (A) 05/12/2017 0439   NITRITE NEGATIVE 05/12/2017 0439   LEUKOCYTESUR MODERATE (A) 05/12/2017 0439   Sepsis Labs Invalid input(s): PROCALCITONIN,  WBC,  LACTICIDVEN Microbiology Recent Results (from the past 240 hour(s))  Resp Panel by RT-PCR (Flu A&B, Covid) Nasopharyngeal Swab     Status: Abnormal   Collection Time: 06/22/20 11:00 PM   Specimen: Nasopharyngeal Swab; Nasopharyngeal(NP) swabs in vial transport medium  Result Value Ref Range Status   SARS  Coronavirus 2 by RT PCR POSITIVE (A) NEGATIVE Final    Comment: RESULT CALLED TO, READ BACK BY AND VERIFIED WITH: ashton peters @0009  on 06/23/20 skl (NOTE) SARS-CoV-2 target nucleic acids are DETECTED.  The SARS-CoV-2 RNA is generally detectable in upper respiratory specimens during the acute phase of infection. Positive results are indicative of the presence of the identified virus, but do not rule out bacterial infection or co-infection with other pathogens not detected by the test. Clinical correlation with patient history and other diagnostic information is necessary to determine patient infection status. The expected result is Negative.  Fact Sheet for Patients: 06/25/20  Fact Sheet for Healthcare Providers: BloggerCourse.com  This test is not yet approved or cleared by the [employer] FDA and  has been authorized for detection and/or diagnosis of SARS-CoV-2 by FDA under an Emergency Use Authorization (EUA).  This EUA will remain in  effect (meaning this test can b e used) for the duration of  the COVID-19 declaration under Section 564(b)(1) of the Act, 21 U.S.C. section 360bbb-3(b)(1), unless the authorization is terminated or revoked sooner.     Influenza A by PCR NEGATIVE NEGATIVE Final   Influenza B by PCR NEGATIVE NEGATIVE Final    Comment: (NOTE) The Xpert Xpress SARS-CoV-2/FLU/RSV plus assay is intended as an aid in the diagnosis of influenza from Nasopharyngeal swab specimens and should not be used as a sole basis for treatment. Nasal washings and aspirates are unacceptable for Xpert Xpress SARS-CoV-2/FLU/RSV testing.  Fact Sheet for Patients: [place]  Fact Sheet for Healthcare Providers: BloggerCourse.com  This test is not yet approved or cleared by the [employer] FDA and has been authorized for detection and/or diagnosis of SARS-CoV-2 by FDA under an Emergency Use Authorization (EUA). This EUA will remain in effect (meaning this test can be used) for the duration of the COVID-19 declaration under Section 564(b)(1) of the Act, 21 U.S.C. section 360bbb-3(b)(1), unless the authorization is terminated or revoked.  Performed at [facility], [address] [place], [place]      Total time spend on discharging this patient, including the last patient exam, discussing the hospital stay, instructions for ongoing care as it relates to all pertinent caregivers, as well as preparing the medical discharge records, prescriptions, and/or referrals as applicable, is 40 minutes.    [id number], MD  [facility] 06/23/2020, 3:42 PM

## 2020-06-23 NOTE — Plan of Care (Signed)
?  Problem: Education: ?Goal: Knowledge of General Education information will improve ?Description: Including pain rating scale, medication(s)/side effects and non-pharmacologic comfort measures ?Outcome: Progressing ?  ?Problem: Health Behavior/Discharge Planning: ?Goal: Ability to manage health-related needs will improve ?Outcome: Progressing ?  ?Problem: Clinical Measurements: ?Goal: Ability to maintain clinical measurements within normal limits will improve ?Outcome: Progressing ?Goal: Will remain free from infection ?Outcome: Progressing ?Goal: Diagnostic test results will improve ?Outcome: Progressing ?Goal: Respiratory complications will improve ?Outcome: Progressing ?Goal: Cardiovascular complication will be avoided ?Outcome: Progressing ?  ?Problem: Coping: ?Goal: Level of anxiety will decrease ?Outcome: Progressing ?  ?Problem: Elimination: ?Goal: Will not experience complications related to bowel motility ?Outcome: Progressing ?Goal: Will not experience complications related to urinary retention ?Outcome: Progressing ?  ?Problem: Pain Managment: ?Goal: General experience of comfort will improve ?Outcome: Progressing ?  ?

## 2020-07-11 ENCOUNTER — Other Ambulatory Visit: Payer: Self-pay

## 2020-07-11 ENCOUNTER — Encounter: Payer: Self-pay | Admitting: Obstetrics and Gynecology

## 2020-07-11 ENCOUNTER — Ambulatory Visit (INDEPENDENT_AMBULATORY_CARE_PROVIDER_SITE_OTHER): Payer: Medicare Other | Admitting: Obstetrics and Gynecology

## 2020-07-11 VITALS — BP 128/72

## 2020-07-11 DIAGNOSIS — Z1239 Encounter for other screening for malignant neoplasm of breast: Secondary | ICD-10-CM | POA: Diagnosis not present

## 2020-07-11 NOTE — Progress Notes (Signed)
Gynecology Annual Exam  PCP: Keane Police, MD  Chief Complaint:  Chief Complaint  Patient presents with  . Gynecologic Exam    History of Present Illness: Patient is a 55 y.o. [id number] presents for annual exam. The patient has no complaints today.   LMP: No LMP recorded. Patient is postmenopausal. She denies postmenopausal bleeding.  The patient does perform self breast exams.  There is no notable family history of breast or ovarian cancer in her family.  The patient denies current symptoms of depression.   Review of Systems: Review of Systems  Constitutional: Negative for chills, fever, malaise/fatigue and weight loss.  HENT: Negative for congestion, hearing loss and sinus pain.   Eyes: Negative for blurred vision and double vision.  Respiratory: Negative for cough, sputum production, shortness of breath and wheezing.   Cardiovascular: Negative for chest pain, palpitations, orthopnea and leg swelling.  Gastrointestinal: Negative for abdominal pain, constipation, diarrhea, nausea and vomiting.  Genitourinary: Negative for dysuria, flank pain, frequency, hematuria and urgency.  Musculoskeletal: Negative for back pain, falls and joint pain.  Skin: Negative for itching and rash.  Neurological: Negative for dizziness and headaches.  Psychiatric/Behavioral: Negative for depression, substance abuse and suicidal ideas. The patient is not nervous/anxious.     Past Medical History:  Past Medical History:  Diagnosis Date  . Anxiety   . Depression   . Frequent headaches   . History of chicken pox   . Hypertension   . Stroke [facility])     Past Surgical History:  Past Surgical History:  Procedure Laterality Date  . CESAREAN SECTION    . FOOT SURGERY Left    bone spur  . LOOP RECORDER INSERTION N/A 09/04/2017   Procedure: LOOP RECORDER INSERTION;  Surgeon: Marcina Millard, MD;  Location: [facility] INVASIVE CV LAB;  Service: Cardiovascular;  Laterality: N/A;     Gynecologic History:  No LMP recorded. Patient is postmenopausal.   Obstetric History: [id number]  Family History:  Family History  Problem Relation Age of Onset  . Heart disease Father   . Diabetes Paternal Grandmother   . Heart disease Paternal Grandfather     Social History:  Social History   Socioeconomic History  . Marital status: Single    Spouse name: Not on file  . Number of children: Not on file  . Years of education: Not on file  . Highest education level: Not on file  Occupational History  . Not on file  Tobacco Use  . Smoking status: Current Every Day Smoker    Packs/day: 0.50  . Smokeless tobacco: Never Used  Vaping Use  . Vaping Use: Every day  Substance and Sexual Activity  . Alcohol use: Yes    Comment: occasional  . Drug use: No  . Sexual activity: Not Currently    Birth control/protection: Post-menopausal  Other Topics Concern  . Not on file  Social History Narrative  . Not on file   Social Determinants of Health   Financial Resource Strain: Not on file  Food Insecurity: Not on file  Transportation Needs: Not on file  Physical Activity: Not on file  Stress: Not on file  Social Connections: Not on file  Intimate Partner Violence: Not on file    Allergies:  Allergies  Allergen Reactions  . Erythromycin Nausea And Vomiting  . Septra [Sulfamethoxazole-Trimethoprim] Hives  . Sulfamethoxazole Other (See Comments)  . Trimethoprim Other (See Comments)    Medications: Prior to Admission medications   Medication Sig Start Date  End Date Taking? Authorizing Provider  acetaminophen (TYLENOL) 325 MG tablet Take by mouth.   Yes [provider]  acyclovir ointment (ZOVIRAX) 5 % Apply topically. 04/12/20  Yes [provider]  aspirin EC 325 MG EC tablet Take 1 tablet (325 mg total) by mouth daily. 05/15/17  Yes Houston Siren, MD  atorvastatin (LIPITOR) 80 MG tablet Take 1 tablet (80 mg total) by mouth daily at 6 PM. 05/16/17   Yes Sainani, Rolly Pancake, MD  Baclofen 5 MG TABS Take 5 mg by mouth at bedtime. 06/16/20  Yes [provider]  cholecalciferol (VITAMIN D) 25 MCG (1000 UNIT) tablet Take 1,000 Units by mouth daily. 02/01/20  Yes [provider]  Cholecalciferol 5000 units capsule Take 1 capsule (5,000 Units total) by mouth daily. 03/20/17  Yes Emi Belfast, FNP  clonazePAM (KLONOPIN) 0.5 MG tablet Take 1 tablet (0.5 mg total) by mouth 2 (two) times daily as needed for anxiety. 03/20/17  Yes Emi Belfast, FNP  cyanocobalamin (,VITAMIN B-12,) 1000 MCG/ML injection  02/19/18  Yes [provider]  escitalopram (LEXAPRO) 10 MG tablet Take 1 tablet (10 mg total) by mouth daily. 03/20/17  Yes Emi Belfast, FNP  feeding supplement, ENSURE ENLIVE, (ENSURE ENLIVE) LIQD Take 237 mLs by mouth 2 (two) times daily between meals. 05/14/17  Yes Sainani, Rolly Pancake, MD  Fremanezumab-vfrm (AJOVY) 225 MG/1.5ML SOAJ Inject into the skin. 06/22/19  Yes [provider]  guaifenesin (ROBITUSSIN) 100 MG/5ML syrup Take by mouth.   Yes [provider]  hydrocortisone cream 1 % Apply 1 application topically as directed. 04/05/20  Yes [provider]  Lactobacillus (ACIDOPHILUS) CAPS capsule Take by mouth.   Yes [provider]  levofloxacin (LEVAQUIN) 250 MG tablet Check your outpatient doctor to see if you still need to take this antibiotic. 06/23/20  Yes Darlin Priestly, MD  levothyroxine (SYNTHROID, LEVOTHROID) 25 MCG tablet Take 25 mcg by mouth daily before breakfast.   Yes [provider]  lisinopril (ZESTRIL) 10 MG tablet Hold until outpatient followup due to intermittent low blood pressure. 06/23/20  Yes Darlin Priestly, MD  Magnesium 250 MG TABS Take by mouth.   Yes [provider]  Melatonin 3 MG TABS Take by mouth.   Yes [provider]  nortriptyline (PAMELOR) 50 MG capsule Take by mouth. 11/06/18  Yes [provider]  oxycodone (OXY-IR) 5 MG  capsule Take by mouth.   Yes [provider]  pantoprazole (PROTONIX) 20 MG tablet  06/20/17  Yes [provider]  pregabalin (LYRICA) 75 MG capsule Take by mouth. 06/23/19  Yes [provider]  senna (SENOKOT) 8.6 MG tablet Take by mouth.   Yes [provider]  sodium chloride 1 g tablet Take by mouth.   Yes [provider]  thiamine 100 MG tablet Take by mouth.   Yes [provider]  torsemide (DEMADEX) 10 MG tablet Hold until outpatient followup due to intermittent low blood pressure. 06/23/20  Yes Darlin Priestly, MD  traZODone (DESYREL) 50 MG tablet Take by mouth. 02/20/18  Yes [provider]  triamcinolone cream (KENALOG) 0.1 % Apply topically.   Yes [provider]  venlafaxine (EFFEXOR) 75 MG tablet Take by mouth. 11/06/18  Yes [provider]  VYVANSE 30 MG capsule Take 30 mg by mouth at bedtime. 06/20/20  Yes [provider]  Wheat Dextrin (BENEFIBER) POWD Take by mouth. 06/20/17  Yes [provider]  potassium chloride (KLOR-CON) 20 MEQ  packet Take 40 mEq by mouth daily for 7 days. 06/23/20 06/30/20  Darlin Priestly, MD    Physical Exam Vitals: Blood pressure 128/72.  Physical Exam Constitutional:      Appearance: Normal appearance. She is well-developed.  Genitourinary:     Genitourinary Comments: breast equal without skin changes, nipple discharge, breast lump or enlarged lymph nodes   HENT:     Head: Normocephalic and atraumatic.  Eyes:     Extraocular Movements: Extraocular movements intact.     Pupils: Pupils are equal, round, and reactive to light.  Neck:     Thyroid: No thyromegaly.  Cardiovascular:     Rate and Rhythm: Normal rate and regular rhythm.     Heart sounds: Normal heart sounds.  Pulmonary:     Effort: Pulmonary effort is normal.     Breath sounds: Normal breath sounds.  Abdominal:     General: Bowel sounds are normal. There is no distension.     Palpations: Abdomen is  soft. There is no mass.  Musculoskeletal:     Cervical back: Neck supple.  Neurological:     Mental Status: She is alert and oriented to person, place, and time.  Skin:    General: Skin is warm and dry.  Psychiatric:        Behavior: Behavior normal.        Thought Content: Thought content normal.        Judgment: Judgment normal.  Vitals reviewed.    Unable to perform pelvic exam because of patient;s significantly limited mobility.    Female chaperone present for pelvic and breast  portions of the physical exam  Assessment: 55 y.o. G2P2002 routine annual exam  Plan: Problem List Items Addressed This Visit   None   Visit Diagnoses    Encounter for screening breast examination    -  Primary      1) Mammogram - recommend yearly screening mammogram.  Mammogram can not be performed because of her heart implant, clinical exam normal today.   2) STI screening was offered and declined  3) ASCCP guidelines and rational discussed.  Patient opts for every 5 years screening interval  4) Contraception - not needed not sexually active.   5) Colonoscopy -- discussed, patient declines.  6) Routine healthcare maintenance including cholesterol, diabetes screening discussed managed by PCP   More than 20 minutes were spent face to face with the patient in the room, reviewing the medical record, labs and images, and coordinating care for the patient. The plan of management was discussed in detail and counseling was provided.    Adelene Idler MD, [facility], [facility] 07/11/2020 11:19 AM

## 2021-05-16 ENCOUNTER — Other Ambulatory Visit: Payer: Self-pay

## 2021-05-16 ENCOUNTER — Inpatient Hospital Stay
Admission: EM | Admit: 2021-05-16 | Discharge: 2021-05-31 | DRG: 871 | Disposition: E | Payer: Medicare Other | Source: Skilled Nursing Facility | Attending: Pulmonary Disease | Admitting: Pulmonary Disease

## 2021-05-16 ENCOUNTER — Emergency Department: Payer: Medicare Other

## 2021-05-16 ENCOUNTER — Encounter: Payer: Self-pay | Admitting: Radiology

## 2021-05-16 DIAGNOSIS — J9602 Acute respiratory failure with hypercapnia: Secondary | ICD-10-CM | POA: Diagnosis present

## 2021-05-16 DIAGNOSIS — J9601 Acute respiratory failure with hypoxia: Secondary | ICD-10-CM | POA: Diagnosis present

## 2021-05-16 DIAGNOSIS — E785 Hyperlipidemia, unspecified: Secondary | ICD-10-CM | POA: Diagnosis present

## 2021-05-16 DIAGNOSIS — R570 Cardiogenic shock: Secondary | ICD-10-CM | POA: Diagnosis present

## 2021-05-16 DIAGNOSIS — Z833 Family history of diabetes mellitus: Secondary | ICD-10-CM

## 2021-05-16 DIAGNOSIS — D72829 Elevated white blood cell count, unspecified: Secondary | ICD-10-CM

## 2021-05-16 DIAGNOSIS — G928 Other toxic encephalopathy: Secondary | ICD-10-CM | POA: Diagnosis present

## 2021-05-16 DIAGNOSIS — Z66 Do not resuscitate: Secondary | ICD-10-CM | POA: Diagnosis present

## 2021-05-16 DIAGNOSIS — E872 Acidosis, unspecified: Secondary | ICD-10-CM | POA: Diagnosis present

## 2021-05-16 DIAGNOSIS — N179 Acute kidney failure, unspecified: Secondary | ICD-10-CM | POA: Diagnosis present

## 2021-05-16 DIAGNOSIS — A414 Sepsis due to anaerobes: Secondary | ICD-10-CM | POA: Diagnosis present

## 2021-05-16 DIAGNOSIS — K529 Noninfective gastroenteritis and colitis, unspecified: Secondary | ICD-10-CM

## 2021-05-16 DIAGNOSIS — Z7982 Long term (current) use of aspirin: Secondary | ICD-10-CM

## 2021-05-16 DIAGNOSIS — K219 Gastro-esophageal reflux disease without esophagitis: Secondary | ICD-10-CM | POA: Diagnosis present

## 2021-05-16 DIAGNOSIS — R6521 Severe sepsis with septic shock: Secondary | ICD-10-CM | POA: Diagnosis present

## 2021-05-16 DIAGNOSIS — K5100 Ulcerative (chronic) pancolitis without complications: Secondary | ICD-10-CM | POA: Diagnosis not present

## 2021-05-16 DIAGNOSIS — F418 Other specified anxiety disorders: Secondary | ICD-10-CM | POA: Diagnosis present

## 2021-05-16 DIAGNOSIS — I69322 Dysarthria following cerebral infarction: Secondary | ICD-10-CM | POA: Diagnosis not present

## 2021-05-16 DIAGNOSIS — E871 Hypo-osmolality and hyponatremia: Secondary | ICD-10-CM | POA: Diagnosis present

## 2021-05-16 DIAGNOSIS — Z515 Encounter for palliative care: Secondary | ICD-10-CM

## 2021-05-16 DIAGNOSIS — I959 Hypotension, unspecified: Secondary | ICD-10-CM | POA: Diagnosis not present

## 2021-05-16 DIAGNOSIS — E876 Hypokalemia: Secondary | ICD-10-CM | POA: Diagnosis present

## 2021-05-16 DIAGNOSIS — Z79899 Other long term (current) drug therapy: Secondary | ICD-10-CM

## 2021-05-16 DIAGNOSIS — R000 Tachycardia, unspecified: Secondary | ICD-10-CM

## 2021-05-16 DIAGNOSIS — Z79890 Hormone replacement therapy (postmenopausal): Secondary | ICD-10-CM

## 2021-05-16 DIAGNOSIS — A0472 Enterocolitis due to Clostridium difficile, not specified as recurrent: Secondary | ICD-10-CM | POA: Diagnosis present

## 2021-05-16 DIAGNOSIS — R739 Hyperglycemia, unspecified: Secondary | ICD-10-CM | POA: Diagnosis present

## 2021-05-16 DIAGNOSIS — I69354 Hemiplegia and hemiparesis following cerebral infarction affecting left non-dominant side: Secondary | ICD-10-CM

## 2021-05-16 DIAGNOSIS — Z8616 Personal history of COVID-19: Secondary | ICD-10-CM

## 2021-05-16 DIAGNOSIS — N390 Urinary tract infection, site not specified: Secondary | ICD-10-CM | POA: Diagnosis present

## 2021-05-16 DIAGNOSIS — I10 Essential (primary) hypertension: Secondary | ICD-10-CM | POA: Diagnosis present

## 2021-05-16 DIAGNOSIS — D72823 Leukemoid reaction: Secondary | ICD-10-CM | POA: Diagnosis present

## 2021-05-16 DIAGNOSIS — F32A Depression, unspecified: Secondary | ICD-10-CM | POA: Diagnosis not present

## 2021-05-16 DIAGNOSIS — R7989 Other specified abnormal findings of blood chemistry: Secondary | ICD-10-CM | POA: Diagnosis not present

## 2021-05-16 DIAGNOSIS — E039 Hypothyroidism, unspecified: Secondary | ICD-10-CM | POA: Diagnosis present

## 2021-05-16 DIAGNOSIS — E878 Other disorders of electrolyte and fluid balance, not elsewhere classified: Secondary | ICD-10-CM | POA: Diagnosis present

## 2021-05-16 DIAGNOSIS — F17290 Nicotine dependence, other tobacco product, uncomplicated: Secondary | ICD-10-CM | POA: Diagnosis present

## 2021-05-16 DIAGNOSIS — A419 Sepsis, unspecified organism: Secondary | ICD-10-CM | POA: Diagnosis not present

## 2021-05-16 DIAGNOSIS — E860 Dehydration: Secondary | ICD-10-CM | POA: Diagnosis present

## 2021-05-16 DIAGNOSIS — Z8249 Family history of ischemic heart disease and other diseases of the circulatory system: Secondary | ICD-10-CM | POA: Diagnosis not present

## 2021-05-16 DIAGNOSIS — G9340 Encephalopathy, unspecified: Secondary | ICD-10-CM | POA: Diagnosis not present

## 2021-05-16 DIAGNOSIS — Z452 Encounter for adjustment and management of vascular access device: Secondary | ICD-10-CM

## 2021-05-16 DIAGNOSIS — F419 Anxiety disorder, unspecified: Secondary | ICD-10-CM | POA: Diagnosis not present

## 2021-05-16 LAB — COMPREHENSIVE METABOLIC PANEL
ALT: 27 U/L (ref 0–44)
AST: 48 U/L — ABNORMAL HIGH (ref 15–41)
Albumin: 2.7 g/dL — ABNORMAL LOW (ref 3.5–5.0)
Alkaline Phosphatase: 213 U/L — ABNORMAL HIGH (ref 38–126)
Anion gap: 16 — ABNORMAL HIGH (ref 5–15)
BUN: 16 mg/dL (ref 6–20)
CO2: 22 mmol/L (ref 22–32)
Calcium: 8.5 mg/dL — ABNORMAL LOW (ref 8.9–10.3)
Chloride: 96 mmol/L — ABNORMAL LOW (ref 98–111)
Creatinine, Ser: UNDETERMINED mg/dL (ref 0.44–1.00)
Glucose, Bld: 100 mg/dL — ABNORMAL HIGH (ref 70–99)
Potassium: 3.2 mmol/L — ABNORMAL LOW (ref 3.5–5.1)
Sodium: 134 mmol/L — ABNORMAL LOW (ref 135–145)
Total Bilirubin: 0.9 mg/dL (ref 0.3–1.2)
Total Protein: 7 g/dL (ref 6.5–8.1)

## 2021-05-16 LAB — URINALYSIS, COMPLETE (UACMP) WITH MICROSCOPIC
Bilirubin Urine: NEGATIVE
Glucose, UA: NEGATIVE mg/dL
Hgb urine dipstick: NEGATIVE
Ketones, ur: NEGATIVE mg/dL
Nitrite: NEGATIVE
Protein, ur: 100 mg/dL — AB
Specific Gravity, Urine: 1.025 (ref 1.005–1.030)
pH: 5 (ref 5.0–8.0)

## 2021-05-16 LAB — CBC WITH DIFFERENTIAL/PLATELET
Abs Immature Granulocytes: 0 10*3/uL (ref 0.00–0.07)
Basophils Absolute: 0 10*3/uL (ref 0.0–0.1)
Basophils Relative: 0 %
Eosinophils Absolute: 0 10*3/uL (ref 0.0–0.5)
Eosinophils Relative: 0 %
HCT: 46.9 % — ABNORMAL HIGH (ref 36.0–46.0)
Hemoglobin: 15.6 g/dL — ABNORMAL HIGH (ref 12.0–15.0)
Lymphocytes Relative: 4 %
Lymphs Abs: 1.4 10*3/uL (ref 0.7–4.0)
MCH: 31.8 pg (ref 26.0–34.0)
MCHC: 33.3 g/dL (ref 30.0–36.0)
MCV: 95.7 fL (ref 80.0–100.0)
Monocytes Absolute: 3.2 10*3/uL — ABNORMAL HIGH (ref 0.1–1.0)
Monocytes Relative: 9 %
Neutro Abs: 31 10*3/uL — ABNORMAL HIGH (ref 1.7–7.7)
Neutrophils Relative %: 87 %
Platelets: 411 10*3/uL — ABNORMAL HIGH (ref 150–400)
RBC: 4.9 MIL/uL (ref 3.87–5.11)
RDW: 14.2 % (ref 11.5–15.5)
Smear Review: NORMAL
WBC: 35.6 10*3/uL — ABNORMAL HIGH (ref 4.0–10.5)
nRBC: 0 % (ref 0.0–0.2)

## 2021-05-16 LAB — RESP PANEL BY RT-PCR (FLU A&B, COVID) ARPGX2
Influenza A by PCR: NEGATIVE
Influenza B by PCR: NEGATIVE
SARS Coronavirus 2 by RT PCR: NEGATIVE

## 2021-05-16 LAB — LACTIC ACID, PLASMA
Lactic Acid, Venous: 4 mmol/L (ref 0.5–1.9)
Lactic Acid, Venous: 3.2 mmol/L (ref 0.5–1.9)

## 2021-05-16 LAB — CREATININE, SERUM
Creatinine, Ser: 0.74 mg/dL (ref 0.44–1.00)
GFR, Estimated: >60 mL/min (ref >60)

## 2021-05-16 LAB — CBG MONITORING, ED: Glucose-Capillary: 123 mg/dL — ABNORMAL HIGH (ref 70–99)

## 2021-05-16 LAB — PROTIME-INR
INR: 1.4 — ABNORMAL HIGH (ref 0.8–1.2)
Prothrombin Time: 16.8 seconds — ABNORMAL HIGH (ref 11.4–15.2)

## 2021-05-16 LAB — TROPONIN I (HIGH SENSITIVITY)
Troponin I (High Sensitivity): 29 ng/L — ABNORMAL HIGH (ref <18)
Troponin I (High Sensitivity): 19 ng/L — ABNORMAL HIGH (ref <18)

## 2021-05-16 LAB — APTT: aPTT: 36 seconds (ref 24–36)

## 2021-05-16 MED ORDER — IOHEXOL 300 MG/ML  SOLN
100.0000 mL | Freq: Once | INTRAMUSCULAR | Status: AC | PRN
  Administered 2021-05-16: 100 mL via INTRAVENOUS

## 2021-05-16 MED ORDER — LEVOTHYROXINE SODIUM 50 MCG PO TABS
25.0000 ug | ORAL_TABLET | Freq: Every day | ORAL | Status: DC
  Administered 2021-05-17 – 2021-05-18 (×2): 25 ug via ORAL
  Filled 2021-05-16 (×2): qty 1

## 2021-05-16 MED ORDER — PREGABALIN 75 MG PO CAPS
75.0000 mg | ORAL_CAPSULE | Freq: Every day | ORAL | Status: DC
  Administered 2021-05-17 – 2021-05-18 (×3): 75 mg via ORAL
  Filled 2021-05-16 (×3): qty 1

## 2021-05-16 MED ORDER — BACLOFEN 10 MG PO TABS
5.0000 mg | ORAL_TABLET | Freq: Every day | ORAL | Status: DC
  Administered 2021-05-17 – 2021-05-18 (×3): 5 mg via ORAL
  Filled 2021-05-16 (×5): qty 0.5

## 2021-05-16 MED ORDER — VITAMIN D3 25 MCG (1000 UNIT) PO TABS
1000.0000 [IU] | ORAL_TABLET | Freq: Every day | ORAL | Status: DC
  Administered 2021-05-17 – 2021-05-18 (×2): 1000 [IU] via ORAL
  Filled 2021-05-16 (×5): qty 1

## 2021-05-16 MED ORDER — CHOLECALCIFEROL 125 MCG (5000 UT) PO CAPS: 5000.0000 [IU] | ORAL_CAPSULE | Freq: Every day | ORAL | Status: DC

## 2021-05-16 MED ORDER — SODIUM CHLORIDE 0.9 % IV SOLN
2.0000 g | Freq: Three times a day (TID) | INTRAVENOUS | Status: DC
  Administered 2021-05-17: 2 g via INTRAVENOUS
  Filled 2021-05-16 (×5): qty 2

## 2021-05-16 MED ORDER — PANTOPRAZOLE SODIUM 40 MG PO TBEC
40.0000 mg | DELAYED_RELEASE_TABLET | Freq: Every day | ORAL | Status: DC
  Administered 2021-05-17 – 2021-05-18 (×3): 40 mg via ORAL
  Filled 2021-05-16 (×3): qty 1

## 2021-05-16 MED ORDER — POTASSIUM CHLORIDE 10 MEQ/100ML IV SOLN
10.0000 meq | INTRAVENOUS | Status: AC
  Administered 2021-05-17 (×2): 10 meq via INTRAVENOUS
  Filled 2021-05-16: qty 100

## 2021-05-16 MED ORDER — POTASSIUM CHLORIDE IN NACL 40-0.9 MEQ/L-% IV SOLN
INTRAVENOUS | Status: DC
  Filled 2021-05-16 (×3): qty 1000

## 2021-05-16 MED ORDER — ONDANSETRON HCL 4 MG PO TABS: 4.0000 mg | ORAL_TABLET | Freq: Four times a day (QID) | ORAL | Status: DC | PRN

## 2021-05-16 MED ORDER — ACETAMINOPHEN 325 MG PO TABS: 650.0000 mg | ORAL_TABLET | Freq: Four times a day (QID) | ORAL | Status: DC | PRN

## 2021-05-16 MED ORDER — MORPHINE SULFATE (PF) 2 MG/ML IV SOLN
2.0000 mg | INTRAVENOUS | Status: DC | PRN
  Administered 2021-05-17: 2 mg via INTRAVENOUS
  Filled 2021-05-16: qty 1

## 2021-05-16 MED ORDER — VANCOMYCIN HCL IN DEXTROSE 1-5 GM/200ML-% IV SOLN: 1000.0000 mg | INTRAVENOUS | Status: DC

## 2021-05-16 MED ORDER — METRONIDAZOLE 500 MG/100ML IV SOLN
500.0000 mg | Freq: Once | INTRAVENOUS | Status: AC
  Administered 2021-05-16: 500 mg via INTRAVENOUS
  Filled 2021-05-16: qty 100

## 2021-05-16 MED ORDER — THIAMINE HCL 100 MG PO TABS
100.0000 mg | ORAL_TABLET | Freq: Every day | ORAL | Status: DC
  Administered 2021-05-17 – 2021-05-18 (×3): 100 mg via ORAL
  Filled 2021-05-16 (×3): qty 1

## 2021-05-16 MED ORDER — ENOXAPARIN SODIUM 40 MG/0.4ML IJ SOSY
40.0000 mg | PREFILLED_SYRINGE | INTRAMUSCULAR | Status: DC
  Administered 2021-05-17 – 2021-05-18 (×3): 40 mg via SUBCUTANEOUS
  Filled 2021-05-16 (×3): qty 0.4

## 2021-05-16 MED ORDER — SODIUM CHLORIDE 0.9 % IV SOLN: 2.0000 g | Freq: Once | INTRAVENOUS | Status: DC

## 2021-05-16 MED ORDER — TRAZODONE HCL 50 MG PO TABS: 25.0000 mg | ORAL_TABLET | Freq: Every evening | ORAL | Status: DC | PRN

## 2021-05-16 MED ORDER — LACTATED RINGERS IV BOLUS
2000.0000 mL | Freq: Once | INTRAVENOUS | Status: AC
  Administered 2021-05-16: 2000 mL via INTRAVENOUS

## 2021-05-16 MED ORDER — ACETAMINOPHEN 650 MG RE SUPP: 650.0000 mg | Freq: Four times a day (QID) | RECTAL | Status: DC | PRN

## 2021-05-16 MED ORDER — VENLAFAXINE HCL 37.5 MG PO TABS
75.0000 mg | ORAL_TABLET | Freq: Every day | ORAL | Status: DC
  Filled 2021-05-16: qty 2

## 2021-05-16 MED ORDER — LISINOPRIL 10 MG PO TABS: 10.0000 mg | ORAL_TABLET | Freq: Every day | ORAL | Status: DC

## 2021-05-16 MED ORDER — TRAZODONE HCL 50 MG PO TABS
50.0000 mg | ORAL_TABLET | Freq: Every evening | ORAL | Status: DC | PRN
  Administered 2021-05-17: 50 mg via ORAL
  Filled 2021-05-16 (×2): qty 1

## 2021-05-16 MED ORDER — LISDEXAMFETAMINE DIMESYLATE 30 MG PO CAPS
30.0000 mg | ORAL_CAPSULE | Freq: Every day | ORAL | Status: DC
  Administered 2021-05-18: 30 mg via ORAL

## 2021-05-16 MED ORDER — CLONAZEPAM 0.5 MG PO TABS
0.5000 mg | ORAL_TABLET | Freq: Two times a day (BID) | ORAL | Status: DC | PRN
  Administered 2021-05-17 (×2): 0.5 mg via ORAL
  Filled 2021-05-16 (×2): qty 1

## 2021-05-16 MED ORDER — SODIUM CHLORIDE 0.9 % IV SOLN
2.0000 g | Freq: Once | INTRAVENOUS | Status: AC
  Administered 2021-05-16: 2 g via INTRAVENOUS
  Filled 2021-05-16: qty 2

## 2021-05-16 MED ORDER — ATORVASTATIN CALCIUM 20 MG PO TABS
80.0000 mg | ORAL_TABLET | Freq: Every day | ORAL | Status: DC
  Administered 2021-05-17 – 2021-05-18 (×2): 80 mg via ORAL
  Filled 2021-05-16: qty 4
  Filled 2021-05-16: qty 1

## 2021-05-16 MED ORDER — ESCITALOPRAM OXALATE 10 MG PO TABS
10.0000 mg | ORAL_TABLET | Freq: Every day | ORAL | Status: DC
  Administered 2021-05-17: 10 mg via ORAL
  Filled 2021-05-16: qty 1

## 2021-05-16 MED ORDER — MELATONIN 5 MG PO TABS
5.0000 mg | ORAL_TABLET | Freq: Every evening | ORAL | Status: DC | PRN
  Administered 2021-05-17: 5 mg via ORAL
  Filled 2021-05-16: qty 1

## 2021-05-16 MED ORDER — SENNA 8.6 MG PO TABS: 1.0000 | ORAL_TABLET | Freq: Every evening | ORAL | Status: DC | PRN

## 2021-05-16 MED ORDER — METRONIDAZOLE 500 MG/100ML IV SOLN
500.0000 mg | Freq: Two times a day (BID) | INTRAVENOUS | Status: DC
  Administered 2021-05-17: 500 mg via INTRAVENOUS
  Filled 2021-05-16 (×2): qty 100

## 2021-05-16 MED ORDER — ONDANSETRON HCL 4 MG/2ML IJ SOLN
4.0000 mg | Freq: Four times a day (QID) | INTRAMUSCULAR | Status: DC | PRN
  Administered 2021-05-17 (×2): 4 mg via INTRAVENOUS
  Filled 2021-05-16 (×2): qty 2

## 2021-05-16 MED ORDER — LACTATED RINGERS IV SOLN: INTRAVENOUS | Status: DC

## 2021-05-16 MED ORDER — VANCOMYCIN HCL IN DEXTROSE 1-5 GM/200ML-% IV SOLN: 1000.0000 mg | Freq: Once | INTRAVENOUS | Status: DC

## 2021-05-16 MED ORDER — VANCOMYCIN HCL 2000 MG/400ML IV SOLN
2000.0000 mg | Freq: Once | INTRAVENOUS | Status: AC
  Administered 2021-05-16: 2000 mg via INTRAVENOUS
  Filled 2021-05-16: qty 400

## 2021-05-16 MED ORDER — POTASSIUM CHLORIDE 20 MEQ PO PACK
40.0000 meq | PACK | Freq: Every day | ORAL | Status: DC
  Administered 2021-05-17 – 2021-05-18 (×3): 40 meq via ORAL
  Filled 2021-05-16 (×3): qty 2

## 2021-05-16 MED ORDER — RISAQUAD PO CAPS
1.0000 | ORAL_CAPSULE | Freq: Every day | ORAL | Status: DC
  Administered 2021-05-17 – 2021-05-19 (×3): 1 via ORAL
  Filled 2021-05-16 (×3): qty 1

## 2021-05-16 MED ORDER — ASPIRIN EC 325 MG PO TBEC
325.0000 mg | DELAYED_RELEASE_TABLET | Freq: Every day | ORAL | Status: DC
  Administered 2021-05-17 – 2021-05-18 (×3): 325 mg via ORAL
  Filled 2021-05-16 (×3): qty 1

## 2021-05-16 MED ORDER — ENSURE ENLIVE PO LIQD
237.0000 mL | Freq: Two times a day (BID) | ORAL | Status: DC
  Administered 2021-05-18: 237 mL via ORAL

## 2021-05-16 NOTE — ED Notes (Signed)
Report received from Jacquelyn, RN °

## 2021-05-16 NOTE — Sepsis Progress Note (Signed)
eLink is monitoring this Code Sepsis. °

## 2021-05-16 NOTE — ED Notes (Signed)
Dr. Mansy at the bedside. 

## 2021-05-16 NOTE — H&P (Addendum)
[name]   PATIENT NAME: Terri Bender    MR#:  [id number]  DATE OF BIRTH:  1966-04-29  DATE OF ADMISSION:  05/06/2021  PRIMARY CARE PHYSICIAN: Alvester Morin, MD   Patient is coming from: SNF.  REQUESTING/REFERRING PHYSICIAN: Rada Hay, MD  CHIEF COMPLAINT:   Chief Complaint  Patient presents with   Altered Mental Status  -Abdominal pain, fever  HISTORY OF PRESENT ILLNESS:  Terri Bender is a 57 y.o. female with medical history significant for anxiety, depression, headache and CVA with hemiplegia and dysarthria who presented to the ER with acute onset of fever and altered mental status with associated abdominal pain.  She has been having left upper and lower quadrant abdominal pain over the last 3 to 4 weeks on an intermittent basis.  She has been having nausea and vomiting recently with associated watery diarrhea without blood or melena.  She denied having mild urinary frequency and urgency without dysuria or hematuria or flank pain.  No chest pain or palpitations or cough or wheezing or hemoptysis..  Temperature was 100.2 at her SNF with associated malaise.  No nausea or vomiting or abdominal pain.  No chest pain or dyspnea or cough or wheezing.   She has been having right shoulder pain.  ED Course: Upon presentation to the emergency room temperature was 100.2 after taking Tylenol at home with heart rate of 121 and otherwise normal vital signs.  Labs revealed mild hypokalemia of 3.2 and hyponatremia of 134 and hypochloremia of 96.  Alk phos was 213 and anion gap 16.  Albumin 2.7 AST 48 and ALT 27.  High-sensitivity troponin I was 29 later 19.  Lactic acid was 4 and later 3.2.  CBC showed significant leukocytosis at 35.6 with neutrophilia.  Pathological blood smear is pending.  INR was 1.4 and PT 16.8 UA showed many bacteria and 21-50 WBCs with trace leukocytes and 100 protein.  Urine culture as well as blood cultures were sent.  Influenza antigens and COVID-19  PCR came back negative. EKG as reviewed by me : EKG showed sinus tachycardia with a rate 120 with left posterior fascicular block with Q waves inferiorly and in V1. Imaging: Noncontrasted head CT scan revealed no acute intracranial abnormality. Abdominal pelvic CT scan revealed the following: Pancolitis with differential diagnosis including infection, inflammation and less likely ischemia with recommendation to consider colonoscopy after treatment to exclude underlying lesion.  It showed subcutaneous soft tissue emphysema within the right lower anterior abdominal wall related to medication injection.  Portable chest x-ray showed low lung volumes with hypoventilatory changes.  The patient was given IV vancomycin, cefepime and Flagyl as well as 2 L bolus of IV lactated Ringer and 10 mill equivalent IV potassium chloride.  She will be admitted to a progressive unit bed for further evaluation and management. PAST MEDICAL HISTORY:   Past Medical History:  Diagnosis Date   Anxiety    Depression    Frequent headaches    History of chicken pox    Hypertension    Stroke ([place])     PAST SURGICAL HISTORY:   Past Surgical History:  Procedure Laterality Date   CESAREAN SECTION     FOOT SURGERY Left    bone spur   LOOP RECORDER INSERTION N/A 09/04/2017   Procedure: LOOP RECORDER INSERTION;  Surgeon: Isaias Cowman, MD;  Location: [facility];  Service: Cardiovascular;  Laterality: N/A;    SOCIAL HISTORY:   Social History  Tobacco Use   Smoking status: Every Day    Packs/day: 0.50    Types: Cigarettes   Smokeless tobacco: Never  Substance Use Topics   Alcohol use: Yes    Comment: occasional    FAMILY HISTORY:   Family History  Problem Relation Age of Onset   Heart disease Father    Diabetes Paternal Grandmother    Heart disease Paternal Grandfather     DRUG ALLERGIES:   Allergies  Allergen Reactions   Erythromycin Nausea And Vomiting   Septra  [Sulfamethoxazole-Trimethoprim] Hives   Sulfamethoxazole Other (See Comments)   Trimethoprim Other (See Comments)    REVIEW OF SYSTEMS:   ROS As per history of present illness. All pertinent systems were reviewed above. Constitutional, HEENT, cardiovascular, respiratory, GI, GU, musculoskeletal, neuro, psychiatric, endocrine, integumentary and hematologic systems were reviewed and are otherwise negative/unremarkable except for positive findings mentioned above in the HPI.   MEDICATIONS AT HOME:   Prior to Admission medications   Medication Sig Start Date End Date Taking? Authorizing Provider  acetaminophen (TYLENOL) 325 MG tablet Take by mouth.    [provider]  acyclovir ointment (ZOVIRAX) 5 % Apply topically. 04/12/20   [provider]  aspirin EC 325 MG EC tablet Take 1 tablet (325 mg total) by mouth daily. 05/15/17   Henreitta Leber, MD  atorvastatin (LIPITOR) 80 MG tablet Take 1 tablet (80 mg total) by mouth daily at 6 PM. 05/16/17   Sainani, Belia Heman, MD  Baclofen 5 MG TABS Take 5 mg by mouth at bedtime. 06/16/20   [provider]  cholecalciferol (VITAMIN D) 25 MCG (1000 UNIT) tablet Take 1,000 Units by mouth daily. 02/01/20   [provider]  Cholecalciferol 5000 units capsule Take 1 capsule (5,000 Units total) by mouth daily. 03/20/17   Elby Beck, FNP  clonazePAM (KLONOPIN) 0.5 MG tablet Take 1 tablet (0.5 mg total) by mouth 2 (two) times daily as needed for anxiety. 03/20/17   Elby Beck, FNP  cyanocobalamin (,VITAMIN B-12,) 1000 MCG/ML injection  02/19/18   [provider]  escitalopram (LEXAPRO) 10 MG tablet Take 1 tablet (10 mg total) by mouth daily. 03/20/17   Elby Beck, FNP  feeding supplement, ENSURE ENLIVE, (ENSURE ENLIVE) LIQD Take 237 mLs by mouth 2 (two) times daily between meals. 05/14/17   Henreitta Leber, MD  Fremanezumab-vfrm (AJOVY) 225 MG/1.5ML SOAJ Inject into the skin. 06/22/19   [provider]  guaifenesin (ROBITUSSIN) 100 MG/5ML syrup Take by mouth.    [provider]  hydrocortisone cream 1 % Apply 1 application topically as directed. 04/05/20   [provider]  Lactobacillus (ACIDOPHILUS) CAPS capsule Take by mouth.    [provider]  levofloxacin (LEVAQUIN) 250 MG tablet Check your outpatient doctor to see if you still need to take this antibiotic. 06/23/20   Enzo Bi, MD  levothyroxine (SYNTHROID, LEVOTHROID) 25 MCG tablet Take 25 mcg by mouth daily before breakfast.    [provider]  lisinopril (ZESTRIL) 10 MG tablet Hold until outpatient followup due to intermittent low blood pressure. 06/23/20   Enzo Bi, MD  Magnesium 250 MG TABS Take by mouth.    [provider]  Melatonin 3 MG TABS Take by mouth.    [provider]  nortriptyline (PAMELOR) 50 MG capsule Take by mouth. 11/06/18   [provider]  oxycodone (OXY-IR) 5 MG capsule Take by mouth.    [provider]  pantoprazole (PROTONIX) 20  MG tablet  06/20/17   [provider]  potassium chloride (KLOR-CON) 20 MEQ packet Take 40 mEq by mouth daily for 7 days. 06/23/20 06/30/20  Enzo Bi, MD  pregabalin (LYRICA) 75 MG capsule Take by mouth. 06/23/19   [provider]  senna (SENOKOT) 8.6 MG tablet Take by mouth.    [provider]  sodium chloride 1 g tablet Take by mouth.    [provider]  thiamine 100 MG tablet Take by mouth.    [provider]  torsemide (DEMADEX) 10 MG tablet Hold until outpatient followup due to intermittent low blood pressure. 06/23/20   Enzo Bi, MD  traZODone (DESYREL) 50 MG tablet Take by mouth. 02/20/18   [provider]  triamcinolone cream (KENALOG) 0.1 % Apply topically.    [provider]  venlafaxine (EFFEXOR) 75 MG tablet Take by mouth. 11/06/18   [provider]  VYVANSE 30 MG capsule Take 30 mg by mouth at bedtime. 06/20/20   [provider]  Wheat Dextrin (BENEFIBER) POWD Take by mouth. 06/20/17   [provider]      VITAL SIGNS:  Blood pressure 104/68, pulse (!) 121, temperature 100.2 F (37.9 C), temperature source Oral, resp. rate 20, height _0  (1.575 m), weight 74.8 kg, SpO2 97 %.  PHYSICAL EXAMINATION:  Physical Exam  GENERAL:  56 y.o.-year-old female patient lying in the bed with no acute distress.  EYES: Pupils equal, round, reactive to light and accommodation. No scleral icterus. Extraocular muscles intact.  HEENT: Head atraumatic, normocephalic. Oropharynx and nasopharynx clear.  NECK:  Supple, no jugular venous distention. No thyroid enlargement, no tenderness.  LUNGS: Normal breath sounds bilaterally, no wheezing, rales,rhonchi or crepitation. No use of accessory muscles of respiration.  CARDIOVASCULAR: Regular rate and rhythm, S1, S2 normal. No murmurs, rubs, or gallops.  ABDOMEN: Soft, nondistended, epigastric, left upper and lower quadrant abdominal tenderness without rebound guarding or rigidity. Bowel sounds present. No organomegaly or mass.  EXTREMITIES: No pedal edema, cyanosis, or clubbing.  NEUROLOGIC: She has old left-sided hemiparesis. PSYCHIATRIC: The patient is alert and oriented x 3.  Normal affect and good eye contact. SKIN: No obvious rash, lesion, or ulcer.   LABORATORY PANEL:   CBC Recent Labs  Lab 05/24/2021 1647  WBC 35.6*  HGB 15.6*  HCT 46.9*  PLT 411*   ------------------------------------------------------------------------------------------------------------------  Chemistries  Recent Labs  Lab 05/15/2021 1647 05/27/2021 1829  NA 134*  --   K 3.2*  --   CL 96*  --   CO2 22  --   GLUCOSE 100*  --   BUN 16  --   CREATININE QUANTITY NOT SUFFICIENT, UNABLE TO PERFORM TEST 0.74  CALCIUM 8.5*  --   AST 48*  --   ALT 27  --   ALKPHOS 213*  --   BILITOT 0.9  --     ------------------------------------------------------------------------------------------------------------------  Cardiac Enzymes No results for input(s): TROPONINI in the last 168 hours. ------------------------------------------------------------------------------------------------------------------  RADIOLOGY:  CT HEAD WO CONTRAST (5MM)  Result Date: 05/25/2021 CLINICAL DATA:  Mental status change, unknown cause EXAM: CT HEAD WITHOUT CONTRAST TECHNIQUE: Contiguous axial images were obtained from the base of the skull through the vertex without intravenous contrast. RADIATION DOSE REDUCTION: This exam was performed according to the departmental dose-optimization program which includes automated exposure control, adjustment of the mA and/or kV according to patient size and/or use of iterative reconstruction technique. COMPARISON:  Ct head 06/22/20 BRAIN: BRAIN Cerebral ventricle sizes are concordant  with the degree of cerebral volume loss. Patchy and confluent areas of decreased attenuation are noted throughout the deep and periventricular white matter of the cerebral hemispheres bilaterally, compatible with chronic microvascular ischemic disease. Chronic large right middle cerebral artery distribution infarction. No evidence of large-territorial acute infarction. No parenchymal hemorrhage. No mass lesion. No extra-axial collection. No mass effect or midline shift. No hydrocephalus. Basilar cisterns are patent. Vascular: No hyperdense vessel. Skull: No acute fracture or focal lesion. Sinuses/Orbits: Paranasal sinuses and mastoid air cells are clear. The orbits are unremarkable. Other: None. IMPRESSION: No acute intracranial abnormality. Electronically Signed   By: Iven Finn M.D.   On: 05/30/2021 19:48   CT ABDOMEN PELVIS W CONTRAST  Result Date: 05/21/2021 CLINICAL DATA:  Abdominal pain, acute, nonlocalized EXAM: CT ABDOMEN AND PELVIS WITH CONTRAST TECHNIQUE: Multidetector CT imaging of the  abdomen and pelvis was performed using the standard protocol following bolus administration of intravenous contrast. RADIATION DOSE REDUCTION: This exam was performed according to the departmental dose-optimization program which includes automated exposure control, adjustment of the mA and/or kV according to patient size and/or use of iterative reconstruction technique. CONTRAST:  179m OMNIPAQUE IOHEXOL 300 MG/ML  SOLN COMPARISON:  None. FINDINGS: Lower chest: Bilateral lower lobe subsegmental atelectasis. Tiny hiatal hernia. Hepatobiliary: No focal liver abnormality. No gallstones, gallbladder wall thickening, or pericholecystic fluid. No biliary dilatation. Pancreas: Diffusely atrophic. No focal lesion. Otherwise normal pancreatic contour. No surrounding inflammatory changes. No main pancreatic ductal dilatation. Spleen: Normal in size without focal abnormality. A splenule is noted. Adrenals/Urinary Tract: No adrenal nodule bilaterally. Bilateral kidneys enhance symmetrically. No hydronephrosis. No hydroureter. The urinary bladder is unremarkable. Stomach/Bowel: Stomach is within normal limits. No evidence of small bowel wall thickening or dilatation. Diffuse large bowel wall thickening, mucosal hyperemia, pericolonic fat stranding. Appendix appears normal. Vascular/Lymphatic: No abdominal aorta or iliac aneurysm. No abdominal, pelvic, or inguinal lymphadenopathy. Reproductive: Uterus and bilateral adnexa are unremarkable. Other: Trace free fluid within the pelvis. No intraperitoneal free gas. No organized fluid collection. Musculoskeletal: Subcutaneus soft tissue emphysema within the right lower anterior abdominal wall likely related to an medication injection. No suspicious lytic or blastic osseous lesions. No acute displaced fracture. Multilevel degenerative changes of the spine. Old healed pelvic fractures. IMPRESSION: 1. Pancolitis. Differential diagnosis for etiology includes infection, inflammation, less  likely ischemia. Consider colonoscopy status post treatment and status post complete resolution of inflammatory changes to exclude an underlying lesion. 2. Subcutaneus soft tissue emphysema within the right lower anterior abdominal wall likely related to an medication injection. Recommend clinical correlation for infection. 3. Tiny hiatal hernia. Electronically Signed   By: MIven FinnM.D.   On: 05/15/2021 19:45   DG Chest Port 1 View  Result Date: 05/07/2021 CLINICAL DATA:  Concern for sepsis EXAM: PORTABLE CHEST 1 VIEW COMPARISON:  Chest x-ray dated June 22, 2020 FINDINGS: Cardiac and mediastinal contours are unchanged within normal limits. Low lung volumes with hypoventilatory changes. No focal consolidation. No large pleural effusion or evidence of pneumothorax. IMPRESSION: Low lung volumes with hypoventilatory changes. No definite focal consolidation. Electronically Signed   By: LYetta GlassmanM.D.   On: 04/30/2021 16:51      IMPRESSION AND PLAN:  Principal Problem:   Pancolitis ([place] Level  1.  Acute pancolitis with subsequent sepsis.  Sepsis manifested by tachycardia and significant leukocytosis.  The patient meets criteria for sepsis with a lactic acid of 4. - The patient will be admitted to a PCU bed. - We will continue antibiotic therapy  with IV vancomycin, Flagyl and cefepime. - We will await stool C. difficile and GI panel. - Pain management will be provided. - Should be hydrated with IV normal saline. - We will follow blood cultures.  2.  Suspected UTI could be contributing to sepsis. - This should be covered by above antibiotics. - We will follow urine culture.  3.  Hypokalemia. - We will replace potassium and check magnesium level.  4.  Hyponatremia and hypochloremia likely due to dehydration. - We will hydrate with IV normal saline and follow BMP.  5.  Elevated LFTs. - We will follow LFTs with hydration.  6.  Dyslipidemia. - We will resume statin therapy.  She  had no significant change in transaminases.  7.  Anxiety and depression. - We will continue Klonopin, Effexor ER and Lexapro.  8.  Hypothyroidism. - We will continue Synthroid.  9.  Hypertension. - We will continue Toprol-XL and lisinopril with holding parameters.  10.  GERD. - We will continue PPI therapy.  DVT prophylaxis: Lovenox. Code Status: full code. Family Communication:  The plan of care was discussed in details with the patient (and family). I answered all questions. The patient agreed to proceed with the above mentioned plan. Further management will depend upon hospital course. Disposition Plan: Back to previous home environment Consults called: none. All the records are reviewed and case discussed with ED provider.  Status is: Inpatient   At the time of the admission, it appears that the appropriate admission status for this patient is inpatient.  This is judged to be reasonable and necessary in order to provide the required intensity of service to ensure the patient's safety given the presenting symptoms, physical exam findings and initial radiographic and laboratory data in the context of comorbid conditions.  The patient requires inpatient status due to high intensity of service, high risk of further deterioration and high frequency of surveillance required.  I certify that at the time of admission, it is my clinical judgment that the patient will require inpatient hospital care extending more than 2 midnights.                            Dispo: The patient is from: SNF              Anticipated d/c is to: SNF              Patient currently is not medically stable to d/c.              Difficult to place patient: No  Christel Mormon M.D on 05/11/2021 at 8:44 PM  [facility]   From 7 PM-7 AM, contact night-coverage [email or web address]  CC: Primary care physician; Alvester Morin, MD

## 2021-05-16 NOTE — Consult Note (Signed)
CODE SEPSIS - PHARMACY COMMUNICATION  **Broad Spectrum Antibiotics should be administered within 1 hour of Sepsis diagnosis**  Time Code Sepsis Called/Page Received: 1/17 at 1635  Antibiotics Ordered: Metronidazole, Cefepime, Vancomycin  Time of 1st antibiotic administration: 1756  Additional action taken by pharmacy: No antibiotics ordered. Contacted MD for plan of care. MD was trying to get sense of source.   If necessary, Name of Provider/Nurse Contacted: Dr. Nance Pear, PharmD, MS PGPM Clinical Pharmacist 05-27-2021 5:59 PM

## 2021-05-16 NOTE — ED Triage Notes (Signed)
Pt arrived by EMS from [facility]. Staff called for Fever and AMS. Pt oriented to person and situation but not time or place. EMS reports elevated HR and low O2 sats

## 2021-05-16 NOTE — ED Provider Notes (Signed)
[facility] Provider Note    Event Date/Time   First MD Initiated Contact with Patient 05/26/2021 1639     (approximate)   History   Altered Mental Status   HPI  Terri Bender is a 56 y.o. female past medical history of hypertension, CVA with hemiplegia and dysarthria from old MCA stroke, obesity who presents with fever and altered mental status.  Independent history obtained from EMS says that at the facility she had a temp of 100.2 and was more confused.  Patient tells me that she feels unwell.  She endorses pain in the right shoulder but says that this is from a prior injury is not new.  She denies chest pain shortness of breath cough fevers chills.  She initially denies abdominal pain but then does endorse some periumbilical discomfort.  No diarrhea or constipation.  Denies urinary symptoms.     Past Medical History:  Diagnosis Date   Anxiety    Depression    Frequent headaches    History of chicken pox    Hypertension    Stroke [facility])     Patient Active Problem List   Diagnosis Date Noted   Pancolitis ([place]) 05/18/2021   COVID-19 virus infection 06/23/2020   Hypokalemia 06/22/2020   Generalized weakness 06/22/2020   History of COVID-19 06/22/2020   Elevated LFTs Q000111Q   Acute metabolic encephalopathy [id number]   Obesity, Class III, BMI 40-49.9 (morbid obesity) ([place]) 06/22/2020   Dysarthria as late effect of cerebrovascular accident (CVA) 06/22/2020   Hemiplegia left side as late effect of cerebral infarction ([place]) 06/22/2020   Anxiety and depression 06/22/2020   Adjustment disorder with mixed disturbance of emotions and conduct 01/21/2018   CVA (cerebral vascular accident) ([place]) 05/11/2017   HTN (hypertension) 05/11/2017   Smoker 05/08/2017     Physical Exam  Triage Vital Signs: ED Triage Vitals  Enc Vitals Group     BP 05/20/2021 1630 104/68     Pulse Rate 05/06/2021 1630 (!) 121     Resp 05/12/2021 1630 20     Temp 04/30/2021 1630  100.2 F (37.9 C)     Temp Source 05/10/2021 1630 Oral     SpO2 05/15/2021 1627 (!) 87 %     Weight 05/24/2021 1631 165 lb (74.8 kg)     Height 05/27/2021 1631 5\' 2"  (1.575 m)     Head Circumference --      Peak Flow --      Pain Score 05/25/2021 1631 8     Pain Loc --      Pain Edu? --      Excl. in [place]? --     Most recent vital signs: Vitals:   05/17/21 0000 05/17/21 0030  BP: (!) 90/49 (!) 92/57  Pulse: 96 100  Resp: (!) 25 (!) 22  Temp:    SpO2: 95% 94%     General: Awake, patient is diaphoretic, appears unwell CV:  Good peripheral perfusion.  Resp:  Normal effort.  No increased work of breathing Abd:  No distention.  Mildly distended, mildly tender especially in the left lower quadrant with no guarding Neuro:             Awake, oriented to place and person but not to time thinks it is 2013 Other:  Dry mucous membranes   ED Results / Procedures / Treatments  Labs (all labs ordered are listed, but only abnormal results are displayed) Labs Reviewed  LACTIC ACID, PLASMA - Abnormal;  Notable for the following components:      Result Value   Lactic Acid, Venous 3.2 (*)    All other components within normal limits  LACTIC ACID, PLASMA - Abnormal; Notable for the following components:   Lactic Acid, Venous 4.0 (*)    All other components within normal limits  COMPREHENSIVE METABOLIC PANEL - Abnormal; Notable for the following components:   Sodium 134 (*)    Potassium 3.2 (*)    Chloride 96 (*)    Glucose, Bld 100 (*)    Calcium 8.5 (*)    Albumin 2.7 (*)    AST 48 (*)    Alkaline Phosphatase 213 (*)    Anion gap 16 (*)    All other components within normal limits  CBC WITH DIFFERENTIAL/PLATELET - Abnormal; Notable for the following components:   WBC 35.6 (*)    Hemoglobin 15.6 (*)    HCT 46.9 (*)    Platelets 411 (*)    Neutro Abs 31.0 (*)    Monocytes Absolute 3.2 (*)    All other components within normal limits  PROTIME-INR - Abnormal; Notable for the following  components:   Prothrombin Time 16.8 (*)    INR 1.4 (*)    All other components within normal limits  URINALYSIS, COMPLETE (UACMP) WITH MICROSCOPIC - Abnormal; Notable for the following components:   Color, Urine YELLOW (*)    APPearance TURBID (*)    Protein, ur 100 (*)    Leukocytes,Ua TRACE (*)    Bacteria, UA MANY (*)    All other components within normal limits  CBG MONITORING, ED - Abnormal; Notable for the following components:   Glucose-Capillary 123 (*)    All other components within normal limits  TROPONIN I (HIGH SENSITIVITY) - Abnormal; Notable for the following components:   Troponin I (High Sensitivity) 29 (*)    All other components within normal limits  TROPONIN I (HIGH SENSITIVITY) - Abnormal; Notable for the following components:   Troponin I (High Sensitivity) 19 (*)    All other components within normal limits  RESP PANEL BY RT-PCR (FLU A&B, COVID) [id number]  CULTURE, BLOOD (ROUTINE X 2)  CULTURE, BLOOD (ROUTINE X 2)  URINE CULTURE  C DIFFICILE QUICK SCREEN W PCR REFLEX    GASTROINTESTINAL PANEL BY PCR, STOOL (REPLACES STOOL CULTURE)  APTT  CREATININE, SERUM  PATHOLOGIST SMEAR REVIEW  PROTIME-INR  CORTISOL-AM, BLOOD  PROCALCITONIN  BASIC METABOLIC PANEL  CBC     EKG     RADIOLOGY I reviewed the CXR which does not show any acute cardiopulmonary process; agree with radiology report     PROCEDURES:  Critical Care performed: Yes, see critical care procedure note(s)  .Critical Care Performed by: Rada Hay, MD Authorized by: Rada Hay, MD   Critical care provider statement:    Critical care time (minutes):  30   Critical care was time spent personally by me on the following activities:  Development of treatment plan with patient or surrogate, discussions with consultants, evaluation of patient's response to treatment, examination of patient, ordering and review of laboratory studies, ordering and review of radiographic studies,  ordering and performing treatments and interventions, pulse oximetry, re-evaluation of patient's condition and review of old charts .1-3 Lead EKG Interpretation Performed by: Rada Hay, MD Authorized by: Rada Hay, MD     Interpretation: abnormal     ECG rate assessment: tachycardic     Rhythm: sinus tachycardia     Ectopy: none  Conduction: normal    The patient is on the cardiac monitor to evaluate for evidence of arrhythmia and/or significant heart rate changes.   MEDICATIONS ORDERED IN ED: Medications  lactated ringers infusion (0 mLs Intravenous Stopped 05/17/2021 2231)  potassium chloride 10 mEq in 100 mL IVPB (10 mEq Intravenous New Bag/Given 05/17/21 0033)  aspirin EC tablet 325 mg (325 mg Oral Given 05/17/21 0013)  atorvastatin (LIPITOR) tablet 80 mg (has no administration in time range)  lisinopril (ZESTRIL) tablet 10 mg (10 mg Oral Not Given 05/10/2021 2322)  escitalopram (LEXAPRO) tablet 10 mg (10 mg Oral Given 05/17/21 0015)  traZODone (DESYREL) tablet 50 mg (has no administration in time range)  venlafaxine (EFFEXOR) tablet 75 mg (has no administration in time range)  levothyroxine (SYNTHROID) tablet 25 mcg (has no administration in time range)  lisdexamfetamine (VYVANSE) capsule 30 mg (has no administration in time range)  Acidophilus capsule CAPS 1 capsule (1 capsule Oral Not Given 05/15/2021 2229)  pantoprazole (PROTONIX) EC tablet 40 mg (40 mg Oral Given 05/17/21 0016)  senna (SENOKOT) tablet 8.6 mg (has no administration in time range)  melatonin tablet 5 mg (has no administration in time range)  baclofen (LIORESAL) tablet 5 mg (5 mg Oral Given 05/17/21 0014)  clonazePAM (KLONOPIN) tablet 0.5 mg (0.5 mg Oral Given 05/17/21 0016)  pregabalin (LYRICA) capsule 75 mg (75 mg Oral Given 05/17/21 0016)  cholecalciferol (VITAMIN D) tablet 1,000 Units (has no administration in time range)  feeding supplement (ENSURE ENLIVE / ENSURE PLUS) liquid 237 mL (has no  administration in time range)  potassium chloride (KLOR-CON) packet 40 mEq (40 mEq Oral Given 05/17/21 0021)  thiamine tablet 100 mg (100 mg Oral Given 05/17/21 0015)  enoxaparin (LOVENOX) injection 40 mg (40 mg Subcutaneous Given 05/17/21 0012)  0.9 % NaCl with KCl 40 mEq / L  infusion ( Intravenous New Bag/Given 05/17/21 0029)  metroNIDAZOLE (FLAGYL) IVPB 500 mg (has no administration in time range)  acetaminophen (TYLENOL) tablet 650 mg (has no administration in time range)    Or  acetaminophen (TYLENOL) suppository 650 mg (has no administration in time range)  ondansetron (ZOFRAN) tablet 4 mg (has no administration in time range)    Or  ondansetron (ZOFRAN) injection 4 mg (has no administration in time range)  morphine 2 MG/ML injection 2 mg (has no administration in time range)  ceFEPIme (MAXIPIME) 2 g in sodium chloride 0.9 % 100 mL IVPB (has no administration in time range)  vancomycin (VANCOCIN) IVPB 1000 mg/200 mL premix (has no administration in time range)  lactated ringers bolus 2,000 mL (0 mLs Intravenous Stopped 05/24/2021 2318)  ceFEPIme (MAXIPIME) 2 g in sodium chloride 0.9 % 100 mL IVPB (0 g Intravenous Stopped 05/04/2021 2120)  vancomycin (VANCOREADY) IVPB 2000 mg/400 mL (0 mg Intravenous Stopped 05/15/2021 2318)  metroNIDAZOLE (FLAGYL) IVPB 500 mg (0 mg Intravenous Stopped 05/27/2021 2121)  iohexol (OMNIPAQUE) 300 MG/ML solution 100 mL (100 mLs Intravenous Contrast Given 04/30/2021 1917)  metroNIDAZOLE (FLAGYL) IVPB 500 mg (0 mg Intravenous Stopped 05/17/21 0011)     IMPRESSION / MDM / ASSESSMENT AND PLAN / ED COURSE  I reviewed the triage vital signs and the nursing notes.                              Differential diagnosis includes, but is not limited to, sepsis UTI, pneumonia, intra-abdominal infection, meningitis 56 year old female presenting from her nursing facility with fever and altered mental  status.  She has a temp of 100.2 and was tachycardic, blood pressures borderline around  100s over 60s.  Patient is diaphoretic and looks unwell although she is awake and alert.  Oriented to person and place but not time.  On exam she does not have any increased work of breathing.  She has no meningismus.  Her abdomen is somewhat distended and tender in the left lower quadrant.  No obvious signs of cellulitis.  We will plan to work-up broadly for sepsis.  We will also obtain a CT of her head and CT abdomen given the abdominal tenderness.  Will likely need admission.  Patient's labs are consistent with sepsis.  Lactate is 4, she has a white count of 35 to 31% neutrophils.  UA somewhat borderline, bacteria many WBCs 21-50.  Creatinine is normal, mildly low potassium at 3.2 will replete.  CT head has no acute abnormality.  Chest x-ray does not show obvious infiltrate.  Obtained a CT abdomen pelvis with contrast which is notable for pancolitis.  Radiology comments could be infectious inflammatory less likely ischemic.  On repeat evaluation patient appears comfortable, really not complaining of any abdominal pain which would certainly not be consistent with ischemic colitis.  On further questioning she has had about a week of diarrhea, 3 loose stools per day.  Given she is in a nursing facility with this leukocytosis and colitis and concern for C. difficile. Clinical Course as of 05/17/21 0039  Tue May 16, 2021  1806 Lactic Acid, Venous(!!): 4.0 [KM]    Clinical Course User Index [KM] Rada Hay, MD     FINAL CLINICAL IMPRESSION(S) / ED DIAGNOSES   Final diagnoses:  Sepsis, due to unspecified organism, unspecified whether acute organ dysfunction present [facility])  Colitis     Rx / DC Orders   ED Discharge Orders     None        Note:  This document was prepared using Dragon voice recognition software and may include unintentional dictation errors.   Rada Hay, MD 05/17/21 [phone]

## 2021-05-16 NOTE — Progress Notes (Signed)
Pharmacy Antibiotic Note  Terri Bender is a 56 y.o. female admitted on 05/20/2021 with pancolitis.  Pharmacy has been consulted for vancomycin and cefepime dosing. In the ED she received 2000 mg IV vancomycin  Plan:  1) start cefepime 2 grams IV every 8 hours  2) start vancomycin 1000 mg IV Q 24 hrs Goal AUC 400-550 Expected AUC: 472.7 SCr used: 0.80 mg/dL (rounded up) Ke: 0.932 h-1, T1/2: 12.3h Daily renal function assessment while on IV vancomycin   Height: 5\' 2"  (157.5 cm) Weight: 74.8 kg (165 lb) IBW/kg (Calculated) : 50.1  Temp (24hrs), Avg:100.2 F (37.9 C), Min:100.2 F (37.9 C), Max:100.2 F (37.9 C)  Recent Labs  Lab 05/03/2021 1647 05/08/2021 1829  WBC 35.6*  --   CREATININE QUANTITY NOT SUFFICIENT, UNABLE TO PERFORM TEST 0.74  LATICACIDVEN 4.0* 3.2*    Estimated Creatinine Clearance: 75.3 mL/min (by C-G formula based on SCr of 0.74 mg/dL).    Allergies  Allergen Reactions   Erythromycin Nausea And Vomiting   Septra [Sulfamethoxazole-Trimethoprim] Hives   Sulfamethoxazole Other (See Comments)   Trimethoprim Other (See Comments)    Antimicrobials this admission: 01/17 vancomycin >>  01/17 metronidazole >>  01/17 cefepime >>  Microbiology results: 01/17 BCx: pending 01/17 UCx: pending  01/17 GI panel pending 01/17 C Diff: pending  Thank you for allowing pharmacy to be a part of this patients care.  2/17 05/30/2021 8:48 PM

## 2021-05-17 DIAGNOSIS — K5100 Ulcerative (chronic) pancolitis without complications: Secondary | ICD-10-CM | POA: Diagnosis not present

## 2021-05-17 DIAGNOSIS — A419 Sepsis, unspecified organism: Secondary | ICD-10-CM

## 2021-05-17 DIAGNOSIS — I959 Hypotension, unspecified: Secondary | ICD-10-CM

## 2021-05-17 DIAGNOSIS — E039 Hypothyroidism, unspecified: Secondary | ICD-10-CM

## 2021-05-17 DIAGNOSIS — E871 Hypo-osmolality and hyponatremia: Secondary | ICD-10-CM

## 2021-05-17 DIAGNOSIS — F419 Anxiety disorder, unspecified: Secondary | ICD-10-CM

## 2021-05-17 DIAGNOSIS — F32A Depression, unspecified: Secondary | ICD-10-CM

## 2021-05-17 DIAGNOSIS — E876 Hypokalemia: Secondary | ICD-10-CM | POA: Diagnosis not present

## 2021-05-17 LAB — BLOOD CULTURE ID PANEL (REFLEXED) - BCID2

## 2021-05-17 LAB — BASIC METABOLIC PANEL
Anion gap: 11 (ref 5–15)
BUN: 13 mg/dL (ref 6–20)
CO2: 20 mmol/L — ABNORMAL LOW (ref 22–32)
Calcium: 7.6 mg/dL — ABNORMAL LOW (ref 8.9–10.3)
Chloride: 101 mmol/L (ref 98–111)
Creatinine, Ser: 0.58 mg/dL (ref 0.44–1.00)
GFR, Estimated: >60 mL/min (ref >60)
Glucose, Bld: 102 mg/dL — ABNORMAL HIGH (ref 70–99)
Potassium: 3.6 mmol/L (ref 3.5–5.1)
Sodium: 132 mmol/L — ABNORMAL LOW (ref 135–145)

## 2021-05-17 LAB — PATHOLOGIST SMEAR REVIEW

## 2021-05-17 LAB — GASTROINTESTINAL PANEL BY PCR, STOOL (REPLACES STOOL CULTURE)

## 2021-05-17 LAB — CORTISOL-AM, BLOOD: Cortisol - AM: 38.9 ug/dL — ABNORMAL HIGH (ref 6.7–22.6)

## 2021-05-17 LAB — CBC
HCT: 39.7 % (ref 36.0–46.0)
Hemoglobin: 13.4 g/dL (ref 12.0–15.0)
MCH: 31.2 pg (ref 26.0–34.0)
MCHC: 33.8 g/dL (ref 30.0–36.0)
MCV: 92.3 fL (ref 80.0–100.0)
Platelets: 331 10*3/uL (ref 150–400)
RBC: 4.3 MIL/uL (ref 3.87–5.11)
RDW: 14.2 % (ref 11.5–15.5)
WBC: 34.2 10*3/uL — ABNORMAL HIGH (ref 4.0–10.5)
nRBC: 0 % (ref 0.0–0.2)

## 2021-05-17 LAB — PROTIME-INR
INR: 1.4 — ABNORMAL HIGH (ref 0.8–1.2)
Prothrombin Time: 17.1 seconds — ABNORMAL HIGH (ref 11.4–15.2)

## 2021-05-17 LAB — C DIFFICILE QUICK SCREEN W PCR REFLEX
C Diff antigen: POSITIVE — AB
C Diff interpretation: POSITIVE
C Diff toxin: POSITIVE — AB

## 2021-05-17 LAB — PROCALCITONIN: Procalcitonin: 2.52 ng/mL

## 2021-05-17 MED ORDER — METOPROLOL TARTRATE 50 MG PO TABS
50.0000 mg | ORAL_TABLET | Freq: Once | ORAL | Status: AC
  Administered 2021-05-17: 50 mg via ORAL
  Filled 2021-05-17: qty 1

## 2021-05-17 MED ORDER — METOPROLOL TARTRATE 25 MG PO TABS
12.5000 mg | ORAL_TABLET | Freq: Two times a day (BID) | ORAL | Status: DC
  Administered 2021-05-17: 12.5 mg via ORAL
  Filled 2021-05-17: qty 1

## 2021-05-17 MED ORDER — VANCOMYCIN HCL 750 MG/150ML IV SOLN
750.0000 mg | Freq: Two times a day (BID) | INTRAVENOUS | Status: DC
  Filled 2021-05-17: qty 150

## 2021-05-17 MED ORDER — VENLAFAXINE HCL ER 75 MG PO CP24
150.0000 mg | ORAL_CAPSULE | Freq: Every day | ORAL | Status: DC
  Administered 2021-05-17: 150 mg via ORAL
  Filled 2021-05-17: qty 1

## 2021-05-17 MED ORDER — SODIUM CHLORIDE 0.9 % IV SOLN
12.5000 mg | Freq: Once | INTRAVENOUS | Status: AC
  Administered 2021-05-17: 12.5 mg via INTRAVENOUS
  Filled 2021-05-17: qty 0.5

## 2021-05-17 MED ORDER — VANCOMYCIN HCL 125 MG PO CAPS
125.0000 mg | ORAL_CAPSULE | Freq: Four times a day (QID) | ORAL | Status: DC
  Administered 2021-05-17 – 2021-05-18 (×2): 125 mg via ORAL
  Filled 2021-05-17 (×6): qty 1

## 2021-05-17 MED ORDER — METOPROLOL TARTRATE 5 MG/5ML IV SOLN
5.0000 mg | INTRAVENOUS | Status: DC | PRN
  Administered 2021-05-17 – 2021-05-18 (×4): 5 mg via INTRAVENOUS
  Filled 2021-05-17 (×4): qty 5

## 2021-05-17 MED ORDER — VENLAFAXINE HCL ER 75 MG PO CP24
75.0000 mg | ORAL_CAPSULE | Freq: Every day | ORAL | Status: DC
  Administered 2021-05-17 – 2021-05-18 (×2): 75 mg via ORAL
  Filled 2021-05-17 (×2): qty 1

## 2021-05-17 MED ORDER — LOPERAMIDE HCL 2 MG PO CAPS: 4.0000 mg | ORAL_CAPSULE | Freq: Three times a day (TID) | ORAL | Status: DC | PRN

## 2021-05-17 NOTE — Progress Notes (Signed)
Patient ID: Terri Bender, female   DOB: [birth year], 56 y.o.   MRN: [id number]  C. difficile colitis  Discontinued IV antibiotics Started oral vancomycin  Dr Loletha Grayer

## 2021-05-17 NOTE — ED Notes (Signed)
Pt complaining of nausea, threw up once this morning, pt cleaned up and changed into a new gown. MD notified.

## 2021-05-17 NOTE — TOC Initial Note (Signed)
Transition of Care [facility]) - Initial/Assessment Note    Patient Details  Name: Terri Bender MRN: [id number] Date of Birth: Oct 21, 1965  [facility]) CM/SW Contact:    Shelbie Hutching, RN Phone Number: 05/17/2021, 1:19 PM  Clinical Narrative:                 Patient admitted to the hospital for sepsis, came in from [facility] with Fever and AMS.  RNCM met with patient at the bedside, introduced self and explained role.  Patient is a long term resident of [facility], she reports being there for 4 + years now since she had a stroke and lost mobility on her left side.   Patient reports that her ex husband is her person to make decisions if she cannot it, it is okay with her if he needs to be contacted for any information.   Patient wants to get back to [facility] as soon as she can.    TOC will cont to follow.   Expected Discharge Plan: [place] Barriers to Discharge: Continued Medical Work up   Patient Goals and CMS Choice Patient states their goals for this hospitalization and ongoing recovery are:: Patient wants to get back home to [email or web address] Compare Post Acute Care list provided to:: Patient Choice offered to / list presented to : Patient  Expected Discharge Plan and Services Expected Discharge Plan: [place]   Discharge Planning Services: CM Consult Post Acute Care Choice: Resumption of Svcs/PTA Provider, [place]                   DME Arranged: N/A DME Agency: NA       HH Arranged: NA HH Agency: NA        Prior Living Arrangements/Services   Lives with:: Facility Resident Patient language and need for interpreter reviewed:: Yes Do you feel safe going back to the place where you live?: Yes      Need for Family Participation in Patient Care: Yes (Comment) Care giver support system in place?: Yes (comment)   Criminal Activity/Legal Involvement Pertinent to Current  Situation/Hospitalization: No - Comment as needed  Activities of Daily Living Home Assistive Devices/Equipment: Environmental consultant (specify type), Wheelchair ADL Screening (condition at time of admission) Patient's cognitive ability adequate to safely complete daily activities?: Yes Is the patient deaf or have difficulty hearing?: No Does the patient have difficulty seeing, even when wearing glasses/contacts?: No Does the patient have difficulty concentrating, remembering, or making decisions?: No Patient able to express need for assistance with ADLs?: Yes Does the patient have difficulty dressing or bathing?: Yes Independently performs ADLs?: No Communication: Independent Dressing (OT): Needs assistance Is this a change from baseline?: Pre-admission baseline Grooming: Needs assistance Is this a change from baseline?: Pre-admission baseline Feeding: Needs assistance Is this a change from baseline?: Pre-admission baseline Bathing: Needs assistance Is this a change from baseline?: Pre-admission baseline Toileting: Dependent Is this a change from baseline?: Pre-admission baseline In/Out Bed: Dependent Is this a change from baseline?: Pre-admission baseline Walks in Home: Needs assistance Is this a change from baseline?: Pre-admission baseline Does the patient have difficulty walking or climbing stairs?: Yes Weakness of Legs: Both Weakness of Arms/Hands: Both  Permission Sought/Granted Permission sought to share information with : Case Manager, Customer service manager, Family Supports Permission granted to share information with : Yes, Verbal Permission Granted  Share Information with NAME: Lavonia Eager  Permission granted to  share info w AGENCY: [place] granted to share info w Relationship: ex husband and POA  Permission granted to share info w Contact Information: [phone]  Emotional Assessment Appearance:: Appears stated age Attitude/Demeanor/Rapport:  Engaged Affect (typically observed): Accepting Orientation: : Oriented to Self, Oriented to Place, Oriented to Situation Alcohol / Substance Use: Not Applicable Psych Involvement: No (comment)  Admission diagnosis:  Colitis [K52.9] Pancolitis ([place]) [K51.00] Sepsis, due to unspecified organism, unspecified whether acute organ dysfunction present [facility]) [A41.9] Patient Active Problem List   Diagnosis Date Noted   Pancolitis ([place]) 05/05/2021   COVID-19 virus infection 06/23/2020   Hypokalemia 06/22/2020   Generalized weakness 06/22/2020   History of COVID-19 06/22/2020   Elevated LFTs 01/56/1537   Acute metabolic encephalopathy 94/32/7614   Obesity, Class III, BMI 40-49.9 (morbid obesity) ([place]) 06/22/2020   Dysarthria as late effect of cerebrovascular accident (CVA) 06/22/2020   Hemiplegia left side as late effect of cerebral infarction ([place]) 06/22/2020   Anxiety and depression 06/22/2020   Adjustment disorder with mixed disturbance of emotions and conduct 01/21/2018   CVA (cerebral vascular accident) ([place]) 05/11/2017   HTN (hypertension) 05/11/2017   Smoker 05/08/2017   PCP:  Alvester Morin, MD Pharmacy:   [id number] [address], [place] [address] [place] [address] Phone: [phone] Fax: [phone]     Social Determinants of Health (SDOH) Interventions    Readmission Risk Interventions Readmission Risk Prevention Plan 05/17/2021  Transportation Screening Complete  PCP or Specialist Appt within 5-7 Days Complete  Home Care Screening Complete  Medication Review (RN CM) Complete  Some recent data might be hidden

## 2021-05-17 NOTE — NC FL2 (Signed)
[place] LEVEL OF CARE SCREENING TOOL     IDENTIFICATION  Patient Name: Terri Bender Birthdate: March 07, 1966 Sex: female Admission Date (Current Location): 05/03/2021  [facility] and [place] Number:  Engineering geologist and Address:  [facility], [address], [place], [place] [address]      Provider Number: [id number]  Attending Physician Name and Address:  Christel Mormon, MD  Relative Name and Phone Number:  Cristalle Bailie- ex husband- [phone]    Current Level of Care: Hospital Recommended Level of Care: [place] Prior Approval Number:    Date Approved/Denied:   PASRR Number:    Discharge Plan: SNF    Current Diagnoses: Patient Active Problem List   Diagnosis Date Noted   Pancolitis ([place]) 05/10/2021   COVID-19 virus infection 06/23/2020   Hypokalemia 06/22/2020   Generalized weakness 06/22/2020   History of COVID-19 06/22/2020   Elevated LFTs Q000111Q   Acute metabolic encephalopathy Q000111Q   Obesity, Class III, BMI 40-49.9 (morbid obesity) ([place]) 06/22/2020   Dysarthria as late effect of cerebrovascular accident (CVA) 06/22/2020   Hemiplegia left side as late effect of cerebral infarction ([place]) 06/22/2020   Anxiety and depression 06/22/2020   Adjustment disorder with mixed disturbance of emotions and conduct 01/21/2018   CVA (cerebral vascular accident) ([place]) 05/11/2017   HTN (hypertension) 05/11/2017   Smoker 05/08/2017    Orientation RESPIRATION BLADDER Height & Weight     Self, Situation, Place  Normal Incontinent, External catheter Weight: 74.8 kg Height:  5\' 2"  (157.5 cm)  BEHAVIORAL SYMPTOMS/MOOD NEUROLOGICAL BOWEL NUTRITION STATUS      Incontinent Diet (see discharge summary)  AMBULATORY STATUS COMMUNICATION OF NEEDS Skin   Extensive Assist Verbally Normal                       Personal Care Assistance Level of Assistance  Bathing, Feeding, Dressing Bathing Assistance: Maximum  assistance Feeding assistance: Limited assistance Dressing Assistance: Maximum assistance     Functional Limitations Info  Sight, Hearing, Speech Sight Info: Adequate Hearing Info: Adequate Speech Info: Adequate (slurred at times)    SPECIAL CARE FACTORS FREQUENCY                       Contractures      Additional Factors Info  Isolation Precautions         Isolation Precautions Info: Enteric until C Diff ruled out     Current Medications (05/17/2021):  This is the current hospital active medication list Current Facility-Administered Medications  Medication Dose Route Frequency Provider Last Rate Last Admin   0.9 % NaCl with KCl 40 mEq / L  infusion   Intravenous Continuous Loletha Grayer, MD 50 mL/hr at 05/17/21 1258 New Bag at 05/17/21 1258   acetaminophen (TYLENOL) tablet 650 mg  650 mg Oral Q6H PRN Mansy, Jan A, MD       Or   acetaminophen (TYLENOL) suppository 650 mg  650 mg Rectal Q6H PRN Mansy, Jan A, MD       acidophilus (RISAQUAD) capsule 1 capsule  1 capsule Oral Daily Mansy, Jan A, MD   1 capsule at 05/17/21 1014   aspirin EC tablet 325 mg  325 mg Oral Daily Mansy, Jan A, MD   325 mg at 05/17/21 1013   atorvastatin (LIPITOR) tablet 80 mg  80 mg Oral q1800 Mansy, Jan A, MD       baclofen (LIORESAL) tablet 5  mg  5 mg Oral QHS Mansy, Jan A, MD   5 mg at 05/17/21 0014   ceFEPIme (MAXIPIME) 2 g in sodium chloride 0.9 % 100 mL IVPB  2 g Intravenous Q8H Dallie Piles, RPH   Paused at 05/17/21 [id number]   cholecalciferol (VITAMIN D) tablet 1,000 Units  1,000 Units Oral Daily Mansy, Jan A, MD   1,000 Units at 05/17/21 1014   clonazePAM (KLONOPIN) tablet 0.5 mg  0.5 mg Oral BID PRN Mansy, Jan A, MD   0.5 mg at 05/17/21 0016   enoxaparin (LOVENOX) injection 40 mg  40 mg Subcutaneous Q24H Mansy, Jan A, MD   40 mg at 05/17/21 0012   feeding supplement (ENSURE ENLIVE / ENSURE PLUS) liquid 237 mL  237 mL Oral BID BM Mansy, Jan A, MD       levothyroxine (SYNTHROID) tablet 25  mcg  25 mcg Oral QAC breakfast Mansy, Jan A, MD   25 mcg at 05/17/21 1013   lisdexamfetamine (VYVANSE) capsule 30 mg  30 mg Oral Daily Mansy, Jan A, MD       melatonin tablet 5 mg  5 mg Oral QHS PRN Mansy, Jan A, MD       metroNIDAZOLE (FLAGYL) IVPB 500 mg  500 mg Intravenous Q12H Mansy, Jan A, MD   Stopped at 05/17/21 0830   morphine 2 MG/ML injection 2 mg  2 mg Intravenous Q4H PRN Mansy, Jan A, MD       ondansetron [facility]) tablet 4 mg  4 mg Oral Q6H PRN Mansy, Jan A, MD       Or   ondansetron [facility]) injection 4 mg  4 mg Intravenous Q6H PRN Mansy, Jan A, MD   4 mg at 05/17/21 1252   pantoprazole (PROTONIX) EC tablet 40 mg  40 mg Oral Daily Mansy, Jan A, MD   40 mg at 05/17/21 1014   potassium chloride (KLOR-CON) packet 40 mEq  40 mEq Oral Daily Mansy, Jan A, MD   40 mEq at 05/17/21 1131   pregabalin (LYRICA) capsule 75 mg  75 mg Oral QHS Mansy, Jan A, MD   75 mg at 05/17/21 0016   thiamine tablet 100 mg  100 mg Oral Daily Mansy, Jan A, MD   100 mg at 05/17/21 1013   traZODone (DESYREL) tablet 50 mg  50 mg Oral QHS PRN Mansy, Jan A, MD       vancomycin (VANCOREADY) IVPB 750 mg/150 mL  750 mg Intravenous Q12H Berton Mount, RPH       venlafaxine XR (EFFEXOR-XR) 24 hr capsule 150 mg  150 mg Oral QHS Dorothe Pea, RPH       venlafaxine XR (EFFEXOR-XR) 24 hr capsule 75 mg  75 mg Oral Q breakfast Dorothe Pea, RPH   75 mg at 05/17/21 1014     Discharge Medications: Please see discharge summary for a list of discharge medications.  Relevant Imaging Results:  Relevant Lab Results:   Additional Information SSN [id number]  Shelbie Hutching, RN

## 2021-05-17 NOTE — ED Notes (Signed)
Pt to be admitted to room 246, report given to [facility]. VSS. ED NT to transport pt via stretcher.

## 2021-05-17 NOTE — ED Notes (Signed)
Patient states she "feels like she is going to throw up". PRN med orders reviewed.

## 2021-05-17 NOTE — ED Notes (Signed)
While entering pt's room to administer zofran, pt has vomited all over her bed and gown.

## 2021-05-17 NOTE — Progress Notes (Addendum)
Pharmacy Antibiotic Note  Terri Bender is a 56 y.o. female admitted on 05/17/2021 with pancolitis.  Pharmacy has been consulted for vancomycin and cefepime dosing. In the ED she received 2000 mg IV vancomycin  Today, 05/17/2021 Renal function WNL WBC remains elevated Preliminary blood cx: NGTD GI Panel: neg Urine cx pending C difficile - ? Pending (states collected but not in process)  Plan:  1) Continue cefepime 2 grams IV every 8 hours  2) Adjust vancomycin to 750mg  IV q12h Goal AUC 400-600 Expected AUC: 492.4 SCr used: 0.80 mg/dL (rounded up) Daily renal function assessment while on IV vancomycin. Check levels as clinically indicated 3) Follow-up need for IV vancomycin for pancolitis  Height: 5\' 2"  (157.5 cm) Weight: 74.8 kg (165 lb) IBW/kg (Calculated) : 50.1  Temp (24hrs), Avg:99.5 F (37.5 C), Min:99.2 F (37.3 C), Max:100.2 F (37.9 C)  Recent Labs  Lab 05/20/2021 1647 05/11/2021 1829 05/17/21 0623  WBC 35.6*  --  34.2*  CREATININE QUANTITY NOT SUFFICIENT, UNABLE TO PERFORM TEST 0.74 0.58  LATICACIDVEN 4.0* 3.2*  --      Estimated Creatinine Clearance: 75.3 mL/min (by C-G formula based on SCr of 0.58 mg/dL).    Allergies  Allergen Reactions   Erythromycin Nausea And Vomiting   Septra [Sulfamethoxazole-Trimethoprim] Hives   Sulfamethoxazole Other (See Comments)   Trimethoprim Other (See Comments)    Antimicrobials this admission: 01/17 vancomycin >>  01/17 metronidazole >>  01/17 cefepime >>  Microbiology results: 01/17 BCx: NG <12h 01/17 UCx: pending  01/17 GI panel Neg 01/17 C Diff: pending  Thank you for allowing pharmacy to be a part of this patients care.  Doreene Eland, PharmD, BCPS, BCIDP Work Cell: [phone] 05/17/2021 8:34 AM

## 2021-05-17 NOTE — Progress Notes (Signed)
Patient ID: Terri Bender, female   DOB: November 28, 1965, 56 y.o.   MRN: [id number] [facility] PROGRESS NOTE  Terri Bender Self [id number] DOB: 06/21/1965 DOA: 04/30/2021 PCP: Alvester Morin, MD  HPI/Subjective: Patient with some abdominal bloating and diarrhea and nausea vomiting.  Does not feel well.  States she is going home today.  Admitted with colitis.  Objective: Vitals:   05/17/21 1151 05/17/21 1219  BP: 107/72 102/68  Pulse: (!) 122 91  Resp: 18 18  Temp: (!) 97.5 F (36.4 C) 98.2 F (36.8 C)  SpO2: 96%     Intake/Output Summary (Last 24 hours) at 05/17/2021 1352 Last data filed at 05/17/2021 [id number] Gross per 24 hour  Intake 3845.97 ml  Output --  Net 3845.97 ml   Filed Weights   05/08/2021 1631  Weight: 74.8 kg    ROS: Review of Systems  Respiratory:  Negative for shortness of breath.   Cardiovascular:  Negative for chest pain.  Gastrointestinal:  Positive for abdominal pain, diarrhea, nausea and vomiting.  Exam: Physical Exam HENT:     Head: Normocephalic.     Mouth/Throat:     Pharynx: No oropharyngeal exudate.  Eyes:     General: Lids are normal.     Conjunctiva/sclera: Conjunctivae normal.  Cardiovascular:     Rate and Rhythm: Normal rate and regular rhythm.     Heart sounds: Normal heart sounds, S1 normal and S2 normal.  Pulmonary:     Breath sounds: Normal breath sounds. No decreased breath sounds, wheezing, rhonchi or rales.  Abdominal:     Palpations: Abdomen is soft.     Tenderness: There is no abdominal tenderness.  Musculoskeletal:     Right lower leg: No swelling.     Left lower leg: No swelling.  Skin:    General: Skin is warm.     Findings: No rash.  Neurological:     Mental Status: She is alert.     Comments: Left-sided hemiparesis      Scheduled Meds:  acidophilus  1 capsule Oral Daily   aspirin  325 mg Oral Daily   atorvastatin  80 mg Oral q1800   baclofen  5 mg Oral QHS   cholecalciferol  1,000 Units Oral Daily    enoxaparin (LOVENOX) injection  40 mg Subcutaneous Q24H   feeding supplement  237 mL Oral BID BM   levothyroxine  25 mcg Oral QAC breakfast   lisdexamfetamine  30 mg Oral Daily   pantoprazole  40 mg Oral Daily   potassium chloride  40 mEq Oral Daily   pregabalin  75 mg Oral QHS   thiamine  100 mg Oral Daily   venlafaxine XR  150 mg Oral QHS   venlafaxine XR  75 mg Oral Q breakfast   Continuous Infusions:  0.9 % NaCl with KCl 40 mEq / L 50 mL/hr at 05/17/21 1258   ceFEPime (MAXIPIME) IV Stopped (05/17/21 0539)   metronidazole Stopped (05/17/21 0830)   vancomycin      Assessment/Plan:  Clinical sepsis with acute pancolitis.  With white count being very elevated I suspect C. difficile colitis but test not sent off yet.  Patient also has tachycardia.  Patient empirically on antibiotics with vancomycin Maxipime and metronidazole. Hypokalemia and hyponatremia.  Continue IV fluids with potassium. Elevated liver function test likely secondary to sepsis Anxiety depression on Klonopin, Effexor and Lexapro Hypothyroidism unspecified on levothyroxine Relative hypotension holding antihypertensive medications GERD on PPI History of stroke with left-sided hemiparesis on aspirin and  atorvastatin    Code Status:     Code Status Orders  (From admission, onward)           Start     Ordered   05/14/2021 2041  Full code  Continuous        05/30/2021 2042           Code Status History     Date Active Date Inactive Code Status Order ID Comments User Context   06/22/2020 2347 06/23/2020 2256 Full Code [id number]  Athena Masse, MD ED   05/11/2017 2130 05/16/2017 1621 Full Code [id number]  Quintella Baton, MD Inpatient      Disposition Plan: Status is: Inpatient   Terri Bender [facility]  [facility]

## 2021-05-18 ENCOUNTER — Inpatient Hospital Stay: Payer: Medicare Other

## 2021-05-18 ENCOUNTER — Encounter: Payer: Self-pay | Admitting: Family Medicine

## 2021-05-18 ENCOUNTER — Other Ambulatory Visit (HOSPITAL_COMMUNITY): Payer: Self-pay

## 2021-05-18 DIAGNOSIS — A0472 Enterocolitis due to Clostridium difficile, not specified as recurrent: Secondary | ICD-10-CM

## 2021-05-18 DIAGNOSIS — R7989 Other specified abnormal findings of blood chemistry: Secondary | ICD-10-CM | POA: Diagnosis not present

## 2021-05-18 DIAGNOSIS — D72829 Elevated white blood cell count, unspecified: Secondary | ICD-10-CM

## 2021-05-18 DIAGNOSIS — R000 Tachycardia, unspecified: Secondary | ICD-10-CM

## 2021-05-18 DIAGNOSIS — A419 Sepsis, unspecified organism: Secondary | ICD-10-CM | POA: Diagnosis not present

## 2021-05-18 DIAGNOSIS — R6521 Severe sepsis with septic shock: Secondary | ICD-10-CM | POA: Diagnosis not present

## 2021-05-18 DIAGNOSIS — K5100 Ulcerative (chronic) pancolitis without complications: Secondary | ICD-10-CM | POA: Diagnosis not present

## 2021-05-18 DIAGNOSIS — G9340 Encephalopathy, unspecified: Secondary | ICD-10-CM

## 2021-05-18 DIAGNOSIS — E872 Acidosis, unspecified: Secondary | ICD-10-CM

## 2021-05-18 LAB — BLOOD GAS, ARTERIAL
Acid-base deficit: 14.9 mmol/L — ABNORMAL HIGH (ref 0.0–2.0)
Bicarbonate: 10.9 mmol/L — ABNORMAL LOW (ref 20.0–28.0)
FIO2: 1
MECHVT: 450 mL
O2 Saturation: 99.9 %
PEEP: 10 cmH2O
Patient temperature: 37
RATE: 18 resp/min
pCO2 arterial: 26 mmHg — ABNORMAL LOW (ref 32.0–48.0)
pH, Arterial: 7.23 — ABNORMAL LOW (ref 7.350–7.450)
pO2, Arterial: 299 mmHg — ABNORMAL HIGH (ref 83.0–108.0)

## 2021-05-18 LAB — CBC WITH DIFFERENTIAL/PLATELET
Abs Immature Granulocytes: 5.09 10*3/uL — ABNORMAL HIGH (ref 0.00–0.07)
Basophils Absolute: 0.1 10*3/uL (ref 0.0–0.1)
Basophils Relative: 0 %
Eosinophils Absolute: 0.2 10*3/uL (ref 0.0–0.5)
Eosinophils Relative: 1 %
HCT: 32.6 % — ABNORMAL LOW (ref 36.0–46.0)
Hemoglobin: 10.7 g/dL — ABNORMAL LOW (ref 12.0–15.0)
Immature Granulocytes: 12 %
Lymphocytes Relative: 11 %
Lymphs Abs: 4.7 10*3/uL — ABNORMAL HIGH (ref 0.7–4.0)
MCH: 31.8 pg (ref 26.0–34.0)
MCHC: 32.8 g/dL (ref 30.0–36.0)
MCV: 97 fL (ref 80.0–100.0)
Monocytes Absolute: 2.4 10*3/uL — ABNORMAL HIGH (ref 0.1–1.0)
Monocytes Relative: 5 %
Neutro Abs: 30.9 10*3/uL — ABNORMAL HIGH (ref 1.7–7.7)
Neutrophils Relative %: 71 %
Platelets: 289 10*3/uL (ref 150–400)
RBC: 3.36 MIL/uL — ABNORMAL LOW (ref 3.87–5.11)
RDW: 15.6 % — ABNORMAL HIGH (ref 11.5–15.5)
Smear Review: NORMAL
WBC: 43.4 10*3/uL — ABNORMAL HIGH (ref 4.0–10.5)
nRBC: 0.4 % — ABNORMAL HIGH (ref 0.0–0.2)

## 2021-05-18 LAB — LACTIC ACID, PLASMA: Lactic Acid, Venous: 6.2 mmol/L (ref 0.5–1.9)

## 2021-05-18 LAB — BASIC METABOLIC PANEL
Anion gap: 10 (ref 5–15)
BUN: 24 mg/dL — ABNORMAL HIGH (ref 6–20)
CO2: 20 mmol/L — ABNORMAL LOW (ref 22–32)
Calcium: 5.7 mg/dL — CL (ref 8.9–10.3)
Chloride: 102 mmol/L (ref 98–111)
Creatinine, Ser: 1.46 mg/dL — ABNORMAL HIGH (ref 0.44–1.00)
GFR, Estimated: 42 mL/min — ABNORMAL LOW (ref >60)
Glucose, Bld: 955 mg/dL (ref 70–99)
Potassium: 4 mmol/L (ref 3.5–5.1)
Sodium: 132 mmol/L — ABNORMAL LOW (ref 135–145)

## 2021-05-18 LAB — CULTURE, BLOOD (ROUTINE X 2)

## 2021-05-18 LAB — URINE CULTURE

## 2021-05-18 LAB — GLUCOSE, CAPILLARY
Glucose-Capillary: 77 mg/dL (ref 70–99)
Glucose-Capillary: 77 mg/dL (ref 70–99)
Glucose-Capillary: 163 mg/dL — ABNORMAL HIGH (ref 70–99)
Glucose-Capillary: 194 mg/dL — ABNORMAL HIGH (ref 70–99)
Glucose-Capillary: 230 mg/dL — ABNORMAL HIGH (ref 70–99)

## 2021-05-18 MED ORDER — IOHEXOL 300 MG/ML  SOLN
100.0000 mL | Freq: Once | INTRAMUSCULAR | Status: AC | PRN
  Administered 2021-05-18: 100 mL via INTRAVENOUS

## 2021-05-18 MED ORDER — DOCUSATE SODIUM 50 MG/5ML PO LIQD: 100.0000 mg | Freq: Two times a day (BID) | ORAL | Status: DC | PRN

## 2021-05-18 MED ORDER — POLYETHYLENE GLYCOL 3350 17 G PO PACK: 17.0000 g | PACK | Freq: Every day | ORAL | Status: DC | PRN

## 2021-05-18 MED ORDER — PROPOFOL 1000 MG/100ML IV EMUL
INTRAVENOUS | Status: AC
  Filled 2021-05-18: qty 100

## 2021-05-18 MED ORDER — DEXTROSE 50 % IV SOLN
INTRAVENOUS | Status: AC
  Administered 2021-05-18: 50 mL via INTRAVENOUS
  Filled 2021-05-18: qty 50

## 2021-05-18 MED ORDER — METRONIDAZOLE 500 MG/100ML IV SOLN
500.0000 mg | Freq: Three times a day (TID) | INTRAVENOUS | Status: DC
  Administered 2021-05-18 – 2021-05-19 (×3): 500 mg via INTRAVENOUS
  Filled 2021-05-18 (×4): qty 100

## 2021-05-18 MED ORDER — DOCUSATE SODIUM 100 MG PO CAPS: 100.0000 mg | ORAL_CAPSULE | Freq: Two times a day (BID) | ORAL | Status: DC | PRN

## 2021-05-18 MED ORDER — PROPOFOL 1000 MG/100ML IV EMUL
5.0000 ug/kg/min | INTRAVENOUS | Status: DC
  Administered 2021-05-18 (×2): 10 ug/kg/min via INTRAVENOUS

## 2021-05-18 MED ORDER — CHLORHEXIDINE GLUCONATE 0.12% ORAL RINSE (MEDLINE KIT)
15.0000 mL | Freq: Two times a day (BID) | OROMUCOSAL | Status: DC
  Administered 2021-05-18 – 2021-05-19 (×2): 15 mL via OROMUCOSAL

## 2021-05-18 MED ORDER — ORAL CARE MOUTH RINSE
15.0000 mL | OROMUCOSAL | Status: DC
  Administered 2021-05-18 – 2021-05-19 (×8): 15 mL via OROMUCOSAL

## 2021-05-18 MED ORDER — SODIUM BICARBONATE 8.4 % IV SOLN
50.0000 meq | Freq: Once | INTRAVENOUS | Status: AC
  Administered 2021-05-18: 50 meq via INTRAVENOUS

## 2021-05-18 MED ORDER — DEXTROSE 50 % IV SOLN
INTRAVENOUS | Status: AC
  Administered 2021-05-18: 50 mL via INTRAVENOUS
  Filled 2021-05-18: qty 100

## 2021-05-18 MED ORDER — DEXTROSE 50 % IV SOLN: 2.0000 | Freq: Once | INTRAVENOUS | Status: AC

## 2021-05-18 MED ORDER — METOPROLOL TARTRATE 25 MG PO TABS
25.0000 mg | ORAL_TABLET | Freq: Three times a day (TID) | ORAL | Status: DC
  Administered 2021-05-18: 25 mg via ORAL
  Filled 2021-05-18: qty 1

## 2021-05-18 MED ORDER — SODIUM CHLORIDE 0.9 % IV SOLN: INTRAVENOUS | Status: DC

## 2021-05-18 MED ORDER — ALBUMIN HUMAN 5 % IV SOLN: 25.0000 g | Freq: Once | INTRAVENOUS | Status: DC

## 2021-05-18 MED ORDER — FENTANYL 2500MCG IN NS 250ML (10MCG/ML) PREMIX INFUSION
INTRAVENOUS | Status: AC
  Administered 2021-05-18: 50 ug/h via INTRAVENOUS
  Filled 2021-05-18: qty 250

## 2021-05-18 MED ORDER — DEXTROSE 50 % IV SOLN
1.0000 | Freq: Once | INTRAVENOUS | Status: AC
  Administered 2021-05-18: 50 mL via INTRAVENOUS

## 2021-05-18 MED ORDER — ETOMIDATE 2 MG/ML IV SOLN
20.0000 mg | Freq: Once | INTRAVENOUS | Status: AC
  Administered 2021-05-18: 20 mg via INTRAVENOUS

## 2021-05-18 MED ORDER — NOREPINEPHRINE 4 MG/250ML-% IV SOLN
INTRAVENOUS | Status: AC
  Filled 2021-05-18: qty 250

## 2021-05-18 MED ORDER — ALBUMIN HUMAN 5 % IV SOLN
25.0000 g | Freq: Once | INTRAVENOUS | Status: AC
  Administered 2021-05-18: 25 g via INTRAVENOUS
  Filled 2021-05-18: qty 500

## 2021-05-18 MED ORDER — CALCIUM GLUCONATE-NACL 2-0.675 GM/100ML-% IV SOLN
2.0000 g | Freq: Once | INTRAVENOUS | Status: AC
  Administered 2021-05-18: 2000 mg via INTRAVENOUS
  Filled 2021-05-18: qty 100

## 2021-05-18 MED ORDER — IOHEXOL 240 MG/ML SOLN: 25.0000 mL | INTRAMUSCULAR | Status: DC

## 2021-05-18 MED ORDER — FENTANYL 2500MCG IN NS 250ML (10MCG/ML) PREMIX INFUSION
0.0000 ug/h | INTRAVENOUS | Status: DC
  Administered 2021-05-18: 150 ug/h via INTRAVENOUS
  Filled 2021-05-18: qty 250

## 2021-05-18 MED ORDER — SODIUM CHLORIDE 0.9 % IV SOLN
250.0000 mL | INTRAVENOUS | Status: DC
  Administered 2021-05-18: 250 mL via INTRAVENOUS

## 2021-05-18 MED ORDER — SODIUM BICARBONATE 8.4 % IV SOLN
INTRAVENOUS | Status: AC
  Filled 2021-05-18: qty 100

## 2021-05-18 MED ORDER — FAMOTIDINE IN NACL 20-0.9 MG/50ML-% IV SOLN
20.0000 mg | Freq: Two times a day (BID) | INTRAVENOUS | Status: DC
  Administered 2021-05-18: 20 mg via INTRAVENOUS
  Filled 2021-05-18: qty 50

## 2021-05-18 MED ORDER — ASPIRIN EC 81 MG PO TBEC: 81.0000 mg | DELAYED_RELEASE_TABLET | Freq: Every day | ORAL | Status: DC

## 2021-05-18 MED ORDER — CHLORHEXIDINE GLUCONATE CLOTH 2 % EX PADS
6.0000 | MEDICATED_PAD | Freq: Every day | CUTANEOUS | Status: DC
  Administered 2021-05-18 – 2021-05-19 (×2): 6 via TOPICAL

## 2021-05-18 MED ORDER — KETAMINE HCL 50 MG/5ML IJ SOSY
50.0000 mg | PREFILLED_SYRINGE | Freq: Once | INTRAMUSCULAR | Status: AC
  Administered 2021-05-18: 50 mg via INTRAVENOUS

## 2021-05-18 MED ORDER — IOHEXOL 9 MG/ML PO SOLN
500.0000 mL | ORAL | Status: AC
  Administered 2021-05-18 (×2): 500 mL via ORAL

## 2021-05-18 MED ORDER — VANCOMYCIN 50 MG/ML ORAL SOLUTION
500.0000 mg | Freq: Four times a day (QID) | ORAL | Status: DC
  Administered 2021-05-19 (×2): 500 mg
  Filled 2021-05-18 (×14): qty 10

## 2021-05-18 MED ORDER — HYDROCORTISONE SOD SUC (PF) 100 MG IJ SOLR
100.0000 mg | Freq: Three times a day (TID) | INTRAMUSCULAR | Status: DC
  Administered 2021-05-18 – 2021-05-19 (×3): 100 mg via INTRAVENOUS
  Filled 2021-05-18 (×3): qty 2

## 2021-05-18 MED ORDER — SODIUM BICARBONATE 8.4 % IV SOLN
INTRAVENOUS | Status: DC
  Filled 2021-05-18 (×3): qty 1000
  Filled 2021-05-18: qty 150

## 2021-05-18 MED ORDER — SODIUM BICARBONATE 8.4 % IV SOLN
INTRAVENOUS | Status: DC
  Filled 2021-05-18: qty 1000

## 2021-05-18 MED ORDER — SODIUM CHLORIDE 0.9 % IV BOLUS
500.0000 mL | Freq: Once | INTRAVENOUS | Status: AC
  Administered 2021-05-18: 500 mL via INTRAVENOUS

## 2021-05-18 MED ORDER — HEPARIN SODIUM (PORCINE) 5000 UNIT/ML IJ SOLN: 5000.0000 [IU] | Freq: Three times a day (TID) | INTRAMUSCULAR | Status: DC

## 2021-05-18 MED ORDER — PHENYLEPHRINE HCL-NACL 20-0.9 MG/250ML-% IV SOLN: 0.0000 ug/min | INTRAVENOUS | Status: DC

## 2021-05-18 MED ORDER — PHENYLEPHRINE CONCENTRATED 100MG/250ML (0.4 MG/ML) INFUSION SIMPLE
0.0000 ug/min | INTRAVENOUS | Status: DC
  Administered 2021-05-18: 300 ug/min via INTRAVENOUS
  Administered 2021-05-18 – 2021-05-19 (×2): 100 ug/min via INTRAVENOUS
  Filled 2021-05-18 (×4): qty 250

## 2021-05-18 MED ORDER — PHENYLEPHRINE HCL-NACL 20-0.9 MG/250ML-% IV SOLN
0.0000 ug/min | INTRAVENOUS | Status: DC
  Administered 2021-05-18: 50 ug/min via INTRAVENOUS
  Filled 2021-05-18: qty 250

## 2021-05-18 MED ORDER — VANCOMYCIN HCL 500 MG IV SOLR
500.0000 mg | Freq: Four times a day (QID) | Status: DC
  Administered 2021-05-18 – 2021-05-19 (×3): 500 mg via RECTAL
  Filled 2021-05-18 (×5): qty 10

## 2021-05-18 NOTE — H&P (Signed)
Name: Terri Bender MRN: [id number] DOB: 27-Mar-1966     CONSULTATION DATE: 05/30/2021  REFERRING MD :  Leslye Peer  CHIEF COMPLAINT:  Shock  STUDIES:  CT with colitis   HISTORY OF PRESENT ILLNESS:   56 yo WF from SNF for remote CVA with L hemiplegia,  developed abdominal pain progressive over 3-4 weeks and becoming severe. The pain was worse on the left side and became associated with both vomiting and watery diarrhea without melena as well as fever and chills. Unfortunately, she can provide no other history, as she arrived from the medicine floor to the ICU stuporous and hemodynamically unstable as a rapid response call. She required immediate intubation and resuscitative efforts.   PAST MEDICAL HISTORY :   has a past medical history of Anxiety, Depression, Frequent headaches, History of chicken pox, Hypertension, and Stroke ([place]).  has a past surgical history that includes Cesarean section; Foot surgery (Left); and LOOP RECORDER INSERTION (N/A, 09/04/2017). Prior to Admission medications   Medication Sig Start Date End Date Taking? Authorizing Provider  acetaminophen (TYLENOL) 325 MG tablet Take 650 mg by mouth every 6 (six) hours as needed for mild pain.   Yes [provider]  aspirin EC 325 MG EC tablet Take 1 tablet (325 mg total) by mouth daily. 05/15/17  Yes Henreitta Leber, MD  atorvastatin (LIPITOR) 40 MG tablet Take 40 mg by mouth daily. 05/11/21  Yes [provider]  Baclofen 5 MG TABS Take 5 mg by mouth daily. 06/16/20  Yes [provider]  cholecalciferol (VITAMIN D) 25 MCG (1000 UNIT) tablet Take 1,000 Units by mouth daily. 02/01/20  Yes [provider]  clonazePAM (KLONOPIN) 0.5 MG tablet Take 1 tablet (0.5 mg total) by mouth 2 (two) times daily as needed for anxiety. Patient taking differently: Take 0.5 mg by mouth 3 (three) times daily as needed for anxiety. 03/20/17  Yes Elby Beck, FNP  hydroxypropyl methylcellulose / hypromellose  (ISOPTO TEARS / GONIOVISC) 2.5 % ophthalmic solution Place 2 drops into both eyes 4 (four) times daily.   Yes [provider]  levothyroxine (SYNTHROID, LEVOTHROID) 25 MCG tablet Take 25 mcg by mouth daily before breakfast.   Yes [provider]  Lidocaine 4 % PTCH Apply 1 patch topically daily.   Yes [provider]  Magnesium 250 MG TABS Take 250 mg by mouth at bedtime.   Yes [provider]  Menthol, Topical Analgesic, (BIOFREEZE) 4 % GEL Apply 1 application topically 3 (three) times daily as needed.   Yes [provider]  metoprolol succinate (TOPROL-XL) 25 MG 24 hr tablet Take 25 mg by mouth daily.   Yes [provider]  Multiple Vitamins-Minerals (MULTIVITAMIN WITH MINERALS) tablet Take 1 tablet by mouth daily.   Yes [provider]  mupirocin ointment (BACTROBAN) 2 % Apply 1 application topically daily.   Yes [provider]  nortriptyline (PAMELOR) 50 MG capsule Take 50 mg by mouth at bedtime. 11/06/18  Yes [provider]  nystatin (MYCOSTATIN/NYSTOP) powder Apply 1 application topically 3 (three) times daily.   Yes [provider]  oxyCODONE (OXY IR/ROXICODONE) 5 MG immediate release tablet Take 5 mg by mouth every 8 (eight) hours as needed for pain.   Yes [provider]  pantoprazole (PROTONIX) 20 MG tablet Take 20 mg by mouth daily. 06/20/17  Yes [provider]  pregabalin (LYRICA) 100 MG capsule Take 75 mg by mouth 2 (two) times daily. 06/23/19  Yes [provider]  Rimegepant Sulfate (NURTEC) 75 MG TBDP Take 75 mg by mouth every other day.   Yes [provider]  senna (SENOKOT) 8.6 MG tablet Take 1 tablet by mouth at bedtime.   Yes [provider]  Skin Protectants, Misc. (MINERIN CREME) CREA Apply 1 application topically daily.   Yes [provider]  traZODone (DESYREL) 50 MG tablet Take 50 mg by mouth at bedtime. 02/20/18  Yes [provider]  venlafaxine XR (EFFEXOR-XR) 150 MG 24 hr capsule Take 150 mg by mouth at bedtime.   Yes [provider]  venlafaxine XR (EFFEXOR-XR) 75 MG 24 hr capsule Take 75 mg by mouth daily with breakfast.   Yes [provider]  VYVANSE 30 MG capsule Take 30 mg by mouth daily. 06/20/20  Yes [provider]  Jay Schlichter Oil (REFRESH P.M. OP) Apply 1 application to eye at bedtime.   Yes [provider]  acyclovir ointment (ZOVIRAX) 5 % Apply topically. Patient not taking: Reported on 05/21/2021 04/12/20   [provider]  Cholecalciferol 5000 units capsule Take 1 capsule (5,000 Units total) by mouth daily. Patient not taking: Reported on 05/18/2021 03/20/17   Elby Beck, FNP  cyanocobalamin (,VITAMIN B-12,) 1000 MCG/ML injection  02/19/18   [provider]  escitalopram (LEXAPRO) 10 MG tablet Take 1 tablet (10 mg total) by mouth daily. Patient not taking: Reported on 05/05/2021 03/20/17   Elby Beck, FNP  feeding supplement, ENSURE ENLIVE, (ENSURE ENLIVE) LIQD Take 237 mLs by mouth 2 (two) times daily between meals. 05/14/17   Henreitta Leber, MD  Fremanezumab-vfrm (AJOVY) 225 MG/1.5ML SOAJ Inject into the skin. Patient not taking: Reported on 05/13/2021 06/22/19   [provider]  guaifenesin (ROBITUSSIN) 100 MG/5ML syrup Take by mouth. Patient not taking: Reported on 05/14/2021    [provider]  hydrocortisone cream 1 % Apply 1 application topically as directed. 04/05/20   [provider]  Lactobacillus (ACIDOPHILUS) CAPS capsule Take by mouth. Patient not taking: Reported on 05/20/2021    [provider]  levofloxacin (LEVAQUIN) 250 MG tablet Check your outpatient doctor to see if you still need to take this antibiotic. Patient not taking: Reported on 05/10/2021 06/23/20   Enzo Bi, MD  lisinopril (ZESTRIL) 10 MG tablet Hold until outpatient followup due to intermittent low blood  pressure. Patient not taking: Reported on 05/01/2021 06/23/20   Enzo Bi, MD  Melatonin 3 MG TABS Take by mouth. Patient not taking: Reported on 05/11/2021    [provider]  potassium chloride (KLOR-CON) 20 MEQ packet Take 40 mEq by mouth daily for 7 days. 06/23/20 06/30/20  Enzo Bi, MD  sodium chloride 1 g tablet Take by mouth. Patient not taking: Reported on 05/17/2021    [provider]  thiamine 100 MG tablet Take by mouth. Patient not taking: Reported on 05/17/2021    [provider]  torsemide (DEMADEX) 10 MG tablet Hold until outpatient followup due to intermittent low blood pressure. Patient not taking: Reported on 05/03/2021 06/23/20   Enzo Bi, MD  triamcinolone cream (KENALOG) 0.1 % Apply topically.    [provider]  venlafaxine (EFFEXOR) 75 MG tablet Take 75 mg by mouth every morning. Patient not taking: Reported on 05/17/2021 11/06/18   [provider]  Wheat Dextrin (BENEFIBER) POWD Take by mouth. Patient not taking: Reported on 05/30/2021 06/20/17   [provider]   Allergies  Allergen Reactions   Erythromycin Nausea And Vomiting  Septra [Sulfamethoxazole-Trimethoprim] Hives   Sulfamethoxazole Other (See Comments)   Trimethoprim Other (See Comments)    FAMILY HISTORY:  family history includes Diabetes in her paternal grandmother; Heart disease in her father and paternal grandfather. SOCIAL HISTORY:  reports that she has been smoking. She has been smoking an average of .5 packs per day. She has never used smokeless tobacco. She reports current alcohol use. She reports that she does not use drugs.  REVIEW OF SYSTEMS:   Unable to obtain due to critical illness      Estimated body mass index is 30.18 kg/m as calculated from the following:   Height as of this encounter: '5\' 2"'  (1.575 m).   Weight as of this encounter: 74.8 kg.    VITAL SIGNS: Temp:  [97.6 F (36.4 C)-99.1 F (37.3 C)] 97.6 F (36.4 C) (01/19  1046) Pulse Rate:  [52-136] 135 (01/19 1400) Resp:  [17-24] 21 (01/19 1400) BP: (71-120)/(56-95) 73/63 (01/19 1400) SpO2:  [84 %-99 %] 99 % (01/19 1400) Arterial Line BP: (99-114)/(62-85) 114/85 (01/19 1400) FiO2 (%):  [100 %] 100 % (01/19 1126)   I/O last 3 completed shifts: In: 75 [P.O.:600; I.V.:1907.3; IV Piggyback:3092.6] Out: -  Total I/O In: 835.1 [P.O.:240; I.V.:595.1] Out: -    SpO2: 99 % O2 Flow Rate (L/min): 2 L/min FiO2 (%): 100 %   Physical Examination:  GENERAL:critically ill appearing, +resp distress HEAD: Normocephalic, atraumatic.  EYES: Pupils equal, but 23m and sluggish. Mild scleral icterus.  MOUTH: Moist mucosal membrane. NECK: Supple. No JVD.  PULMONARY: +rhonchi, poor aeration and poor excursion  CARDIOVASCULAR: S1 and S2. Tachy Regular rate and rhythm. No murmurs, rubs, or gallops.  GASTROINTESTINAL: generally tender and protuberant, not tense but distended, Clysis catheter in place  MUSCULOSKELETAL: No swelling, clubbing, or edema.  NEUROLOGIC: obtunded, strength not tested  SKIN:intact,warm,dry  I personally reviewed lab work that was obtained in last 24 hrs. CXR./CT Independently reviewed-colitis  MEDICATIONS: I have reviewed all medications and confirmed regimen as documented   CULTURE RESULTS   Recent Results (from the past 240 hour(s))  Gastrointestinal Panel by PCR , Stool     Status: None   Collection Time: 05/15/2021  4:03 AM   Specimen: STOOL  Result Value Ref Range Status   Campylobacter species NOT DETECTED NOT DETECTED Final   Plesimonas shigelloides NOT DETECTED NOT DETECTED Final   Salmonella species NOT DETECTED NOT DETECTED Final   Yersinia enterocolitica NOT DETECTED NOT DETECTED Final   Vibrio species NOT DETECTED NOT DETECTED Final   Vibrio cholerae NOT DETECTED NOT DETECTED Final   Enteroaggregative E coli (EAEC) NOT DETECTED NOT DETECTED Final   Enteropathogenic E coli (EPEC) NOT DETECTED NOT DETECTED Final    Enterotoxigenic E coli (ETEC) NOT DETECTED NOT DETECTED Final   Shiga like toxin producing E coli (STEC) NOT DETECTED NOT DETECTED Final   Shigella/Enteroinvasive E coli (EIEC) NOT DETECTED NOT DETECTED Final   Cryptosporidium NOT DETECTED NOT DETECTED Final   Cyclospora cayetanensis NOT DETECTED NOT DETECTED Final   Entamoeba histolytica NOT DETECTED NOT DETECTED Final   Giardia lamblia NOT DETECTED NOT DETECTED Final   Adenovirus F40/41 NOT DETECTED NOT DETECTED Final   Astrovirus NOT DETECTED NOT DETECTED Final   Norovirus GI/GII NOT DETECTED NOT DETECTED Final   Rotavirus A NOT DETECTED NOT DETECTED Final   Sapovirus (I, II, IV, and V) NOT DETECTED NOT DETECTED Final    Comment: [facility] [address], [place] [address]  C Difficile Quick Screen w PCR reflex     Status: Abnormal   Collection Time: 05/01/2021  3:00 PM   Specimen: STOOL  Result Value Ref Range Status   C Diff antigen POSITIVE (A) NEGATIVE Corrected    Comment: RESULT CALLED TO, READ BACK BY AND VERIFIED WITH: CHELSEA KNIGHT 05/17/21 1549 CORRECTED ON 01/18 AT 1540: PREVIOUSLY REPORTED AS NEGATIVE    C Diff toxin POSITIVE (A) NEGATIVE Corrected    Comment: CHELSEA KNIGHT 05/17/21 1549 CORRECTED ON 01/18 AT 1540: PREVIOUSLY REPORTED AS NEGATIVE    C Diff interpretation   Corrected    Positive for toxigenic C. difficile, active toxin production present.    CommentLevada Schilling 05/17/21 1549 Performed at [facility], [address] [place], [place] [address] CORRECTED ON 01/18 AT 2080: PREVIOUSLY REPORTED AS No C. difficile detected.   Resp Panel by RT-PCR (Flu A&B, Covid) Nasopharyngeal Swab     Status: None   Collection Time: 05/14/2021  4:47 PM   Specimen: Nasopharyngeal Swab; Nasopharyngeal(NP) swabs in vial transport medium  Result Value Ref Range Status   SARS Coronavirus 2 by RT PCR NEGATIVE NEGATIVE Final    Comment: (NOTE) SARS-CoV-2 target nucleic acids are  NOT DETECTED.  The SARS-CoV-2 RNA is generally detectable in upper respiratory specimens during the acute phase of infection. The lowest concentration of SARS-CoV-2 viral copies this assay can detect is 138 copies/mL. A negative result does not preclude SARS-Cov-2 infection and should not be used as the sole basis for treatment or other patient management decisions. A negative result may occur with  improper specimen collection/handling, submission of specimen other than nasopharyngeal swab, presence of viral mutation(s) within the areas targeted by this assay, and inadequate number of viral copies(<138 copies/mL). A negative result must be combined with clinical observations, patient history, and epidemiological information. The expected result is Negative.  Fact Sheet for Patients:  EntrepreneurPulse.com.au  Fact Sheet for Healthcare Providers:  IncredibleEmployment.be  This test is no t yet approved or cleared by the [place] FDA and  has been authorized for detection and/or diagnosis of SARS-CoV-2 by FDA under an Emergency Use Authorization (EUA). This EUA will remain  in effect (meaning this test can be used) for the duration of the COVID-19 declaration under Section 564(b)(1) of the Act, 21 U.S.C.section 360bbb-3(b)(1), unless the authorization is terminated  or revoked sooner.       Influenza A by PCR NEGATIVE NEGATIVE Final   Influenza B by PCR NEGATIVE NEGATIVE Final    Comment: (NOTE) The Xpert Xpress SARS-CoV-2/FLU/RSV plus assay is intended as an aid in the diagnosis of influenza from Nasopharyngeal swab specimens and should not be used as a sole basis for treatment. Nasal washings and aspirates are unacceptable for Xpert Xpress SARS-CoV-2/FLU/RSV testing.  Fact Sheet for Patients: EntrepreneurPulse.com.au  Fact Sheet for Healthcare Providers: IncredibleEmployment.be  This test is not  yet approved or cleared by the [place] FDA and has been authorized for detection and/or diagnosis of SARS-CoV-2 by FDA under an Emergency Use Authorization (EUA). This EUA will remain in effect (meaning this test can be used) for the duration of the COVID-19 declaration under Section 564(b)(1) of the Act, 21 U.S.C. section 360bbb-3(b)(1), unless the authorization is terminated or revoked.  Performed at [facility], [address] [place], [place] [address]   Blood Culture (routine x 2)     Status: Abnormal   Collection Time: 05/03/2021  4:47 PM   Specimen: BLOOD  Result Value  Ref Range Status   Specimen Description   Final    BLOOD BLOOD LEFT HAND Performed at [facility], [address] [place], [place] [address]    Special Requests   Final    BOTTLES DRAWN AEROBIC AND ANAEROBIC Blood Culture results may not be optimal due to an inadequate volume of blood received in culture bottles Performed at [facility], [address] [place], [place] [address]    Culture  Setup Time   Final    GRAM POSITIVE COCCI AEROBIC BOTTLE ONLY Organism ID to follow CRITICAL RESULT CALLED TO, READ BACK BY AND VERIFIED WITH: BRANDON BEERS 05/17/21 1759 MU    Culture (A)  Final    STAPHYLOCOCCUS HOMINIS THE SIGNIFICANCE OF ISOLATING THIS ORGANISM FROM A SINGLE SET OF BLOOD CULTURES WHEN MULTIPLE SETS ARE DRAWN IS UNCERTAIN. PLEASE NOTIFY THE MICROBIOLOGY DEPARTMENT WITHIN ONE WEEK IF SPECIATION AND SENSITIVITIES ARE REQUIRED. Performed at [facility], [place] [address], [place], [place] [address]    Report Status 05/18/2021 FINAL  Final  Blood Culture ID Panel (Reflexed)     Status: Abnormal   Collection Time: 05/01/2021  4:47 PM  Result Value Ref Range Status   Enterococcus faecalis NOT DETECTED NOT DETECTED Final   Enterococcus Faecium NOT DETECTED NOT DETECTED Final   Listeria monocytogenes NOT DETECTED NOT DETECTED Final   Staphylococcus species DETECTED  (A) NOT DETECTED Final    Comment: CRITICAL RESULT CALLED TO, READ BACK BY AND VERIFIED WITH: BRANDON BEERS 05/17/21 1759 MU    Staphylococcus aureus (BCID) NOT DETECTED NOT DETECTED Final   Staphylococcus epidermidis NOT DETECTED NOT DETECTED Final   Staphylococcus lugdunensis NOT DETECTED NOT DETECTED Final   Streptococcus species NOT DETECTED NOT DETECTED Final   Streptococcus agalactiae NOT DETECTED NOT DETECTED Final   Streptococcus pneumoniae NOT DETECTED NOT DETECTED Final   Streptococcus pyogenes NOT DETECTED NOT DETECTED Final   A.calcoaceticus-baumannii NOT DETECTED NOT DETECTED Final   Bacteroides fragilis NOT DETECTED NOT DETECTED Final   Enterobacterales NOT DETECTED NOT DETECTED Final   Enterobacter cloacae complex NOT DETECTED NOT DETECTED Final   Escherichia coli NOT DETECTED NOT DETECTED Final   Klebsiella aerogenes NOT DETECTED NOT DETECTED Final   Klebsiella oxytoca NOT DETECTED NOT DETECTED Final   Klebsiella pneumoniae NOT DETECTED NOT DETECTED Final   Proteus species NOT DETECTED NOT DETECTED Final   Salmonella species NOT DETECTED NOT DETECTED Final   Serratia marcescens NOT DETECTED NOT DETECTED Final   Haemophilus influenzae NOT DETECTED NOT DETECTED Final   Neisseria meningitidis NOT DETECTED NOT DETECTED Final   Pseudomonas aeruginosa NOT DETECTED NOT DETECTED Final   Stenotrophomonas maltophilia NOT DETECTED NOT DETECTED Final   Candida albicans NOT DETECTED NOT DETECTED Final   Candida auris NOT DETECTED NOT DETECTED Final   Candida glabrata NOT DETECTED NOT DETECTED Final   Candida krusei NOT DETECTED NOT DETECTED Final   Candida parapsilosis NOT DETECTED NOT DETECTED Final   Candida tropicalis NOT DETECTED NOT DETECTED Final   Cryptococcus neoformans/gattii NOT DETECTED NOT DETECTED Final    Comment: Performed at [facility], [address] [place], [place] [address]  Blood Culture (routine x 2)     Status: None (Preliminary result)    Collection Time: 05/24/2021  4:48 PM   Specimen: BLOOD  Result Value Ref Range Status   Specimen Description BLOOD BLOOD LEFT FOREARM  Final   Special Requests   Final    BOTTLES DRAWN AEROBIC AND ANAEROBIC Blood  Culture results may not be optimal due to an inadequate volume of blood received in culture bottles   Culture   Final    NO GROWTH 2 DAYS Performed at [facility], [address] [place], [place] [address]    Report Status PENDING  Incomplete  Urine Culture     Status: Abnormal   Collection Time: 05/29/2021  6:37 PM   Specimen: In/Out Cath Urine  Result Value Ref Range Status   Specimen Description   Final    IN/OUT CATH URINE Performed at [facility], [address], [place],  [address]    Special Requests   Final    NONE Performed at [facility], [address] [place],  [address]    Culture MULTIPLE SPECIES PRESENT, SUGGEST RECOLLECTION (A)  Final   Report Status 05/18/2021 FINAL  Final          IMAGING    DG Chest Port 1 View  Result Date: 05/18/2021 CLINICAL DATA:  Encounter for central line placement. EXAM: PORTABLE CHEST 1 VIEW COMPARISON:  Chest radiograph dated May 16, 2021 FINDINGS: Endotracheal tube with distal tip at the level of clavicular heads. Feeding tube coursing below the diaphragm, however side port at the level of the GE junction. Right IJ access central line with distal tip at the cavoatrial junction. Low lung volumes. Loop recorder overlying the left hemithorax. No acute osseous abnormality. IMPRESSION: 1. Feeding tube with side port at the level of the GE junction, it needs to be advanced 4-5 cm. 2. Right IJ access central line with distal tip at the cavoatrial junction. Endotracheal tube in satisfactory position. 3. Low lung volumes with bibasilar atelectasis. Electronically Signed   By: Keane Police D.O.   On: 05/18/2021 14:06   DG ABD ACUTE 2+V W 1V CHEST  Result Date: 05/18/2021 CLINICAL DATA:   Abdominal pain and distention EXAM: DG ABDOMEN ACUTE WITH 1 VIEW CHEST COMPARISON:  CT done on 05/01/2021 FINDINGS: Cardiac size is within normal limits. Increased markings are seen in the left lower lung fields. There is poor inspiration. There are no signs of pulmonary edema. There is no pleural effusion or pneumothorax. Tip of endotracheal tube is 2 cm above the carina. Tip of enteric tube is seen in the fundus of the stomach. Side-port in the enteric tube is noted in the distal thoracic esophagus. There is moderate dilation of small-bowel loops measuring up to 4.5 cm. There is moderate gaseous distention of stomach. Gas is present in colon. Pelvis is not included in the images. IMPRESSION: Small patchy infiltrates are seen in the left lower lung fields suggesting atelectasis/pneumonia. Side-port in the enteric tube is seen in the lower thoracic esophagus. Enteric tube could be advanced 5-10 cm to place the side port within the stomach. There is moderate gaseous distention of small-bowel loops suggesting ileus. Electronically Signed   By: Elmer Picker M.D.   On: 05/18/2021 13:27     Nutrition Status:           Indwelling Urinary Catheter continued, requirement due to   Reason to continue Indwelling Urinary Catheter strict Intake/Output monitoring for hemodynamic instability   Central Line/ continued, requirement due to  Reason to continue [place] of central venous pressure or other hemodynamic parameters and poor IV access   Ventilator continued, requirement due to severe respiratory failure   Ventilator Sedation RASS 0 to -2      ASSESSMENT AND PLAN SYNOPSIS   Severe ACUTE Hypoxic  and Hypercapnic Respiratory Failure -continue Full MV support -continue Bronchodilator Therapy -Wean Fio2 and PEEP as tolerated -will perform SAT/SBT when respiratory parameters are met -VAP/VENT bundle implementation  Morbid obesity, possible OSA.   Will certainly impact  respiratory mechanics, ventilator weaning Suspect will need to consider additional PEEP, possible extubation to BiPAP when appropriate to consider   NEUROLOGY - intubated and sedated - minimal sedation to achieve a RASS goal: -1 Wake up assessment pending Acute toxic metabolic encephalopathy, need for sedation Goal RASS -2 to -3   SHOCK-SEPSIS/HYPOVOLUMIC/CARDIOGENIC -use vasopressors to keep MAP>65 -follow ABG and LA -follow up cultures -emperic ABX -consider stress dose steroids -aggressive IV fluid resuscitation -treatment of fulminant C diff: Vanc PO, Flagyl IV, Vanc enemas -GI to see, updated CT pending for mega-colon -may need surgery   CARDIAC ICU monitoring Shock state, volume and neo for map 60-65  ID -as above  -continue IV abx as prescibed -follow up cultures  GI GI PROPHYLAXIS as indicated  NUTRITIONAL STATUS DIET-->TF's as tolerated Constipation protocol as indicated   ENDO - will use ICU hypoglycemic\Hyperglycemia protocol if needed    ELECTROLYTES -follow labs as needed -replace as needed -pharmacy consultation and following    DVT/GI PRX ordered and assessed TRANSFUSIONS AS NEEDED MONITOR FSBS I Assessed the need for Labs I Assessed the need for Foley I Assessed the need for Central Venous Line Family Discussion when available I Assessed the need for Mobilization I made an Assessment of medications to be adjusted accordingly Safety Risk assessment Completed  CASE DISCUSSED IN MULTIDISCIPLINARY ROUNDS WITH ICU TEAM   Critical Care Time devoted to patient care services described in this note is 65 minutes.   Overall, patient is critically ill, prognosis is guarded.  Patient with Multiorgan failure and at high risk for cardiac arrest and death.   Andrik Sandt

## 2021-05-18 NOTE — Progress Notes (Addendum)
Lab called with a critical lab for pt of calcium of 5.7 and glucose at 955, notified Dr. B, will continue to monitor.

## 2021-05-18 NOTE — Progress Notes (Signed)
Patient ID: Terri Bender, female   DOB: 1965/07/04, 56 y.o.   MRN: [id number] [facility] PROGRESS NOTE  Terri Bender [id number] DOB: 1965-07-11 DOA: 05/24/2021 PCP: Alvester Morin, MD  HPI/Subjective: Called to a rapid response with fast heart rate and hypotension with blood pressure in the 50s.  She was less responsive and very cold.  Decision was made to transfer to the ICU.  Fluid bolus ordered.  Patient being treated for C. difficile colitis.  Objective: Vitals:   05/18/21 0755 05/18/21 1046  BP: 117/85 (!) 110/95  Pulse: (!) 136 (!) 131  Resp: 17 18  Temp: 97.8 F (36.6 C) 97.6 F (36.4 C)  SpO2: 96% (!) 86%    Intake/Output Summary (Last 24 hours) at 05/18/2021 1107 Last data filed at 05/18/2021 0754 Gross per 24 hour  Intake 1993.98 ml  Output --  Net 1993.98 ml   Filed Weights   05/20/2021 1631  Weight: 74.8 kg    ROS: Review of Systems  Unable to perform ROS: Acuity of condition  Exam: Physical Exam HENT:     Head: Normocephalic.  Eyes:     General: Lids are normal.     Conjunctiva/sclera: Conjunctivae normal.     Pupils:     Right eye: Pupil is sluggish.     Left eye: Pupil is sluggish.     Comments: Pupils dilated  Cardiovascular:     Rate and Rhythm: Regular rhythm. Tachycardia present.     Heart sounds: Normal heart sounds, S1 normal and S2 normal.  Pulmonary:     Breath sounds: Examination of the right-lower field reveals decreased breath sounds. Examination of the left-lower field reveals decreased breath sounds. Decreased breath sounds present. No wheezing, rhonchi or rales.  Abdominal:     Palpations: Abdomen is soft.     Tenderness: There is no abdominal tenderness.  Musculoskeletal:     Right lower leg: No swelling.     Left lower leg: No swelling.  Skin:    General: Skin is warm.     Comments: Lower extremity skin mottled. Looks like a subcutaneous catheter in the right lower abdomen  Neurological:     Mental Status:  She is lethargic.      Scheduled Meds:  acidophilus  1 capsule Oral Daily   aspirin  325 mg Oral Daily   atorvastatin  80 mg Oral q1800   baclofen  5 mg Oral QHS   cholecalciferol  1,000 Units Oral Daily   enoxaparin (LOVENOX) injection  40 mg Subcutaneous Q24H   feeding supplement  237 mL Oral BID BM   levothyroxine  25 mcg Oral QAC breakfast   lisdexamfetamine  30 mg Oral Daily   pantoprazole  40 mg Oral Daily   potassium chloride  40 mEq Oral Daily   pregabalin  75 mg Oral QHS   thiamine  100 mg Oral Daily   vancomycin  125 mg Oral QID   venlafaxine XR  150 mg Oral QHS   venlafaxine XR  75 mg Oral Q breakfast   Continuous Infusions:  sodium chloride     sodium chloride      Assessment/Plan:  Clinical sepsis with acute C. difficile pancolitis.  Today with hypotension, tachycardia and acute metabolic encephalopathy.  Fluid bolus given.  Decision to transfer to the ICU was made.  Case discussed with critical care specialist and they proceeded with intubation.  Continue oral vancomycin.  Blood culture likely contaminant with staph hominis.  Fluid bolus and IV  fluids and pressors if needed.  Abdominal flat and upright and chest x-ray ordered.  Labs ordered.  Since the patient was intubated the critical care service will take over. Lactic acidosis secondary to sepsis Leukocytosis secondary to C. difficile colitis Hypokalemia and hyponatremia.  Replaced during the hospital course recheck labs today Elevated liver function test secondary to sepsis Anxiety depression.  Patient was on Klonopin and Effexor and Lexapro Hypothyroidism unspecified on levothyroxine GERD on PPI History of stroke with left-sided hemiparesis on aspirin and atorvastatin Not quite sure why the patient has a subcutaneous catheter in her abdomen.  Likely can come out.        Code Status:     Code Status Orders  (From admission, onward)           Start     Ordered   05/15/2021 2041  Full code   Continuous        05/22/2021 2042           Code Status History     Date Active Date Inactive Code Status Order ID Comments User Context   06/22/2020 2347 06/23/2020 2256 Full Code [id number]  Athena Masse, MD ED   05/11/2017 2130 05/16/2017 1621 Full Code [id number]  Quintella Baton, MD Inpatient      Family Communication: Spoke with her only contact on the computer which is her ex-husband. Disposition Plan: Status is: Inpatient  Case discussed with critical care specialist  Brandun Pinn [facility]  [facility]

## 2021-05-18 NOTE — Progress Notes (Signed)
Chaplain Maggie responded to 246 for Rapid Response. No family present. Pt moved to [place].

## 2021-05-18 NOTE — Plan of Care (Signed)
Patient is intubated using glydoscope with #4 blade. 7.5 MM ET tube placed in the first attempt. Bilateral breath sounds noted. Good color change with Co2 monitor. Tube secured at 22 cm at lip mark

## 2021-05-18 NOTE — TOC Benefit Eligibility Note (Signed)
Patient Teacher, English as a foreign language completed.    The patient is currently admitted and upon discharge could be taking Dificid 200 mg tablets.  The current 10 day co-pay is, $0.00.   The patient is currently admitted and upon discharge could be taking vancomycin 125 mg capsules.  The current 10 day co-pay is, $0.00.   The patient is insured through [employer], [place] Patient Advocate Specialist [place] Patient Advocate Team Direct Number: [phone]  Fax: [phone]

## 2021-05-18 NOTE — Progress Notes (Signed)
Patient ID: Terri Bender, female   DOB: 01/14/1966, 56 y.o.   MRN: [id number]  At 2 hour follow up for yellow mews patient was not responding to questions, pale and cool.  VS: HR 120, BP 70/30, R 22, T 97.8, and oxygen was unable to obtain. Charge nurse notified, rapid response called. Patient transferred to ICU. Attempted to contact friend/family listed, number disconnected. Informed charge nurse.  Lidia Collum, RN

## 2021-05-18 NOTE — Progress Notes (Signed)
°   05/18/21 0755  Vitals  Pulse Rate (!) 136    5 mg IV metoprolol given

## 2021-05-18 NOTE — Plan of Care (Signed)

## 2021-05-18 NOTE — Consult Note (Signed)
Terri Darby, MD [address]  [place]  [place], [place] [address]  Main: [phone]  Fax: [phone] Pager: [phone]   Consultation  Referring Provider:     No ref. provider found Primary Care Physician:  Alvester Morin, MD Primary Gastroenterologist: Althia Forts        Reason for Consultation:     Severe C. difficile colitis  Date of Admission:  05/28/2021 Date of Consultation:  05/18/2021         HPI:   Terri Bender is a 56 y.o. female with history of obesity, hypertension, left hemiplegia and dysarthria from old MCA stroke, on aspirin 325 mg, history of anxiety and depression, history of COVID-19 infection in 05/2020 with mild respiratory symptoms who is admitted on 1/17 from [facility] secondary to fever and altered mental status, tachycardia and hypoxia.  She also reported abdominal bloating, diarrhea, nausea and vomiting.  She presented with significant leukocytosis with 35.6, neutrophilia.  Patient underwent CT head without contrast due to an demented status, no acute intracranial abnormality identified.  She also underwent CT abdomen and pelvis without contrast which revealed pancolitis as well as empirically started on treatment for UTI with broad-spectrum antibiotics IV vancomycin, cefepime and Flagyl.  She had pancultures as well as stool studies on 1/17 came back positive for C. difficile infection, started on oral vancomycin 125 mg every 6 hours yesterday afternoon.  Rapid response was called around 11 AM due to tachycardia, hypotension, worsening of mental status, almost unresponsive, transferred to ICU.  Patient is started on aggressive fluid resuscitation, on vasopressors, switch to oral vancomycin 500 mg every 6 hours, on IV Flagyl.  When the ICU attending notified me for a GI consult, I have also advised to start vancomycin enema 500 mg every 6 hours.  X-ray KUB at the time of rapid response revealed NG tube as well as moderate gaseous  distention of the small bowel loops suggesting ileus.  Persistent leukocytosis 34.2 today, no evidence of AKI, serum albumin 2.7.  Patient has OG tube in place and about 400 mL of brownish fluid has been aspirated.  Patient is unresponsive, on minimal sedation, cold and clammy, bluish discoloration of the extremities.  Patient's mother is bedside who is the power of attorney  Patient is waiting for the CT scan.  Patient's nurse and ICU doc are bedside   NSAIDs: None  Antiplts/Anticoagulants/Anti thrombotics: Aspirin 325 mg for history of stroke  GI Procedures: None  Past Medical History:  Diagnosis Date   Anxiety    Depression    Frequent headaches    History of chicken pox    Hypertension    Stroke [facility])     Past Surgical History:  Procedure Laterality Date   CESAREAN SECTION     FOOT SURGERY Left    bone spur   LOOP RECORDER INSERTION N/A 09/04/2017   Procedure: LOOP RECORDER INSERTION;  Surgeon: Isaias Cowman, MD;  Location: [place] [facility];  Service: Cardiovascular;  Laterality: N/A;    Prior to Admission medications   Medication Sig Start Date End Date Taking? Authorizing Provider  acetaminophen (TYLENOL) 325 MG tablet Take 650 mg by mouth every 6 (six) hours as needed for mild pain.   Yes [provider]  aspirin EC 325 MG EC tablet Take 1 tablet (325 mg total) by mouth daily. 05/15/17  Yes Henreitta Leber, MD  atorvastatin (LIPITOR) 40 MG tablet Take 40 mg by mouth daily. 05/11/21  Yes  [provider]  Baclofen 5 MG TABS Take 5 mg by mouth daily. 06/16/20  Yes [provider]  cholecalciferol (VITAMIN D) 25 MCG (1000 UNIT) tablet Take 1,000 Units by mouth daily. 02/01/20  Yes [provider]  clonazePAM (KLONOPIN) 0.5 MG tablet Take 1 tablet (0.5 mg total) by mouth 2 (two) times daily as needed for anxiety. Patient taking differently: Take 0.5 mg by mouth 3 (three) times daily as needed for anxiety. 03/20/17  Yes Elby Beck, FNP  hydroxypropyl methylcellulose / hypromellose (ISOPTO TEARS / GONIOVISC) 2.5 % ophthalmic solution Place 2 drops into both eyes 4 (four) times daily.   Yes [provider]  levothyroxine (SYNTHROID, LEVOTHROID) 25 MCG tablet Take 25 mcg by mouth daily before breakfast.   Yes [provider]  Lidocaine 4 % PTCH Apply 1 patch topically daily.   Yes [provider]  Magnesium 250 MG TABS Take 250 mg by mouth at bedtime.   Yes [provider]  Menthol, Topical Analgesic, (BIOFREEZE) 4 % GEL Apply 1 application topically 3 (three) times daily as needed.   Yes [provider]  metoprolol succinate (TOPROL-XL) 25 MG 24 hr tablet Take 25 mg by mouth daily.   Yes [provider]  Multiple Vitamins-Minerals (MULTIVITAMIN WITH MINERALS) tablet Take 1 tablet by mouth daily.   Yes [provider]  mupirocin ointment (BACTROBAN) 2 % Apply 1 application topically daily.   Yes [provider]  nortriptyline (PAMELOR) 50 MG capsule Take 50 mg by mouth at bedtime. 11/06/18  Yes [provider]  nystatin (MYCOSTATIN/NYSTOP) powder Apply 1 application topically 3 (three) times daily.   Yes [provider]  oxyCODONE (OXY IR/ROXICODONE) 5 MG immediate release tablet Take 5 mg by mouth every 8 (eight) hours as needed for pain.   Yes [provider]  pantoprazole (PROTONIX) 20 MG tablet Take 20 mg by mouth daily. 06/20/17  Yes [provider]  pregabalin (LYRICA) 100 MG capsule Take 75 mg by mouth 2 (two) times daily. 06/23/19  Yes [provider]  Rimegepant Sulfate (NURTEC) 75 MG TBDP Take 75 mg by mouth every other day.   Yes [provider]  senna (SENOKOT) 8.6 MG tablet Take 1 tablet by mouth at bedtime.   Yes [provider]  Skin Protectants, Misc. (MINERIN CREME) CREA Apply 1 application topically daily.   Yes [provider]  traZODone (DESYREL) 50 MG tablet Take 50 mg  by mouth at bedtime. 02/20/18  Yes [provider]  venlafaxine XR (EFFEXOR-XR) 150 MG 24 hr capsule Take 150 mg by mouth at bedtime.   Yes [provider]  venlafaxine XR (EFFEXOR-XR) 75 MG 24 hr capsule Take 75 mg by mouth daily with breakfast.   Yes [provider]  VYVANSE 30 MG capsule Take 30 mg by mouth daily. 06/20/20  Yes [provider]  Jay Schlichter Oil (REFRESH P.M. OP) Apply 1 application to eye at bedtime.   Yes [provider]  acyclovir ointment (ZOVIRAX) 5 % Apply topically. Patient not taking: Reported on 05/15/2021 04/12/20   [provider]  Cholecalciferol 5000 units capsule Take 1 capsule (5,000 Units total) by mouth daily. Patient not taking: Reported on 05/09/2021 03/20/17   Elby Beck, FNP  cyanocobalamin (,VITAMIN B-12,) 1000 MCG/ML injection  02/19/18   [provider]  escitalopram (LEXAPRO) 10 MG tablet Take 1 tablet (10 mg total) by mouth daily. Patient not taking: Reported on 05/05/2021 03/20/17  Elby Beck, FNP  feeding supplement, ENSURE ENLIVE, (ENSURE ENLIVE) LIQD Take 237 mLs by mouth 2 (two) times daily between meals. 05/14/17   Henreitta Leber, MD  Fremanezumab-vfrm (AJOVY) 225 MG/1.5ML SOAJ Inject into the skin. Patient not taking: Reported on 05/26/2021 06/22/19   [provider]  guaifenesin (ROBITUSSIN) 100 MG/5ML syrup Take by mouth. Patient not taking: Reported on 05/01/2021    [provider]  hydrocortisone cream 1 % Apply 1 application topically as directed. 04/05/20   [provider]  Lactobacillus (ACIDOPHILUS) CAPS capsule Take by mouth. Patient not taking: Reported on 05/28/2021    [provider]  levofloxacin (LEVAQUIN) 250 MG tablet Check your outpatient doctor to see if you still need to take this antibiotic. Patient not taking: Reported on 05/14/2021 06/23/20   Enzo Bi, MD  lisinopril (ZESTRIL) 10 MG tablet Hold until  outpatient followup due to intermittent low blood pressure. Patient not taking: Reported on 05/05/2021 06/23/20   Enzo Bi, MD  Melatonin 3 MG TABS Take by mouth. Patient not taking: Reported on 05/30/2021    [provider]  potassium chloride (KLOR-CON) 20 MEQ packet Take 40 mEq by mouth daily for 7 days. 06/23/20 06/30/20  Enzo Bi, MD  sodium chloride 1 g tablet Take by mouth. Patient not taking: Reported on 05/29/2021    [provider]  thiamine 100 MG tablet Take by mouth. Patient not taking: Reported on 05/02/2021    [provider]  torsemide (DEMADEX) 10 MG tablet Hold until outpatient followup due to intermittent low blood pressure. Patient not taking: Reported on 05/04/2021 06/23/20   Enzo Bi, MD  triamcinolone cream (KENALOG) 0.1 % Apply topically.    [provider]  venlafaxine (EFFEXOR) 75 MG tablet Take 75 mg by mouth every morning. Patient not taking: Reported on 05/17/2021 11/06/18   [provider]  Wheat Dextrin (BENEFIBER) POWD Take by mouth. Patient not taking: Reported on 05/05/2021 06/20/17   [provider]   Current Facility-Administered Medications:    0.9 %  sodium chloride infusion, , Intravenous, Continuous, Wieting, Richard, MD   0.9 %  sodium chloride infusion, 250 mL, Intravenous, Continuous, Nelle Don, MD, Last Rate: 10 mL/hr at 05/18/21 1426, 250 mL at 05/18/21 1426   acetaminophen (TYLENOL) tablet 650 mg, 650 mg, Oral, Q6H PRN **OR** acetaminophen (TYLENOL) suppository 650 mg, 650 mg, Rectal, Q6H PRN, Mansy, Jan A, MD   acidophilus (RISAQUAD) capsule 1 capsule, 1 capsule, Oral, Daily, Mansy, Jan A, MD, 1 capsule at 05/18/21 0834   [START ON 06-18-21] aspirin EC tablet 81 mg, 81 mg, Oral, Daily, Nelle Don, MD   atorvastatin (LIPITOR) tablet 80 mg, 80 mg, Oral, q1800, Mansy, Jan A, MD, 80 mg at 05/17/21 1738   baclofen (LIORESAL) tablet 5 mg, 5 mg, Oral, QHS, Mansy, Jan A, MD, 5 mg at 05/17/21 2109    chlorhexidine gluconate (MEDLINE KIT) (PERIDEX) 0.12 % solution 15 mL, 15 mL, Mouth Rinse, BID, Nelle Don, MD   Chlorhexidine Gluconate Cloth 2 % PADS 6 each, 6 each, Topical, Daily, Nelle Don, MD, 6 each at 05/18/21 1513   cholecalciferol (VITAMIN D) tablet 1,000 Units, 1,000 Units, Oral, Daily, Mansy, Jan A, MD, 1,000 Units at 05/18/21 0835   dextrose 50 % solution 100 mL, 2 ampule, Intravenous, Once, Nelle Don, MD   docusate (COLACE) 50 MG/5ML liquid 100 mg, 100 mg, Per Tube, BID PRN, Nelle Don, MD   enoxaparin (LOVENOX) injection 40 mg, 40 mg, Subcutaneous, Q24H,  Mansy, Jan A, MD, 40 mg at 05/17/21 2109   famotidine (PEPCID) IVPB 20 mg premix, 20 mg, Intravenous, Q12H, Nelle Don, MD   feeding supplement (ENSURE ENLIVE / ENSURE PLUS) liquid 237 mL, 237 mL, Oral, BID BM, Mansy, Jan A, MD, 237 mL at 05/18/21 0833   fentaNYL 2527mg in NS 2544m(1065mml) infusion-PREMIX, 0-400 mcg/hr, Intravenous, Continuous, BreNelle DonD, Last Rate: 15 mL/hr at 05/18/21 1202, 150 mcg/hr at 05/18/21 1202   hydrocortisone sodium succinate (SOLU-CORTEF) 100 MG injection 100 mg, 100 mg, Intravenous, Q8H, BreNelle DonD   iohexol (OMNIPAQUE) 240 MG/ML injection 25 mL, 25 mL, Oral, Q1 Hr x 2, BreNelle DonD   iohexol (OMNIPAQUE) 300 MG/ML solution 100 mL, 100 mL, Intravenous, Once PRN, BreNelle DonD   levothyroxine (SYNTHROID) tablet 25 mcg, 25 mcg, Oral, QAC breakfast, Mansy, Jan A, MD, 25 mcg at 05/18/21 [id number] mouth rinse, 15 mL, Mouth Rinse, 10 times per day, BreNelle DonD, 15 mL at 05/18/21 1635   metroNIDAZOLE (FLAGYL) IVPB 500 mg, 500 mg, Intravenous, Q8H, BreNelle DonD, Last Rate: 100 mL/hr at 05/18/21 1527, 500 mg at 05/18/21 1527   morphine 2 MG/ML injection 2 mg, 2 mg, Intravenous, Q4H PRN, Mansy, Jan A, MD, 2 mg at 05/17/21 1742   norepinephrine (LEVOPHED) 4-5 MG/250ML-% infusion SOLN, , , ,    ondansetron (ZOFRAN) tablet 4 mg, 4 mg,  Oral, Q6H PRN **OR** ondansetron (ZOFRAN) injection 4 mg, 4 mg, Intravenous, Q6H PRN, Mansy, Jan A, MD, 4 mg at 05/17/21 1252   pantoprazole (PROTONIX) EC tablet 40 mg, 40 mg, Oral, Daily, Mansy, Jan A, MD, 40 mg at 05/18/21 [id number] CONCENTRATED 100m25m sodium chloride 0.9% 250mL107m4mg/m24mpremix infusion, 0-400 mcg/min, Intravenous, Titrated, BresciNelle DonLast Rate: 15 mL/hr at 05/18/21 1632, 100 mcg/min at 05/18/21 1632   polyethylene glycol (MIRALAX / GLYCOLAX) packet 17 g, 17 g, Oral, Daily PRN, BresciNelle Don potassium chloride (KLOR-CON) packet 40 mEq, 40 mEq, Oral, Daily, Mansy, Jan A, MD, 40 mEq at 05/18/21 0836   pregabalin (LYRICA) capsule 75 mg, 75 mg, Oral, QHS, Mansy, Jan A, MD, 75 mg at 05/17/21 2109   propofol (DIPRIVAN) 1000 MG/100ML infusion, 5-80 mcg/kg/min, Intravenous, Titrated, BresciNelle DonStopped at 05/18/21 1255   sodium bicarbonate 1 mEq/mL injection, , , ,    sodium bicarbonate 150 mEq in dextrose 5 % 1,150 mL infusion, , Intravenous, Continuous, BresciNelle DonLast Rate: 150 mL/hr at 05/18/21 1433, Infusion Verify at 05/18/21 1433   thiamine tablet 100 mg, 100 mg, Oral, Daily, Mansy, Jan A, MD, 100 mg at 05/18/21 0834   vancomycin (VANCOCIN) 50 mg/mL oral solution SOLN 500 mg, 500 mg, Per Tube, Q6H, BresciNelle Don vancomycin (VANCOCIN) 500 mg in sodium chloride irrigation 0.9 % 100 mL ENEMA, 500 mg, Rectal, Q6H, BresciNelle Don Family History  Problem Relation Age of Onset   Heart disease Father    Diabetes Paternal Grandmother    Heart disease Paternal Grandfather      Social History   Tobacco Use   Smoking status: Every Day    Packs/day: 0.50    Types: Cigarettes   Smokeless tobacco: Never  Vaping Use   Vaping Use: Every day  Substance Use Topics   Alcohol use: Yes    Comment: occasional   Drug use: No    Allergies as of 05/07/2021 - Review Complete 05/27/2021  Allergen  Reaction Noted    Erythromycin Nausea And Vomiting 03/20/2017   Septra [sulfamethoxazole-trimethoprim] Hives 03/20/2017   Sulfamethoxazole Other (See Comments) 06/22/2018   Trimethoprim Other (See Comments) 06/22/2018    Review of Systems:    All systems reviewed and negative except where noted in HPI.   Physical Exam:  Vital signs in last 24 hours: Temp:  [97.6 F (36.4 C)-99.1 F (37.3 C)] 97.6 F (36.4 C) (01/19 1046) Pulse Rate:  [52-136] 135 (01/19 1400) Resp:  [17-24] 21 (01/19 1400) BP: (71-120)/(56-95) 73/63 (01/19 1400) SpO2:  [84 %-99 %] 99 % (01/19 1400) Arterial Line BP: (99-114)/(62-85) 114/85 (01/19 1400) FiO2 (%):  [100 %] 100 % (01/19 1126) Last BM Date: 05/18/21 General: Unresponsive, fixed pupils Head:  Normocephalic and atraumatic. Eyes:   No icterus.   Conjunctiva pink. PERRLA. Ears:  n/a Neck:  Supple; no masses or thyroidomegaly Lungs: Respirations even and unlabored. Lungs clear to auscultation bilaterally.   No wheezes, crackles, or rhonchi.  Intubated Heart: Tachycardia, regular rhythm;  Without murmur, clicks, rubs or gallops Abdomen:  Soft, diffusely distended, no bowel sounds, no appreciable masses or hepatomegaly.  No rebound or guarding.  Rectal:  Not performed. Msk:  Symmetrical without gross deformities.   Extremities:  Without edema, diffuse cyanosis Neurologic: Intubated  LAB RESULTS: CBC Latest Ref Rng & Units 05/17/2021 05/03/2021 06/23/2020  WBC 4.0 - 10.5 K/uL 34.2(H) 35.6(H) 9.6  Hemoglobin 12.0 - 15.0 g/dL 13.4 15.6(H) 14.2  Hematocrit 36.0 - 46.0 % 39.7 46.9(H) 42.3  Platelets 150 - 400 K/uL 331 411(H) 425(H)    BMET BMP Latest Ref Rng & Units 05/18/2021 05/17/2021 05/28/2021  Glucose 70 - 99 mg/dL 955(HH) 102(H) -  BUN 6 - 20 mg/dL 24(H) 13 -  Creatinine 0.44 - 1.00 mg/dL 1.46(H) 0.58 0.74  Sodium 135 - 145 mmol/L 132(L) 132(L) -  Potassium 3.5 - 5.1 mmol/L 4.0 3.6 -  Chloride 98 - 111 mmol/L 102 101 -  CO2 22 - 32 mmol/L 20(L) 20(L) -  Calcium  8.9 - 10.3 mg/dL 5.7(LL) 7.6(L) -    LFT Hepatic Function Latest Ref Rng & Units 05/25/2021 06/22/2020 02/28/2020  Total Protein 6.5 - 8.1 g/dL 7.0 7.5 7.2  Albumin 3.5 - 5.0 g/dL 2.7(L) 3.3(L) 3.7  AST 15 - 41 U/L 48(H) 121(H) 31  ALT 0 - 44 U/L 27 98(H) 38  Alk Phosphatase 38 - 126 U/L 213(H) 123 102  Total Bilirubin 0.3 - 1.2 mg/dL 0.9 1.4(H) 0.7  Bilirubin, Direct 0.0 - 0.2 mg/dL - - -     STUDIES: CT HEAD WO CONTRAST (5MM)  Result Date: 05/03/2021 CLINICAL DATA:  Mental status change, unknown cause EXAM: CT HEAD WITHOUT CONTRAST TECHNIQUE: Contiguous axial images were obtained from the base of the skull through the vertex without intravenous contrast. RADIATION DOSE REDUCTION: This exam was performed according to the departmental dose-optimization program which includes automated exposure control, adjustment of the mA and/or kV according to patient size and/or use of iterative reconstruction technique. COMPARISON:  Ct head 06/22/20 BRAIN: BRAIN Cerebral ventricle sizes are concordant with the degree of cerebral volume loss. Patchy and confluent areas of decreased attenuation are noted throughout the deep and periventricular white matter of the cerebral hemispheres bilaterally, compatible with chronic microvascular ischemic disease. Chronic large right middle cerebral artery distribution infarction. No evidence of large-territorial acute infarction. No parenchymal hemorrhage. No mass lesion. No extra-axial collection. No mass effect or midline shift. No hydrocephalus. Basilar cisterns are patent. Vascular: No hyperdense vessel. Skull:  No acute fracture or focal lesion. Sinuses/Orbits: Paranasal sinuses and mastoid air cells are clear. The orbits are unremarkable. Other: None. IMPRESSION: No acute intracranial abnormality. Electronically Signed   By: Iven Finn M.D.   On: 05/03/2021 19:48   CT ABDOMEN PELVIS W CONTRAST  Result Date: 05/30/2021 CLINICAL DATA:  Abdominal pain, acute,  nonlocalized EXAM: CT ABDOMEN AND PELVIS WITH CONTRAST TECHNIQUE: Multidetector CT imaging of the abdomen and pelvis was performed using the standard protocol following bolus administration of intravenous contrast. RADIATION DOSE REDUCTION: This exam was performed according to the departmental dose-optimization program which includes automated exposure control, adjustment of the mA and/or kV according to patient size and/or use of iterative reconstruction technique. CONTRAST:  130m OMNIPAQUE IOHEXOL 300 MG/ML  SOLN COMPARISON:  None. FINDINGS: Lower chest: Bilateral lower lobe subsegmental atelectasis. Tiny hiatal hernia. Hepatobiliary: No focal liver abnormality. No gallstones, gallbladder wall thickening, or pericholecystic fluid. No biliary dilatation. Pancreas: Diffusely atrophic. No focal lesion. Otherwise normal pancreatic contour. No surrounding inflammatory changes. No main pancreatic ductal dilatation. Spleen: Normal in size without focal abnormality. A splenule is noted. Adrenals/Urinary Tract: No adrenal nodule bilaterally. Bilateral kidneys enhance symmetrically. No hydronephrosis. No hydroureter. The urinary bladder is unremarkable. Stomach/Bowel: Stomach is within normal limits. No evidence of small bowel wall thickening or dilatation. Diffuse large bowel wall thickening, mucosal hyperemia, pericolonic fat stranding. Appendix appears normal. Vascular/Lymphatic: No abdominal aorta or iliac aneurysm. No abdominal, pelvic, or inguinal lymphadenopathy. Reproductive: Uterus and bilateral adnexa are unremarkable. Other: Trace free fluid within the pelvis. No intraperitoneal free gas. No organized fluid collection. Musculoskeletal: Subcutaneus soft tissue emphysema within the right lower anterior abdominal wall likely related to an medication injection. No suspicious lytic or blastic osseous lesions. No acute displaced fracture. Multilevel degenerative changes of the spine. Old healed pelvic fractures.  IMPRESSION: 1. Pancolitis. Differential diagnosis for etiology includes infection, inflammation, less likely ischemia. Consider colonoscopy status post treatment and status post complete resolution of inflammatory changes to exclude an underlying lesion. 2. Subcutaneus soft tissue emphysema within the right lower anterior abdominal wall likely related to an medication injection. Recommend clinical correlation for infection. 3. Tiny hiatal hernia. Electronically Signed   By: MIven FinnM.D.   On: 05/08/2021 19:45   DG Chest Port 1 View  Result Date: 05/18/2021 CLINICAL DATA:  Encounter for central line placement. EXAM: PORTABLE CHEST 1 VIEW COMPARISON:  Chest radiograph dated May 16, 2021 FINDINGS: Endotracheal tube with distal tip at the level of clavicular heads. Feeding tube coursing below the diaphragm, however side port at the level of the GE junction. Right IJ access central line with distal tip at the cavoatrial junction. Low lung volumes. Loop recorder overlying the left hemithorax. No acute osseous abnormality. IMPRESSION: 1. Feeding tube with side port at the level of the GE junction, it needs to be advanced 4-5 cm. 2. Right IJ access central line with distal tip at the cavoatrial junction. Endotracheal tube in satisfactory position. 3. Low lung volumes with bibasilar atelectasis. Electronically Signed   By: IKeane PoliceD.O.   On: 05/18/2021 14:06   DG ABD ACUTE 2+V W 1V CHEST  Result Date: 05/18/2021 CLINICAL DATA:  Abdominal pain and distention EXAM: DG ABDOMEN ACUTE WITH 1 VIEW CHEST COMPARISON:  CT done on 05/02/2021 FINDINGS: Cardiac size is within normal limits. Increased markings are seen in the left lower lung fields. There is poor inspiration. There are no signs of pulmonary edema. There is no pleural effusion or pneumothorax. Tip of  endotracheal tube is 2 cm above the carina. Tip of enteric tube is seen in the fundus of the stomach. Side-port in the enteric tube is noted in the  distal thoracic esophagus. There is moderate dilation of small-bowel loops measuring up to 4.5 cm. There is moderate gaseous distention of stomach. Gas is present in colon. Pelvis is not included in the images. IMPRESSION: Small patchy infiltrates are seen in the left lower lung fields suggesting atelectasis/pneumonia. Side-port in the enteric tube is seen in the lower thoracic esophagus. Enteric tube could be advanced 5-10 cm to place the side port within the stomach. There is moderate gaseous distention of small-bowel loops suggesting ileus. Electronically Signed   By: Elmer Picker M.D.   On: 05/18/2021 13:27      Impression / Plan:   Terri Bender is a 56 y.o. female with metabolic syndrome, history of stroke with left hemiplegia who is admitted from [facility] with altered mental status, abdominal distention, diarrhea, currently in septic shock secondary to severe C. difficile infection  Severe C. difficile infection Patient is unresponsive, in septic shock on high-dose vasopressin Started on vancomycin 500 mg every 6 hours via OG tube and enema.  Patient did not receive high-dose vancomycin yet Continue IV metronidazole 500 mg twice daily Stat CT scan to evaluate for toxic megacolon  Per the ICU attending, Dr. Merrilee Jansky, given guarded prognosis, patient may not survive colectomy if she has toxic megacolon.  He is waiting for a family meeting before general surgery consultation Overall prognosis is guarded, I discussed my recommendations with patient's mother who is well aware of the prognosis and she is waiting for rest of her family members to have a meeting regarding goals of care   Thank you for involving me in the care of this patient.      LOS: 2 days   Sherri Sear, MD  05/18/2021, 4:46 PM    Note: This dictation was prepared with Dragon dictation along with smaller phrase technology. Any transcriptional errors that result from this process are unintentional.

## 2021-05-19 ENCOUNTER — Inpatient Hospital Stay
Admit: 2021-05-19 | Discharge: 2021-05-19 | Disposition: A | Discharge status: Home / Self Care | Payer: Medicare Other | Attending: Pulmonary Disease | Admitting: Pulmonary Disease

## 2021-05-19 DIAGNOSIS — R7989 Other specified abnormal findings of blood chemistry: Secondary | ICD-10-CM

## 2021-05-19 DIAGNOSIS — A0472 Enterocolitis due to Clostridium difficile, not specified as recurrent: Secondary | ICD-10-CM

## 2021-05-19 DIAGNOSIS — K5100 Ulcerative (chronic) pancolitis without complications: Secondary | ICD-10-CM | POA: Diagnosis not present

## 2021-05-19 DIAGNOSIS — E871 Hypo-osmolality and hyponatremia: Secondary | ICD-10-CM

## 2021-05-19 LAB — BASIC METABOLIC PANEL
Anion gap: 10 (ref 5–15)
BUN: 23 mg/dL — ABNORMAL HIGH (ref 6–20)
CO2: 19 mmol/L — ABNORMAL LOW (ref 22–32)
Calcium: 6.6 mg/dL — ABNORMAL LOW (ref 8.9–10.3)
Chloride: 105 mmol/L (ref 98–111)
Creatinine, Ser: 1.42 mg/dL — ABNORMAL HIGH (ref 0.44–1.00)
GFR, Estimated: 44 mL/min — ABNORMAL LOW (ref >60)
Glucose, Bld: 230 mg/dL — ABNORMAL HIGH (ref 70–99)
Potassium: 3.2 mmol/L — ABNORMAL LOW (ref 3.5–5.1)
Sodium: 134 mmol/L — ABNORMAL LOW (ref 135–145)

## 2021-05-19 LAB — BLOOD GAS, ARTERIAL
Acid-base deficit: 3.7 mmol/L — ABNORMAL HIGH (ref 0.0–2.0)
Acid-base deficit: 12.7 mmol/L — ABNORMAL HIGH (ref 0.0–2.0)
Bicarbonate: 18.5 mmol/L — ABNORMAL LOW (ref 20.0–28.0)
Bicarbonate: 12.4 mmol/L — ABNORMAL LOW (ref 20.0–28.0)
FIO2: 100
FIO2: 1
MECHVT: 450 mL
MECHVT: 450 mL
Mechanical Rate: 24
O2 Saturation: 99.2 %
O2 Saturation: 99 %
PEEP: 15 cmH2O
PEEP: 15 cmH2O
Patient temperature: 37
Patient temperature: 37
RATE: 24 resp/min
RATE: 24 resp/min
pCO2 arterial: 26 mmHg — ABNORMAL LOW (ref 32.0–48.0)
pCO2 arterial: 27 mmHg — ABNORMAL LOW (ref 32.0–48.0)
pH, Arterial: 7.46 — ABNORMAL HIGH (ref 7.350–7.450)
pH, Arterial: 7.27 — ABNORMAL LOW (ref 7.350–7.450)
pO2, Arterial: 136 mmHg — ABNORMAL HIGH (ref 83.0–108.0)
pO2, Arterial: 148 mmHg — ABNORMAL HIGH (ref 83.0–108.0)

## 2021-05-19 LAB — CBC
HCT: 45.3 % (ref 36.0–46.0)
Hemoglobin: 15.1 g/dL — ABNORMAL HIGH (ref 12.0–15.0)
MCH: 30.9 pg (ref 26.0–34.0)
MCHC: 33.3 g/dL (ref 30.0–36.0)
MCV: 92.8 fL (ref 80.0–100.0)
Platelets: 312 10*3/uL (ref 150–400)
RBC: 4.88 MIL/uL (ref 3.87–5.11)
RDW: 15.2 % (ref 11.5–15.5)
WBC: 78.4 10*3/uL (ref 4.0–10.5)
nRBC: 1 % — ABNORMAL HIGH (ref 0.0–0.2)

## 2021-05-19 LAB — PHOSPHORUS: Phosphorus: 2.7 mg/dL (ref 2.5–4.6)

## 2021-05-19 LAB — LACTIC ACID, PLASMA: Lactic Acid, Venous: 7.4 mmol/L (ref 0.5–1.9)

## 2021-05-19 LAB — TROPONIN I (HIGH SENSITIVITY): Troponin I (High Sensitivity): 101 ng/L (ref <18)

## 2021-05-19 LAB — GLUCOSE, CAPILLARY
Glucose-Capillary: 305 mg/dL — ABNORMAL HIGH (ref 70–99)
Glucose-Capillary: 182 mg/dL — ABNORMAL HIGH (ref 70–99)
Glucose-Capillary: 138 mg/dL — ABNORMAL HIGH (ref 70–99)

## 2021-05-19 LAB — ECHOCARDIOGRAM COMPLETE
Area-P 1/2: 6.79 cm2
Height: 62.008 in
S' Lateral: 2.49 cm
Weight: 2984.15 oz

## 2021-05-19 LAB — HEMOGLOBIN A1C
Hgb A1c MFr Bld: 5.6 % (ref 4.8–5.6)
Mean Plasma Glucose: 114 mg/dL

## 2021-05-19 LAB — COOXEMETRY PANEL
Carboxyhemoglobin: 1.1 % (ref 0.5–1.5)
Methemoglobin: 0.9 % (ref 0.0–1.5)
O2 Saturation: 100 %

## 2021-05-19 LAB — TRIGLYCERIDES: Triglycerides: 156 mg/dL — ABNORMAL HIGH (ref <150)

## 2021-05-19 LAB — MAGNESIUM: Magnesium: 2.1 mg/dL (ref 1.7–2.4)

## 2021-05-19 LAB — CORTISOL: Cortisol, Plasma: >100 ug/dL

## 2021-05-19 MED ORDER — GLYCOPYRROLATE 0.2 MG/ML IJ SOLN: 0.2000 mg | INTRAMUSCULAR | Status: DC | PRN

## 2021-05-19 MED ORDER — ACETAMINOPHEN 650 MG RE SUPP: 650.0000 mg | Freq: Four times a day (QID) | RECTAL | Status: DC | PRN

## 2021-05-19 MED ORDER — GLYCOPYRROLATE 1 MG PO TABS
1.0000 mg | ORAL_TABLET | ORAL | Status: DC | PRN
  Filled 2021-05-19: qty 1

## 2021-05-19 MED ORDER — POLYVINYL ALCOHOL 1.4 % OP SOLN
1.0000 [drp] | Freq: Four times a day (QID) | OPHTHALMIC | Status: DC | PRN
  Filled 2021-05-19: qty 15

## 2021-05-19 MED ORDER — MORPHINE 100MG IN NS 100ML (1MG/ML) PREMIX INFUSION
5.0000 mg/h | INTRAVENOUS | Status: DC
Start: 2021-05-19 — End: 2021-05-19

## 2021-05-19 MED ORDER — ACETAMINOPHEN 325 MG PO TABS: 650.0000 mg | ORAL_TABLET | Freq: Four times a day (QID) | ORAL | Status: DC | PRN

## 2021-05-19 MED ORDER — ONDANSETRON 4 MG PO TBDP
4.0000 mg | ORAL_TABLET | Freq: Four times a day (QID) | ORAL | Status: DC | PRN
  Filled 2021-05-19: qty 1

## 2021-05-19 MED ORDER — POTASSIUM CHLORIDE 10 MEQ/50ML IV SOLN
10.0000 meq | INTRAVENOUS | Status: AC
  Administered 2021-05-19 (×3): 10 meq via INTRAVENOUS
  Filled 2021-05-19 (×3): qty 50

## 2021-05-19 MED ORDER — BIOTENE DRY MOUTH MT LIQD: 15.0000 mL | OROMUCOSAL | Status: DC | PRN

## 2021-05-19 MED ORDER — SODIUM BICARBONATE 8.4 % IV SOLN
150.0000 meq | Freq: Once | INTRAVENOUS | Status: AC
  Administered 2021-05-19: 150 meq via INTRAVENOUS

## 2021-05-19 MED ORDER — VASOPRESSIN 20 UNITS/100 ML INFUSION FOR SHOCK
0.0000 [IU]/min | INTRAVENOUS | Status: DC
  Administered 2021-05-19: 0.03 [IU]/min via INTRAVENOUS
  Filled 2021-05-19 (×2): qty 100

## 2021-05-19 MED ORDER — VASOPRESSIN 20 UNITS/100 ML INFUSION FOR SHOCK
0.0000 [IU]/min | INTRAVENOUS | Status: DC
  Administered 2021-05-19: 0.03 [IU]/min via INTRAVENOUS
  Filled 2021-05-19: qty 100

## 2021-05-19 MED ORDER — LACTATED RINGERS IV BOLUS
1000.0000 mL | Freq: Once | INTRAVENOUS | Status: AC
  Administered 2021-05-19: 1000 mL via INTRAVENOUS

## 2021-05-19 MED ORDER — ONDANSETRON HCL 4 MG/2ML IJ SOLN: 4.0000 mg | Freq: Four times a day (QID) | INTRAMUSCULAR | Status: DC | PRN

## 2021-05-19 MED ORDER — INSULIN ASPART 100 UNIT/ML IJ SOLN
0.0000 [IU] | INTRAMUSCULAR | Status: DC
  Administered 2021-05-19: 2 [IU] via SUBCUTANEOUS
  Filled 2021-05-19: qty 1

## 2021-05-19 MED ORDER — HYDROMORPHONE HCL 1 MG/ML IJ SOLN: 1.0000 mg | INTRAMUSCULAR | Status: DC | PRN

## 2021-05-20 LAB — URINE CULTURE: Culture: NO GROWTH

## 2021-05-21 LAB — CULTURE, RESPIRATORY W GRAM STAIN

## 2021-05-21 LAB — CULTURE, BLOOD (ROUTINE X 2): Culture: NO GROWTH

## 2021-05-22 LAB — GLUCOSE, CAPILLARY: Glucose-Capillary: 16 mg/dL — CL (ref 70–99)

## 2021-05-23 LAB — CULTURE, BLOOD (ROUTINE X 2)
Culture: NO GROWTH
Special Requests: ADEQUATE

## 2021-05-24 LAB — CULTURE, BLOOD (ROUTINE X 2): Culture: NO GROWTH

## 2021-05-31 NOTE — Progress Notes (Signed)
Pts valuables counted in front of patients brother with [employer], NT verification, 11 bracelets, 5 rings, 2 hairbands.

## 2021-05-31 NOTE — Progress Notes (Addendum)
Name: Terri Bender MRN: [id number] DOB: [birth year]     CONSULTATION DATE: 05/23/2021  REFERRING MD :  Leslye Peer  CHIEF COMPLAINT:  Shock  STUDIES:  CT with colitis   HISTORY OF PRESENT ILLNESS:   56 yo WF from SNF for remote CVA with L hemiplegia,  developed abdominal pain progressive over 3-4 weeks and becoming severe. The pain was worse on the left side and became associated with both vomiting and watery diarrhea without melena as well as fever and chills. Unfortunately, she can provide no other history, as she arrived from the medicine floor to the ICU stuporous and hemodynamically unstable as a rapid response call. She required immediate intubation and resuscitative efforts.   PAST MEDICAL HISTORY :   has a past medical history of Anxiety, Depression, Frequent headaches, History of chicken pox, Hypertension, and Stroke ([place]).  has a past surgical history that includes Cesarean section; Foot surgery (Left); and LOOP RECORDER INSERTION (N/A, 09/04/2017). Prior to Admission medications   Medication Sig Start Date End Date Taking? Authorizing Provider  acetaminophen (TYLENOL) 325 MG tablet Take 650 mg by mouth every 6 (six) hours as needed for mild pain.   Yes [provider]  aspirin EC 325 MG EC tablet Take 1 tablet (325 mg total) by mouth daily. 05/15/17  Yes Henreitta Leber, MD  atorvastatin (LIPITOR) 40 MG tablet Take 40 mg by mouth daily. 05/11/21  Yes [provider]  Baclofen 5 MG TABS Take 5 mg by mouth daily. 06/16/20  Yes [provider]  cholecalciferol (VITAMIN D) 25 MCG (1000 UNIT) tablet Take 1,000 Units by mouth daily. 02/01/20  Yes [provider]  clonazePAM (KLONOPIN) 0.5 MG tablet Take 1 tablet (0.5 mg total) by mouth 2 (two) times daily as needed for anxiety. Patient taking differently: Take 0.5 mg by mouth 3 (three) times daily as needed for anxiety. 03/20/17  Yes Elby Beck, FNP  hydroxypropyl methylcellulose / hypromellose  (ISOPTO TEARS / GONIOVISC) 2.5 % ophthalmic solution Place 2 drops into both eyes 4 (four) times daily.   Yes [provider]  levothyroxine (SYNTHROID, LEVOTHROID) 25 MCG tablet Take 25 mcg by mouth daily before breakfast.   Yes [provider]  Lidocaine 4 % PTCH Apply 1 patch topically daily.   Yes [provider]  Magnesium 250 MG TABS Take 250 mg by mouth at bedtime.   Yes [provider]  Menthol, Topical Analgesic, (BIOFREEZE) 4 % GEL Apply 1 application topically 3 (three) times daily as needed.   Yes [provider]  metoprolol succinate (TOPROL-XL) 25 MG 24 hr tablet Take 25 mg by mouth daily.   Yes [provider]  Multiple Vitamins-Minerals (MULTIVITAMIN WITH MINERALS) tablet Take 1 tablet by mouth daily.   Yes [provider]  mupirocin ointment (BACTROBAN) 2 % Apply 1 application topically daily.   Yes [provider]  nortriptyline (PAMELOR) 50 MG capsule Take 50 mg by mouth at bedtime. 11/06/18  Yes [provider]  nystatin (MYCOSTATIN/NYSTOP) powder Apply 1 application topically 3 (three) times daily.   Yes [provider]  oxyCODONE (OXY IR/ROXICODONE) 5 MG immediate release tablet Take 5 mg by mouth every 8 (eight) hours as needed for pain.   Yes [provider]  pantoprazole (PROTONIX) 20 MG tablet Take 20 mg by mouth daily. 06/20/17  Yes [provider]  pregabalin (LYRICA) 100 MG capsule Take 75 mg by mouth 2 (two) times daily. 06/23/19  Yes [provider]  Rimegepant Sulfate (NURTEC) 75 MG TBDP Take 75 mg by mouth every other day.   Yes [provider]  senna (SENOKOT) 8.6 MG tablet Take 1 tablet by mouth at bedtime.   Yes [provider]  Skin Protectants, Misc. (MINERIN CREME) CREA Apply 1 application topically daily.   Yes [provider]  traZODone (DESYREL) 50 MG tablet Take 50 mg by mouth at bedtime. 02/20/18  Yes [provider]  venlafaxine XR (EFFEXOR-XR) 150 MG 24 hr capsule Take 150 mg by mouth at bedtime.   Yes [provider]  venlafaxine XR (EFFEXOR-XR) 75 MG 24 hr capsule Take 75 mg by mouth daily with breakfast.   Yes [provider]  VYVANSE 30 MG capsule Take 30 mg by mouth daily. 06/20/20  Yes [provider]  Jay Schlichter Oil (REFRESH P.M. OP) Apply 1 application to eye at bedtime.   Yes [provider]  acyclovir ointment (ZOVIRAX) 5 % Apply topically. Patient not taking: Reported on 05/17/2021 04/12/20   [provider]  Cholecalciferol 5000 units capsule Take 1 capsule (5,000 Units total) by mouth daily. Patient not taking: Reported on 05/21/2021 03/20/17   Elby Beck, FNP  cyanocobalamin (,VITAMIN B-12,) 1000 MCG/ML injection  02/19/18   [provider]  escitalopram (LEXAPRO) 10 MG tablet Take 1 tablet (10 mg total) by mouth daily. Patient not taking: Reported on 04/30/2021 03/20/17   Elby Beck, FNP  feeding supplement, ENSURE ENLIVE, (ENSURE ENLIVE) LIQD Take 237 mLs by mouth 2 (two) times daily between meals. 05/14/17   Henreitta Leber, MD  Fremanezumab-vfrm (AJOVY) 225 MG/1.5ML SOAJ Inject into the skin. Patient not taking: Reported on 05/17/2021 06/22/19   [provider]  guaifenesin (ROBITUSSIN) 100 MG/5ML syrup Take by mouth. Patient not taking: Reported on 05/18/2021    [provider]  hydrocortisone cream 1 % Apply 1 application topically as directed. 04/05/20   [provider]  Lactobacillus (ACIDOPHILUS) CAPS capsule Take by mouth. Patient not taking: Reported on 05/01/2021    [provider]  levofloxacin (LEVAQUIN) 250 MG tablet Check your outpatient doctor to see if you still need to take this antibiotic. Patient not taking: Reported on 05/21/2021 06/23/20   Enzo Bi, MD  lisinopril (ZESTRIL) 10 MG tablet Hold until outpatient followup due to intermittent low blood  pressure. Patient not taking: Reported on 05/11/2021 06/23/20   Enzo Bi, MD  Melatonin 3 MG TABS Take by mouth. Patient not taking: Reported on 05/24/2021    [provider]  potassium chloride (KLOR-CON) 20 MEQ packet Take 40 mEq by mouth daily for 7 days. 06/23/20 06/30/20  Enzo Bi, MD  sodium chloride 1 g tablet Take by mouth. Patient not taking: Reported on 05/24/2021    [provider]  thiamine 100 MG tablet Take by mouth. Patient not taking: Reported on 05/14/2021    [provider]  torsemide (DEMADEX) 10 MG tablet Hold until outpatient followup due to intermittent low blood pressure. Patient not taking: Reported on 05/14/2021 06/23/20   Enzo Bi, MD  triamcinolone cream (KENALOG) 0.1 % Apply topically.    [provider]  venlafaxine (EFFEXOR) 75 MG tablet Take 75 mg by mouth every morning. Patient not taking: Reported on 05/17/2021 11/06/18   [provider]  Wheat Dextrin (BENEFIBER) POWD Take by mouth. Patient not taking: Reported on 05/25/2021 06/20/17   [provider]   Allergies  Allergen Reactions   Erythromycin Nausea And Vomiting  Septra [Sulfamethoxazole-Trimethoprim] Hives   Sulfamethoxazole Other (See Comments)   Trimethoprim Other (See Comments)    FAMILY HISTORY:  family history includes Diabetes in her paternal grandmother; Heart disease in her father and paternal grandfather. SOCIAL HISTORY:  reports that she has been smoking. She has been smoking an average of .5 packs per day. She has never used smokeless tobacco. She reports current alcohol use. She reports that she does not use drugs.  REVIEW OF SYSTEMS:   Unable to obtain due to critical illness      Estimated body mass index is 34.1 kg/m as calculated from the following:   Height as of this encounter: 5' 2.01" (1.575 m).   Weight as of this encounter: 84.6 kg.    VITAL SIGNS: Temp:  [95.7 F (35.4 C)-99.7 F (37.6 C)] 99.7 F (37.6 C) (01/20  1100) Pulse Rate:  [52-145] 145 (01/20 1100) Resp:  [18-27] 24 (01/20 1100) BP: (68-130)/(50-105) 88/55 (01/20 1100) SpO2:  [84 %-99 %] 95 % (01/20 1100) Arterial Line BP: (87-135)/(51-85) 93/51 (01/20 1100) FiO2 (%):  [100 %] 100 % (01/20 1100) Weight:  [84.6 kg] 84.6 kg (01/20 0500)   I/O last 3 completed shifts: In: 6683.6 [P.O.:840; I.V.:4112.9; Other:300; NG/GT:50; IV Piggyback:1380.7] Out: 1240 [Urine:240; Emesis/NG output:800; Stool:200] Total I/O In: 185.7 [I.V.:87.3; IV Piggyback:98.4] Out: -    SpO2: 95 % O2 Flow Rate (L/min): 2 L/min FiO2 (%): 100 %   Physical Examination:  GENERAL:critically ill appearing, +resp distress HEAD: Normocephalic, atraumatic.  EYES: Pupils equal, but 68m and sluggish. Mild scleral icterus.  MOUTH: Moist mucosal membrane. NECK: Supple. No JVD.  PULMONARY: +rhonchi, poor aeration and poor excursion  CARDIOVASCULAR: S1 and S2. Tachy Regular rate and rhythm. No murmurs, rubs, or gallops.  GASTROINTESTINAL: generally tender and protuberant, not tense but distended, Clysis catheter in place  MUSCULOSKELETAL: No swelling, clubbing, or edema.  NEUROLOGIC: obtunded, strength not tested  SKIN:intact,warm,dry  I personally reviewed lab work that was obtained in last 24 hrs. CXR./CT Independently reviewed-colitis  MEDICATIONS: I have reviewed all medications and confirmed regimen as documented   CULTURE RESULTS   Recent Results (from the past 240 hour(s))  Gastrointestinal Panel by PCR , Stool     Status: None   Collection Time: 05/17/2021  4:03 AM   Specimen: STOOL  Result Value Ref Range Status   Campylobacter species NOT DETECTED NOT DETECTED Final   Plesimonas shigelloides NOT DETECTED NOT DETECTED Final   Salmonella species NOT DETECTED NOT DETECTED Final   Yersinia enterocolitica NOT DETECTED NOT DETECTED Final   Vibrio species NOT DETECTED NOT DETECTED Final   Vibrio cholerae NOT DETECTED NOT DETECTED Final   Enteroaggregative E  coli (EAEC) NOT DETECTED NOT DETECTED Final   Enteropathogenic E coli (EPEC) NOT DETECTED NOT DETECTED Final   Enterotoxigenic E coli (ETEC) NOT DETECTED NOT DETECTED Final   Shiga like toxin producing E coli (STEC) NOT DETECTED NOT DETECTED Final   Shigella/Enteroinvasive E coli (EIEC) NOT DETECTED NOT DETECTED Final   Cryptosporidium NOT DETECTED NOT DETECTED Final   Cyclospora cayetanensis NOT DETECTED NOT DETECTED Final   Entamoeba histolytica NOT DETECTED NOT DETECTED Final   Giardia lamblia NOT DETECTED NOT DETECTED Final   Adenovirus F40/41 NOT DETECTED NOT DETECTED Final   Astrovirus NOT DETECTED NOT DETECTED Final   Norovirus GI/GII NOT DETECTED NOT DETECTED Final   Rotavirus A NOT DETECTED NOT DETECTED Final   Sapovirus (I, II, IV, and V) NOT DETECTED NOT DETECTED Final  Comment: Performed at [facility], [address] [place], [place] [address]  C Difficile Quick Screen w PCR reflex     Status: Abnormal   Collection Time: 05/27/2021  3:00 PM   Specimen: STOOL  Result Value Ref Range Status   C Diff antigen POSITIVE (A) NEGATIVE Corrected    Comment: RESULT CALLED TO, READ BACK BY AND VERIFIED WITH: CHELSEA KNIGHT 05/17/21 1549 CORRECTED ON 01/18 AT 1540: PREVIOUSLY REPORTED AS NEGATIVE    C Diff toxin POSITIVE (A) NEGATIVE Corrected    Comment: CHELSEA KNIGHT 05/17/21 1549 CORRECTED ON 01/18 AT 1540: PREVIOUSLY REPORTED AS NEGATIVE    C Diff interpretation   Corrected    Positive for toxigenic C. difficile, active toxin production present.    CommentLevada Schilling 05/17/21 1549 Performed at [facility], [address] [place], [place] [address] CORRECTED ON 01/18 AT 5462: PREVIOUSLY REPORTED AS No C. difficile detected.   Resp Panel by RT-PCR (Flu A&B, Covid) Nasopharyngeal Swab     Status: None   Collection Time: 05/26/2021  4:47 PM   Specimen: Nasopharyngeal Swab; Nasopharyngeal(NP) swabs in vial transport medium  Result Value Ref Range Status    SARS Coronavirus 2 by RT PCR NEGATIVE NEGATIVE Final    Comment: (NOTE) SARS-CoV-2 target nucleic acids are NOT DETECTED.  The SARS-CoV-2 RNA is generally detectable in upper respiratory specimens during the acute phase of infection. The lowest concentration of SARS-CoV-2 viral copies this assay can detect is 138 copies/mL. A negative result does not preclude SARS-Cov-2 infection and should not be used as the sole basis for treatment or other patient management decisions. A negative result may occur with  improper specimen collection/handling, submission of specimen other than nasopharyngeal swab, presence of viral mutation(s) within the areas targeted by this assay, and inadequate number of viral copies(<138 copies/mL). A negative result must be combined with clinical observations, patient history, and epidemiological information. The expected result is Negative.  Fact Sheet for Patients:  EntrepreneurPulse.com.au  Fact Sheet for Healthcare Providers:  IncredibleEmployment.be  This test is no t yet approved or cleared by the [place] FDA and  has been authorized for detection and/or diagnosis of SARS-CoV-2 by FDA under an Emergency Use Authorization (EUA). This EUA will remain  in effect (meaning this test can be used) for the duration of the COVID-19 declaration under Section 564(b)(1) of the Act, 21 U.S.C.section 360bbb-3(b)(1), unless the authorization is terminated  or revoked sooner.       Influenza A by PCR NEGATIVE NEGATIVE Final   Influenza B by PCR NEGATIVE NEGATIVE Final    Comment: (NOTE) The Xpert Xpress SARS-CoV-2/FLU/RSV plus assay is intended as an aid in the diagnosis of influenza from Nasopharyngeal swab specimens and should not be used as a sole basis for treatment. Nasal washings and aspirates are unacceptable for Xpert Xpress SARS-CoV-2/FLU/RSV testing.  Fact Sheet for  Patients: EntrepreneurPulse.com.au  Fact Sheet for Healthcare Providers: IncredibleEmployment.be  This test is not yet approved or cleared by the [place] FDA and has been authorized for detection and/or diagnosis of SARS-CoV-2 by FDA under an Emergency Use Authorization (EUA). This EUA will remain in effect (meaning this test can be used) for the duration of the COVID-19 declaration under Section 564(b)(1) of the Act, 21 U.S.C. section 360bbb-3(b)(1), unless the authorization is terminated or revoked.  Performed at [facility], [address], [place], [place] [address]   Blood Culture (routine x 2)     Status: Abnormal  Collection Time: 05/02/2021  4:47 PM   Specimen: BLOOD  Result Value Ref Range Status   Specimen Description   Final    BLOOD BLOOD LEFT HAND Performed at [facility], [address] [place], [place] [address]    Special Requests   Final    BOTTLES DRAWN AEROBIC AND ANAEROBIC Blood Culture results may not be optimal due to an inadequate volume of blood received in culture bottles Performed at [facility], [address] [place], [place] [address]    Culture  Setup Time   Final    GRAM POSITIVE COCCI AEROBIC BOTTLE ONLY Organism ID to follow CRITICAL RESULT CALLED TO, READ BACK BY AND VERIFIED WITH: BRANDON BEERS 05/17/21 1759 MU    Culture (A)  Final    STAPHYLOCOCCUS HOMINIS THE SIGNIFICANCE OF ISOLATING THIS ORGANISM FROM A SINGLE SET OF BLOOD CULTURES WHEN MULTIPLE SETS ARE DRAWN IS UNCERTAIN. PLEASE NOTIFY THE MICROBIOLOGY DEPARTMENT WITHIN ONE WEEK IF SPECIATION AND SENSITIVITIES ARE REQUIRED. Performed at [facility], [address], [place], [place] [address]    Report Status 05/18/2021 FINAL  Final  Blood Culture ID Panel (Reflexed)     Status: Abnormal   Collection Time: 05/11/2021  4:47 PM  Result Value Ref Range Status   Enterococcus faecalis NOT DETECTED NOT DETECTED  Final   Enterococcus Faecium NOT DETECTED NOT DETECTED Final   Listeria monocytogenes NOT DETECTED NOT DETECTED Final   Staphylococcus species DETECTED (A) NOT DETECTED Final    Comment: CRITICAL RESULT CALLED TO, READ BACK BY AND VERIFIED WITH: BRANDON BEERS 05/17/21 1759 MU    Staphylococcus aureus (BCID) NOT DETECTED NOT DETECTED Final   Staphylococcus epidermidis NOT DETECTED NOT DETECTED Final   Staphylococcus lugdunensis NOT DETECTED NOT DETECTED Final   Streptococcus species NOT DETECTED NOT DETECTED Final   Streptococcus agalactiae NOT DETECTED NOT DETECTED Final   Streptococcus pneumoniae NOT DETECTED NOT DETECTED Final   Streptococcus pyogenes NOT DETECTED NOT DETECTED Final   A.calcoaceticus-baumannii NOT DETECTED NOT DETECTED Final   Bacteroides fragilis NOT DETECTED NOT DETECTED Final   Enterobacterales NOT DETECTED NOT DETECTED Final   Enterobacter cloacae complex NOT DETECTED NOT DETECTED Final   Escherichia coli NOT DETECTED NOT DETECTED Final   Klebsiella aerogenes NOT DETECTED NOT DETECTED Final   Klebsiella oxytoca NOT DETECTED NOT DETECTED Final   Klebsiella pneumoniae NOT DETECTED NOT DETECTED Final   Proteus species NOT DETECTED NOT DETECTED Final   Salmonella species NOT DETECTED NOT DETECTED Final   Serratia marcescens NOT DETECTED NOT DETECTED Final   Haemophilus influenzae NOT DETECTED NOT DETECTED Final   Neisseria meningitidis NOT DETECTED NOT DETECTED Final   Pseudomonas aeruginosa NOT DETECTED NOT DETECTED Final   Stenotrophomonas maltophilia NOT DETECTED NOT DETECTED Final   Candida albicans NOT DETECTED NOT DETECTED Final   Candida auris NOT DETECTED NOT DETECTED Final   Candida glabrata NOT DETECTED NOT DETECTED Final   Candida krusei NOT DETECTED NOT DETECTED Final   Candida parapsilosis NOT DETECTED NOT DETECTED Final   Candida tropicalis NOT DETECTED NOT DETECTED Final   Cryptococcus neoformans/gattii NOT DETECTED NOT DETECTED Final    Comment:  Performed at [facility], [address] [place], [place] [address]  Blood Culture (routine x 2)     Status: None (Preliminary result)   Collection Time: 05/30/2021  4:48 PM   Specimen: BLOOD  Result Value Ref Range Status   Specimen Description BLOOD BLOOD LEFT FOREARM  Final   Special  Requests   Final    BOTTLES DRAWN AEROBIC AND ANAEROBIC Blood Culture results may not be optimal due to an inadequate volume of blood received in culture bottles   Culture   Final    NO GROWTH 3 DAYS Performed at [facility], [address] [place], [place] [address]    Report Status PENDING  Incomplete  Urine Culture     Status: Abnormal   Collection Time: 05/15/2021  6:37 PM   Specimen: In/Out Cath Urine  Result Value Ref Range Status   Specimen Description   Final    IN/OUT CATH URINE Performed at [facility], [address], [place], [place] [address]    Special Requests   Final    NONE Performed at [facility], [address] [place], [place] [address]    Culture MULTIPLE SPECIES PRESENT, SUGGEST RECOLLECTION (A)  Final   Report Status 05/18/2021 FINAL  Final  Culture, blood (routine x 2)     Status: None (Preliminary result)   Collection Time: 05/18/21  4:12 PM   Specimen: BLOOD  Result Value Ref Range Status   Specimen Description BLOOD LEFT ANTECUBITAL  Final   Special Requests   Final    BOTTLES DRAWN AEROBIC AND ANAEROBIC Blood Culture adequate volume   Culture   Final    NO GROWTH < 12 HOURS Performed at [facility], [address] [place], [place] [address]    Report Status PENDING  Incomplete  Culture, Respiratory w Gram Stain (tracheal aspirate)     Status: None (Preliminary result)   Collection Time: 05/18/21  4:20 PM   Specimen: Tracheal Aspirate; Respiratory  Result Value Ref Range Status   Specimen Description   Final    TRACHEAL ASPIRATE Performed at [facility], [address] [place], [place]  [address]    Special Requests   Final    Immunocompromised Performed at [facility], [place], [place] [address]    Gram Stain   Final    MODERATE WBC PRESENT,BOTH PMN AND MONONUCLEAR RARE GRAM NEGATIVE RODS    Culture   Final    NO GROWTH < 24 HOURS Performed at [facility], [place] [address] [place], [place] [address]    Report Status PENDING  Incomplete  Culture, blood (Routine X 2) w Reflex to ID Panel     Status: None (Preliminary result)   Collection Time: June 07, 2021  3:39 AM   Specimen: BLOOD  Result Value Ref Range Status   Specimen Description   Final    BLOOD A-LINE Performed at [facility], [address] [place], [place] [address]    Special Requests   Final    BOTTLES DRAWN AEROBIC AND ANAEROBIC Blood Culture results may not be optimal due to an excessive volume of blood received in culture bottles Performed at [facility], [address] [place], [place] [address]    Culture   Final    NO GROWTH < 12 HOURS Performed at [facility], [place] [address] [place], [place] [address]    Report Status PENDING  Incomplete          IMAGING    CT HEAD WO CONTRAST (5MM)  Result Date: 05/18/2021 CLINICAL DATA:  Mental status change, unknown cause EXAM: CT HEAD WITHOUT CONTRAST TECHNIQUE: Contiguous axial images were obtained from the base of the skull through the vertex without intravenous contrast. RADIATION DOSE REDUCTION: This exam was performed  according to the departmental dose-optimization program which includes automated exposure control, adjustment of the mA and/or kV according to patient size and/or use of iterative reconstruction technique. COMPARISON:  None. BRAIN: BRAIN Patchy and confluent areas of decreased attenuation are noted throughout the deep and periventricular white matter of the cerebral hemispheres bilaterally, compatible with chronic microvascular ischemic disease. Chronic right middle cerebral  artery territory infarction. No evidence of large-territorial acute infarction. No parenchymal hemorrhage. No mass lesion. No extra-axial collection. No mass effect or midline shift. No hydrocephalus. Basilar cisterns are patent. Vascular: No hyperdense vessel. Skull: No acute fracture or focal lesion. Sinuses/Orbits: Right mastoid mucosal thickening. Otherwise visualized paranasal sinuses and mastoid air cells are clear. The orbits are unremarkable. Other: None. IMPRESSION: No acute intracranial abnormality. Electronically Signed   By: Iven Finn M.D.   On: 05/18/2021 19:27   CT ABDOMEN PELVIS W CONTRAST  Result Date: 05/18/2021 CLINICAL DATA:  Sepsis sepsis.  Vomiting and diarrhea EXAM: CT ABDOMEN AND PELVIS WITH CONTRAST TECHNIQUE: Multidetector CT imaging of the abdomen and pelvis was performed using the standard protocol following bolus administration of intravenous contrast. RADIATION DOSE REDUCTION: This exam was performed according to the departmental dose-optimization program which includes automated exposure control, adjustment of the mA and/or kV according to patient size and/or use of iterative reconstruction technique. CONTRAST:  152m OMNIPAQUE IOHEXOL 300 MG/ML  SOLN COMPARISON:  CT 05/23/2021 FINDINGS: Lower chest: Mild basilar atelectasis.  No pneumonia Hepatobiliary: No focal hepatic lesion. Gallbladder mildly distended to 3.5 cm. No biliary duct dilatation. Pancreas: Pancreas is normal. No ductal dilatation. No pancreatic inflammation. Spleen: Normal spleen Adrenals/urinary tract: Adrenal glands and kidneys normal. Foley catheter within collapsed bladder. Stomach/Bowel: NG tube in stomach. Oral contrast in stomach. Oral contrast only progresses in the proximal small bowel. There are fluid-filled loops of small bowel which are air-fluid levels. Small bowel is mildly dilated up to 3.5 cm. Again demonstrated submucosal edema involving the colon. Mucosa enhancement throughout the colon. The  degree of inflammation is slightly increased from comparison exam. The transverse colon is mildly dilated to 6 cm compared to 4.9 cm. There is no pneumatosis. The colon is nearly entirely collapsed. Small amount free fluid along the pericolic gutters in the pelvis as well as the leaves of the mesentery. No pneumatosis or intraperitoneal free air. Vascular/Lymphatic: Abdominal aorta is normal caliber. No periportal or retroperitoneal adenopathy. No pelvic adenopathy. Reproductive: Uterus and adnexa unremarkable. Other: IVC is diminished in caliber. Musculoskeletal: No aggressive osseous lesion. IMPRESSION: 1. Progressive pancolitis. Extensive submucosal edema and mucosal enhancement. Differential remains infectious colitis including C difficile, inflammatory bowel disease, drug-eluting colitis, and less likely ischemic colitis. 2. Dilatation of the small bowel with air-fluid levels favors ileus over obstruction. 3. Increase in intraperitoneal free fluid. 4. IVC is small caliber suggesting hypovolemia or shock/sepsis. Electronically Signed   By: SSuzy BouchardM.D.   On: 05/18/2021 18:52   DG Chest Port 1 View  Result Date: 05/18/2021 CLINICAL DATA:  Encounter for central line placement. EXAM: PORTABLE CHEST 1 VIEW COMPARISON:  Chest radiograph dated May 16, 2021 FINDINGS: Endotracheal tube with distal tip at the level of clavicular heads. Feeding tube coursing below the diaphragm, however side port at the level of the GE junction. Right IJ access central line with distal tip at the cavoatrial junction. Low lung volumes. Loop recorder overlying the left hemithorax. No acute osseous abnormality. IMPRESSION: 1. Feeding tube with side port at the level of the GE junction, it needs to be  advanced 4-5 cm. 2. Right IJ access central line with distal tip at the cavoatrial junction. Endotracheal tube in satisfactory position. 3. Low lung volumes with bibasilar atelectasis. Electronically Signed   By: Keane Police  D.O.   On: 05/18/2021 14:06   DG ABD ACUTE 2+V W 1V CHEST  Result Date: 05/18/2021 CLINICAL DATA:  Abdominal pain and distention EXAM: DG ABDOMEN ACUTE WITH 1 VIEW CHEST COMPARISON:  CT done on 05/07/2021 FINDINGS: Cardiac size is within normal limits. Increased markings are seen in the left lower lung fields. There is poor inspiration. There are no signs of pulmonary edema. There is no pleural effusion or pneumothorax. Tip of endotracheal tube is 2 cm above the carina. Tip of enteric tube is seen in the fundus of the stomach. Side-port in the enteric tube is noted in the distal thoracic esophagus. There is moderate dilation of small-bowel loops measuring up to 4.5 cm. There is moderate gaseous distention of stomach. Gas is present in colon. Pelvis is not included in the images. IMPRESSION: Small patchy infiltrates are seen in the left lower lung fields suggesting atelectasis/pneumonia. Side-port in the enteric tube is seen in the lower thoracic esophagus. Enteric tube could be advanced 5-10 cm to place the side port within the stomach. There is moderate gaseous distention of small-bowel loops suggesting ileus. Electronically Signed   By: Elmer Picker M.D.   On: 05/18/2021 13:27     Nutrition Status:           Indwelling Urinary Catheter continued, requirement due to   Reason to continue Indwelling Urinary Catheter strict Intake/Output monitoring for hemodynamic instability   Central Line/ continued, requirement due to  Reason to continue [place] of central venous pressure or other hemodynamic parameters and poor IV access   Ventilator continued, requirement due to severe respiratory failure   Ventilator Sedation RASS 0 to -2      ASSESSMENT AND PLAN SYNOPSIS   Severe ACUTE Hypoxic and Hypercapnic Respiratory Failure -continue Full MV support -continue Bronchodilator Therapy -Wean Fio2 and PEEP as tolerated -will perform SAT/SBT when respiratory parameters  are met -VAP/VENT bundle implementation  Morbid obesity, possible OSA.   Will certainly impact respiratory mechanics, ventilator weaning Suspect will need to consider additional PEEP, possible extubation to BiPAP when appropriate to consider   NEUROLOGY - intubated and sedated - minimal sedation to achieve a RASS goal: -1 Wake up assessment pending Acute toxic metabolic encephalopathy, need for sedation Goal RASS -2 to -3   SHOCK-SEPSIS/HYPOVOLUMIC/CARDIOGENIC -use vasopressors to keep MAP>65 -follow ABG and LA -follow up cultures -emperic ABX -consider stress dose steroids -aggressive IV fluid resuscitation -treatment of fulminant C diff: Vanc PO, Flagyl IV, Vanc enemas -GI seeing, appreciate consult -despite aggressive antibiosis, patient remains septic and in shock state -Colon does not meet mega-colon dimension  -Tremendous leukocytosis, leukemoid reaction -Functionally should be treated as mega-colon with surgical source control -Family declines surgical intervention preferring med management only  CARDIAC ICU monitoring Shock state, volume and neo for map 60-65  ID -as above  -continue IV abx as prescibed -follow up cultures  GI As above GI PROPHYLAXIS as indicated  NUTRITIONAL STATUS DIET-->TF's as tolerated Constipation protocol as indicated   ENDO - will use ICU hypoglycemic\Hyperglycemia protocol if needed    RENAL/ELECTROLYTES Lab Results  Component Value Date   CREATININE 1.42 (H) 05/23/2021   BUN 23 (H) 05/23/2021   NA 134 (L) 2021-05-23   K 3.2 (L) 05/23/21   CL 105 23-May-2021  CO2 19 (L) 06/12/21   AKI -still volume loading, resuscitation -trend renal indices -follow labs as needed -replace as needed -pharmacy consultation and following    DVT/GI PRX ordered and assessed TRANSFUSIONS AS NEEDED MONITOR FSBS I Assessed the need for Labs I Assessed the need for Foley I Assessed the need for Central Venous Line Family  Discussion when available I Assessed the need for Mobilization I made an Assessment of medications to be adjusted accordingly Safety Risk assessment Completed  CASE DISCUSSED IN MULTIDISCIPLINARY ROUNDS WITH ICU TEAM   Critical Care Time devoted to patient care services described in this note is 45 minutes.   Overall, patient is critically ill, prognosis is guarded.  Patient with Multiorgan failure and at high risk for cardiac arrest and death.   Jagdeep Ancheta

## 2021-05-31 NOTE — Progress Notes (Signed)
[facility] PCCM  I updated the patient's mother by phone this morning and let her know that her overall condition is worse (worsening lactic acid, shock, oliguria).  She voiced understanding.  She says she will ask the patient's siblings to come to [place] as we anticipate Kenita will die within the next few days.    Goal of care is to continue this level of care but would not add further vasopressors or hemodialysis as we are unable to safely perform surgery.  Heber Mason, MD [place] PCCM Pager: [phone] Cell: [phone] After 7:00 pm call Elink  [phone]

## 2021-05-31 NOTE — Consult Note (Signed)
PHARMACY CONSULT NOTE  Pharmacy Consult for Electrolyte Monitoring and Replacement   Recent Labs: Potassium (mmol/L)  Date Value  05/29/2021 3.2 (L)   Magnesium (mg/dL)  Date Value  78/93/8101 2.1   Calcium (mg/dL)  Date Value  75/01/2584 6.6 (L)   Albumin (g/dL)  Date Value  27/78/2423 2.7 (L)   Phosphorus (mg/dL)  Date Value  53/61/4431 2.7   Sodium (mmol/L)  Date Value  05/23/2021 134 (L)   Assessment: 56 year old female with a past medical history of hypertension, CVA with hemiplegia and dysarthria, and obesity BIB EMS from SNF presenting with fever and altered mental status due to sepsis. Patient reported having worsening abdominal pain for 3-4 weeks and was noted to have vomiting and watery diarrhea during hospital stay. Rapid response was called on 05/18/21 for fast heart rate and hypotension where the patient was ultimately intubated and transferred to the CCU. Pharmacy has been consulted to monitor and replace electrolytes.   Nutrition: feeds per tube  Goal of Therapy:  Electrolytes within normal limits  Plan:  --Discontinue Kcl 40 mEq packet PO daily, pt NPO at this time  --Kcl 10 mEq IV x 3 doses --Recheck electrolytes with AM labs  [facility] 05/03/2021 7:29 AM

## 2021-05-31 NOTE — Progress Notes (Signed)
Patient HR up to 131 sustained. BP 90/50s via arterial line, MAP 66-67. Neo running at 350. [place] notified.

## 2021-05-31 NOTE — Progress Notes (Signed)
PCCM  Family (mother, children, ex-husband) wish for Terri Bender to be made comfort measures.  //Mindie Rawdon

## 2021-05-31 NOTE — Progress Notes (Signed)
[facility] PCCM  Called regarding worsening shock tachycardic Has lost 800cc from NG tube Received several liters of volume yesterday Has CVL in place Currently on neosynephrine On stress dose steroids now  On exam: Mechanically ventilated Hands, feet cool to touch, mottled Abdomen distended Sedated, minimally responsive to exam  Bedside echo attempted: could not get windows for any sort of meaningful interpretation  Impression: Septic shock from c. Diff with toxic megacolon> not a surgical candidate Consider cardiogenic component of shock Acute respiratory failure with hypoxemia Oliguric acute renal failure Hyperglycemia   Plan: Check COOX Check CVP Check lactic acid, troponin, random cortisol Check 12 lead EKG Give 1 L LR Add SSI q4h Continue neosynephrine titrated to MAP > 65 Add vasopressin  Overall prognosis is poor, will update family later this morning  CC time 30 minutes  Heber [place], MD [place] PCCM Pager: [phone] Cell: [phone] After 7:00 pm call Elink  [phone]

## 2021-05-31 NOTE — Progress Notes (Signed)
[facility] Progress Note Patient Name: EUNIE YAKE DOB: 01-01-1966 MRN: [id number]   Date of Service  June 02, 2021  HPI/Events of Note    eICU Interventions    Severe C.diff colitis with profound septic shock, cold/mottled extremities. Hypotensive despite Max dose Phenylephrine. Pt DNR, family leaning against surgery per RN report. Will add vasopressin for now, as HR already in the 130's. Poor prognosis without surgery. Would not escalate pressors if there are no plans for surgery.       Monique Gift N Ellary Casamento 06/02/2021, 3:25 AM

## 2021-05-31 NOTE — Progress Notes (Signed)
Patient blood sugar 305. Currently on Sodium Bicarb in D10 at 150/hr. Elink notified.

## 2021-05-31 NOTE — Progress Notes (Signed)
*  PRELIMINARY RESULTS* Echocardiogram 2D Echocardiogram has been performed.  Joanette Gula Defne Gerling 05/26/2021, 9:28 AM

## 2021-05-31 NOTE — Progress Notes (Signed)
Critical Lab values reported: Troponin 101, Lactic acid 7.4, WBC 78.4.

## 2021-05-31 NOTE — Progress Notes (Signed)
Pt passed away peacefully, on vent with family at bedside. Reinier, RN and this RN confirmed time of death at 12:11

## 2021-05-31 DEATH — deceased

## 2021-06-28 NOTE — Discharge Summary (Signed)
Physician Discharge Summary  Patient ID: Terri Bender MRN: [id number] DOB/AGE: 10/27/1965 56 y.o.  Admit date: 05/07/2021 Discharge date: 06/19/2021  Admission Diagnoses:  Discharge Diagnoses:  Principal Problem:   Pancolitis [facility] Active Problems:   Elevated liver function tests   Sepsis (HCC)   Hyponatremia   Hypothyroidism   Hypotension   C. difficile colitis   Tachycardia   Lactic acidosis   Leukocytosis   Discharged Condition: deceased  Hospital Course:  Transferred from medicine floor as a rapid response call hemodynamically unstable. She was immediately intubated and resuscitation with crystals started. There was significant colitis as the source. Family decided she would not wish for a colostomy or further aggressive measures and so she was allowed to be made comfort care.    Disposition:  Deceased  Allergies as of 2021/06/17       Reactions   Erythromycin Nausea And Vomiting   Septra [sulfamethoxazole-trimethoprim] Hives   Sulfamethoxazole Other (See Comments)   Trimethoprim Other (See Comments)        Medication List     ASK your doctor about these medications    acetaminophen 325 MG tablet Commonly known as: TYLENOL Take 650 mg by mouth every 6 (six) hours as needed for mild pain.   Acidophilus Caps capsule Take by mouth.   acyclovir ointment 5 % Commonly known as: ZOVIRAX Apply topically.   Ajovy 225 MG/1.5ML Soaj Generic drug: Fremanezumab-vfrm Inject into the skin.   aspirin 325 MG EC tablet Take 1 tablet (325 mg total) by mouth daily.   atorvastatin 40 MG tablet Commonly known as: LIPITOR Take 40 mg by mouth daily. Ask about: Which instructions should I use?   Baclofen 5 MG Tabs Take 5 mg by mouth daily.   Benefiber Powd Take by mouth.   Biofreeze 4 % Gel Generic drug: Menthol (Topical Analgesic) Apply 1 application topically 3 (three) times daily as needed.   Cholecalciferol 125 MCG (5000 UT) capsule Take 1 capsule  (5,000 Units total) by mouth daily.   cholecalciferol 25 MCG (1000 UNIT) tablet Commonly known as: VITAMIN D Take 1,000 Units by mouth daily.   clonazePAM 0.5 MG tablet Commonly known as: KLONOPIN Take 1 tablet (0.5 mg total) by mouth 2 (two) times daily as needed for anxiety.   cyanocobalamin 1000 MCG/ML injection Commonly known as: (VITAMIN B-12)   escitalopram 10 MG tablet Commonly known as: LEXAPRO Take 1 tablet (10 mg total) by mouth daily.   feeding supplement Liqd Take 237 mLs by mouth 2 (two) times daily between meals.   guaifenesin 100 MG/5ML syrup Commonly known as: ROBITUSSIN Take by mouth.   hydrocortisone cream 1 % Apply 1 application topically as directed.   hydroxypropyl methylcellulose / hypromellose 2.5 % ophthalmic solution Commonly known as: ISOPTO TEARS / GONIOVISC Place 2 drops into both eyes 4 (four) times daily.   levofloxacin 250 MG tablet Commonly known as: LEVAQUIN Check your outpatient doctor to see if you still need to take this antibiotic.   levothyroxine 25 MCG tablet Commonly known as: SYNTHROID Take 25 mcg by mouth daily before breakfast.   Lidocaine 4 % Ptch Apply 1 patch topically daily.   lisinopril 10 MG tablet Commonly known as: ZESTRIL Hold until outpatient followup due to intermittent low blood pressure.   Magnesium 250 MG Tabs Take 250 mg by mouth at bedtime.   melatonin 3 MG Tabs tablet Take by mouth.   metoprolol succinate 25 MG 24 hr tablet Commonly known as: TOPROL-XL Take 25 mg  by mouth daily.   Minerin Creme Crea Apply 1 application topically daily.   multivitamin with minerals tablet Take 1 tablet by mouth daily.   mupirocin ointment 2 % Commonly known as: BACTROBAN Apply 1 application topically daily.   nortriptyline 50 MG capsule Commonly known as: PAMELOR Take 50 mg by mouth at bedtime.   Nurtec 75 MG Tbdp Generic drug: Rimegepant Sulfate Take 75 mg by mouth every other day.   nystatin  powder Commonly known as: MYCOSTATIN/NYSTOP Apply 1 application topically 3 (three) times daily.   oxyCODONE 5 MG immediate release tablet Commonly known as: Oxy IR/ROXICODONE Take 5 mg by mouth every 8 (eight) hours as needed for pain.   pantoprazole 20 MG tablet Commonly known as: PROTONIX Take 20 mg by mouth daily.   potassium chloride 20 MEQ packet Commonly known as: KLOR-CON Take 40 mEq by mouth daily for 7 days.   pregabalin 100 MG capsule Commonly known as: LYRICA Take 75 mg by mouth 2 (two) times daily.   REFRESH P.M. OP Apply 1 application to eye at bedtime.   senna 8.6 MG tablet Commonly known as: SENOKOT Take 1 tablet by mouth at bedtime.   sodium chloride 1 g tablet Take by mouth.   thiamine 100 MG tablet Take by mouth.   torsemide 10 MG tablet Commonly known as: DEMADEX Hold until outpatient followup due to intermittent low blood pressure.   traZODone 50 MG tablet Commonly known as: DESYREL Take 50 mg by mouth at bedtime.   triamcinolone cream 0.1 % Commonly known as: KENALOG Apply topically.   venlafaxine XR 150 MG 24 hr capsule Commonly known as: EFFEXOR-XR Take 150 mg by mouth at bedtime.   venlafaxine XR 75 MG 24 hr capsule Commonly known as: EFFEXOR-XR Take 75 mg by mouth daily with breakfast.   venlafaxine 75 MG tablet Commonly known as: EFFEXOR Take 75 mg by mouth every morning.   Vyvanse 30 MG capsule Generic drug: lisdexamfetamine Take 30 mg by mouth daily.         Signed: Nelle Don 06/19/2021, 9:00 AM
